# Patient Record
Sex: Female | Born: 1950
Health system: Southern US, Community
[De-identification: ages and names within clinical notes are randomized; demographics above are authoritative.]

## PROBLEM LIST (undated history)

## (undated) DIAGNOSIS — N289 Disorder of kidney and ureter, unspecified: Secondary | ICD-10-CM

## (undated) DIAGNOSIS — E079 Disorder of thyroid, unspecified: Secondary | ICD-10-CM

## (undated) DIAGNOSIS — K8689 Other specified diseases of pancreas: Secondary | ICD-10-CM

## (undated) DIAGNOSIS — C801 Malignant (primary) neoplasm, unspecified: Secondary | ICD-10-CM

## (undated) DIAGNOSIS — K219 Gastro-esophageal reflux disease without esophagitis: Secondary | ICD-10-CM

## (undated) DIAGNOSIS — C679 Malignant neoplasm of bladder, unspecified: Secondary | ICD-10-CM

## (undated) DIAGNOSIS — I10 Essential (primary) hypertension: Secondary | ICD-10-CM

## (undated) HISTORY — PX: NASAL SEPTUM SURGERY: SHX37

## (undated) HISTORY — PX: REPLACEMENT TOTAL KNEE BILATERAL: SUR1225

## (undated) HISTORY — DX: Malignant neoplasm of bladder, unspecified: C67.9

## (undated) HISTORY — PX: ABDOMINAL HYSTERECTOMY: SHX81

## (undated) HISTORY — PX: CYSTOSCOPY: SUR368

## (undated) HISTORY — PX: KIDNEY SURGERY: SHX687

## (undated) HISTORY — PX: BREAST BIOPSY: SHX20

## (undated) HISTORY — PX: CHOLECYSTECTOMY: SHX55

---

## 2017-04-03 ENCOUNTER — Emergency Department (HOSPITAL_BASED_OUTPATIENT_CLINIC_OR_DEPARTMENT_OTHER)
Admission: EM | Admit: 2017-04-03 | Discharge: 2017-04-04 | Disposition: A | Discharge status: Home / Self Care | Payer: Medicare Other | Attending: Emergency Medicine | Admitting: Emergency Medicine

## 2017-04-03 ENCOUNTER — Emergency Department (HOSPITAL_BASED_OUTPATIENT_CLINIC_OR_DEPARTMENT_OTHER): Payer: Medicare Other

## 2017-04-03 ENCOUNTER — Other Ambulatory Visit: Payer: Self-pay

## 2017-04-03 ENCOUNTER — Encounter (HOSPITAL_BASED_OUTPATIENT_CLINIC_OR_DEPARTMENT_OTHER): Payer: Self-pay | Admitting: Emergency Medicine

## 2017-04-03 DIAGNOSIS — Z79899 Other long term (current) drug therapy: Secondary | ICD-10-CM | POA: Insufficient documentation

## 2017-04-03 DIAGNOSIS — Z8551 Personal history of malignant neoplasm of bladder: Secondary | ICD-10-CM | POA: Diagnosis not present

## 2017-04-03 DIAGNOSIS — R42 Dizziness and giddiness: Secondary | ICD-10-CM | POA: Diagnosis not present

## 2017-04-03 HISTORY — DX: Disorder of thyroid, unspecified: E07.9

## 2017-04-03 HISTORY — DX: Malignant (primary) neoplasm, unspecified: C80.1

## 2017-04-03 LAB — COMPREHENSIVE METABOLIC PANEL
ALT: 14 U/L (ref 14–54)
AST: 16 U/L (ref 15–41)
Albumin: 4.1 g/dL (ref 3.5–5.0)
Alkaline Phosphatase: 101 U/L (ref 38–126)
Anion gap: 8 (ref 5–15)
BUN: 26 mg/dL — ABNORMAL HIGH (ref 6–20)
CO2: 26 mmol/L (ref 22–32)
Calcium: 9.5 mg/dL (ref 8.9–10.3)
Chloride: 107 mmol/L (ref 101–111)
Creatinine, Ser: 1.3 mg/dL — ABNORMAL HIGH (ref 0.44–1.00)
GFR calc Af Amer: 49 mL/min — ABNORMAL LOW (ref >60)
GFR calc non Af Amer: 42 mL/min — ABNORMAL LOW (ref >60)
Glucose, Bld: 106 mg/dL — ABNORMAL HIGH (ref 65–99)
Potassium: 4.2 mmol/L (ref 3.5–5.1)
Sodium: 141 mmol/L (ref 135–145)
Total Bilirubin: 0.6 mg/dL (ref 0.3–1.2)
Total Protein: 7.1 g/dL (ref 6.5–8.1)

## 2017-04-03 LAB — CBC WITH DIFFERENTIAL/PLATELET
Basophils Absolute: 0.1 10*3/uL (ref 0.0–0.1)
Basophils Relative: 1 %
Eosinophils Absolute: 0.5 10*3/uL (ref 0.0–0.7)
Eosinophils Relative: 5 %
HCT: 44.5 % (ref 36.0–46.0)
Hemoglobin: 14.6 g/dL (ref 12.0–15.0)
Lymphocytes Relative: 18 %
Lymphs Abs: 1.9 10*3/uL (ref 0.7–4.0)
MCH: 29.1 pg (ref 26.0–34.0)
MCHC: 32.8 g/dL (ref 30.0–36.0)
MCV: 88.8 fL (ref 78.0–100.0)
Monocytes Absolute: 0.7 10*3/uL (ref 0.1–1.0)
Monocytes Relative: 6 %
Neutro Abs: 7.4 10*3/uL (ref 1.7–7.7)
Neutrophils Relative %: 70 %
Platelets: 285 10*3/uL (ref 150–400)
RBC: 5.01 MIL/uL (ref 3.87–5.11)
RDW: 13.5 % (ref 11.5–15.5)
WBC: 10.5 10*3/uL (ref 4.0–10.5)

## 2017-04-03 LAB — URINALYSIS, ROUTINE W REFLEX MICROSCOPIC
Bilirubin Urine: NEGATIVE
Glucose, UA: NEGATIVE mg/dL
Ketones, ur: NEGATIVE mg/dL
Nitrite: NEGATIVE
Protein, ur: NEGATIVE mg/dL
Specific Gravity, Urine: 1.02 (ref 1.005–1.030)
pH: 5.5 (ref 5.0–8.0)

## 2017-04-03 LAB — URINALYSIS, MICROSCOPIC (REFLEX)

## 2017-04-03 LAB — I-STAT CG4 LACTIC ACID, ED: Lactic Acid, Venous: 0.8 mmol/L (ref 0.5–1.9)

## 2017-04-03 MED ORDER — MECLIZINE HCL 25 MG PO TABS
25.0000 mg | ORAL_TABLET | Freq: Once | ORAL | Status: AC
  Administered 2017-04-03: 25 mg via ORAL
  Filled 2017-04-03: qty 1

## 2017-04-03 MED ORDER — SODIUM CHLORIDE 0.9 % IV BOLUS (SEPSIS)
1000.0000 mL | Freq: Once | INTRAVENOUS | Status: AC
  Administered 2017-04-03: 1000 mL via INTRAVENOUS

## 2017-04-03 NOTE — ED Triage Notes (Signed)
Patient called ems because she was lighthead and dizzy x 2 -3 days. The patient is having generalized weakness and N/V

## 2017-04-03 NOTE — ED Notes (Signed)
Lactic results given to EDP.

## 2017-04-03 NOTE — ED Provider Notes (Signed)
[facility] EMERGENCY DEPARTMENT Provider Note  CSN: [id number] Arrival date & time: 04/03/17 1802  Chief Complaint(s) Dizziness  HPI Shelia Cruz is a 67 y.o. female   The history is provided by the patient.  Dizziness  Quality:  Lightheadedness Severity:  Moderate Onset quality:  Gradual Duration:  10 days Timing:  Intermittent Progression:  Waxing and waning Chronicity:  New Context: head movement and standing up   Relieved by:  Being still Worsened by:  Movement, eye movement, turning head and standing up Associated symptoms: diarrhea (NB), nausea and vomiting (NBNB)   Associated symptoms: no blood in stool, no chest pain, no shortness of breath and no syncope   Risk factors comment:  H/o bladder and renal CA currently on Chemo   Past Medical History Past Medical History:  Diagnosis Date  . Cancer [facility])    bladder cancer  . Thyroid disease    There are no active problems to display for this patient.  Home Medication(s) Prior to Admission medications   Medication Sig Start Date End Date Taking? Authorizing Provider  levothyroxine (SYNTHROID, LEVOTHROID) 100 MCG tablet Take 175 mcg by mouth daily before breakfast.    Yes [provider]  meclizine (ANTIVERT) 25 MG tablet Take 1 tablet (25 mg total) by mouth 3 (three) times daily as needed for dizziness. 04/04/17   Fatima Blank, MD  ondansetron (ZOFRAN ODT) 4 MG disintegrating tablet Take 1 tablet (4 mg total) by mouth every 8 (eight) hours as needed for up to 3 days for nausea or vomiting. 04/04/17 04/07/17  Fatima Blank, MD                                                                                                                                    Past Surgical History Past Surgical History:  Procedure Laterality Date  . ABDOMINAL HYSTERECTOMY    . CHOLECYSTECTOMY    . CYSTOSCOPY    . KIDNEY SURGERY    . NASAL SEPTUM SURGERY    . REPLACEMENT TOTAL KNEE BILATERAL      Family History History reviewed. No pertinent family history.  Social History Social History   Tobacco Use  . Smoking status: Never Smoker  . Smokeless tobacco: Never Used  Substance Use Topics  . Alcohol use: No    Frequency: Never  . Drug use: No   Allergies Patient has no known allergies.  Review of Systems Review of Systems  Constitutional: Positive for fatigue. Negative for chills and fever.  HENT: Negative for congestion.   Respiratory: Negative for shortness of breath.   Cardiovascular: Negative for chest pain and syncope.  Gastrointestinal: Positive for diarrhea (NB), nausea and vomiting (NBNB). Negative for blood in stool.  Genitourinary: Positive for dysuria.  Neurological: Positive for dizziness.  All other systems are reviewed and are negative for acute change except as noted in the HPI  Physical Exam Vital Signs  I  have reviewed the triage vital signs BP 135/86 (BP Location: Right Arm)   Temp 99 F (37.2 C) (Oral)   Resp 18   Ht 5' 6.5" (1.689 m)   Wt 97.5 kg (215 lb)   SpO2 100%   BMI 34.18 kg/m   Physical Exam  Constitutional: She is oriented to person, place, and time. She appears well-developed and well-nourished. No distress.  HENT:  Head: Normocephalic and atraumatic.  Nose: Nose normal.  Eyes: Conjunctivae and EOM are normal. Pupils are equal, round, and reactive to light. Right eye exhibits no discharge. Left eye exhibits no discharge. No scleral icterus.  Neck: Normal range of motion. Neck supple.  Cardiovascular: Normal rate and regular rhythm. Exam reveals no gallop and no friction rub.  No murmur heard. Pulmonary/Chest: Effort normal and breath sounds normal. No stridor. No respiratory distress. She has no rales.  Abdominal: Soft. She exhibits no distension. There is no tenderness.  Musculoskeletal: She exhibits no edema or tenderness.  Neurological: She is alert and oriented to person, place, and time.  Mental Status:  Alert and  oriented to person, place, and time.  Attention and concentration normal.  Speech clear.  Recent memory is intact  Cranial Nerves:  II Visual Fields: Intact to confrontation. Visual fields intact. III, IV, VI: Pupils equal and reactive to light and near. Full eye movement without nystagmus  V Facial Sensation: Normal. No weakness of masticatory muscles  VII: No facial weakness or asymmetry  VIII Auditory Acuity: Grossly normal  IX/X: The uvula is midline; the palate elevates symmetrically  XI: Normal sternocleidomastoid and trapezius strength  XII: The tongue is midline. No atrophy or fasciculations.   Motor System: Muscle Strength: 5/5 and symmetric in the upper and lower extremities. No pronation or drift.  Muscle Tone: Tone and muscle bulk are normal in the upper and lower extremities.   Reflexes: No Clonus Coordination: Intact finger-to-nose, heel-to-shin. No tremor.  Sensation: Intact to light touch, and pinprick. .  Gait: wide gait  HINTS: Nystagmus: no Head impulse: abnormal with impulse to the left Skew: normal    Skin: Skin is warm and dry. No rash noted. She is not diaphoretic. No erythema.  Psychiatric: She has a normal mood and affect.  Vitals reviewed.   ED Results and Treatments Labs (all labs ordered are listed, but only abnormal results are displayed) Labs Reviewed  COMPREHENSIVE METABOLIC PANEL - Abnormal; Notable for the following components:      Result Value   Glucose, Bld 106 (*)    BUN 26 (*)    Creatinine, Ser 1.30 (*)    GFR calc non Af Amer 42 (*)    GFR calc Af Amer 49 (*)    All other components within normal limits  URINALYSIS, ROUTINE W REFLEX MICROSCOPIC - Abnormal; Notable for the following components:   Hgb urine dipstick LARGE (*)    Leukocytes, UA TRACE (*)    All other components within normal limits  URINALYSIS, MICROSCOPIC (REFLEX) - Abnormal; Notable for the following components:   Bacteria, UA FEW (*)    Squamous Epithelial /  LPF 0-5 (*)    All other components within normal limits  CBC WITH DIFFERENTIAL/PLATELET  I-STAT CG4 LACTIC ACID, ED  EKG  EKG Interpretation  Date/Time:  Monday April 03 2017 18:36:06 EST Ventricular Rate:  67 PR Interval:  164 QRS Duration: 78 QT Interval:  358 QTC Calculation: 378 R Axis:   69 Text Interpretation:  Normal sinus rhythm Cannot rule out Anterior infarct , age undetermined Abnormal ECG NO STEMI No old tracing to compare Confirmed by Addison Lank [phone]) on 04/03/2017 6:41:19 PM      Radiology Ct Head Wo Contrast  Result Date: 04/03/2017 CLINICAL DATA:  Patient with confusion, dizziness, headache. EXAM: CT HEAD WITHOUT CONTRAST TECHNIQUE: Contiguous axial images were obtained from the base of the skull through the vertex without intravenous contrast. COMPARISON:  None. FINDINGS: Brain: Ventricles and sulci are appropriate for patient's age. Periventricular and subcortical white matter hypodensity compatible with microvascular ischemic changes. No evidence for acute cortically based infarct, intracranial hemorrhage, mass lesion or mass-effect. Vascular: Unremarkable. Skull: Intact. Sinuses/Orbits: Paranasal sinuses are unremarkable. Mastoid air cells unremarkable. Other: None. IMPRESSION: No acute intracranial process. Chronic microvascular ischemic changes. Electronically Signed   By: Lovey Newcomer M.D.   On: 04/03/2017 20:38   Pertinent labs & imaging results that were available during my care of the patient were reviewed by me and considered in my medical decision making (see chart for details).  Medications Ordered in ED Medications  sodium chloride 0.9 % bolus 1,000 mL (0 mLs Intravenous Stopped 04/03/17 2219)  sodium chloride 0.9 % bolus 1,000 mL (1,000 mLs Intravenous New Bag/Given 04/03/17 2219)  meclizine (ANTIVERT) tablet 25 mg (25 mg Oral Given  04/03/17 2217)                                                                                                                                    Procedures Procedures  (including critical care time)  Medical Decision Making / ED Course I have reviewed the nursing notes for this encounter and the patient's prior records (if available in EHR or on provided paperwork).    Positional vertigo. Reassuring HINTS exam, but given pt's h/o cancer, CT head was obtained. CT head negative. Labs with mild AKI likely due lack of PO intake (similar to prior on review of records), otherwise reassuring w/o UTI.   Provided with IVF and meclizine resulting in significant improvement.  Low suspicion for CVA.  She was able to ambulate w/o complication.  The patient appears reasonably screened and/or stabilized for discharge and I doubt any other medical condition or other [facility] requiring further screening, evaluation, or treatment in the ED at this time prior to discharge.  The patient is safe for discharge with strict return precautions.   Final Clinical Impression(s) / ED Diagnoses Final diagnoses:  Vertigo    Disposition: Discharge  Condition: Good  I have discussed the results, Dx and Tx plan with the patient and daughter who expressed understanding and agree(s) with the plan. Discharge instructions discussed at great length. The patient and daughter were given  strict return precautions who verbalized understanding of the instructions. No further questions at time of discharge.    ED Discharge Orders        Ordered    meclizine (ANTIVERT) 25 MG tablet  3 times daily PRN     04/04/17 0002    ondansetron (ZOFRAN ODT) 4 MG disintegrating tablet  Every 8 hours PRN     04/04/17 0002       Follow Up: Primary care provider  Schedule an appointment as soon as possible for a visit  If symptoms do not improve or  worsen     This chart was dictated using voice recognition software.  Despite  best efforts to proofread,  errors can occur which can change the documentation meaning.   Fatima Blank, MD 04/04/17 0003

## 2017-04-04 MED ORDER — ONDANSETRON 4 MG PO TBDP: 4.0000 mg | ORAL_TABLET | Freq: Three times a day (TID) | ORAL | 0 refills | Status: AC | PRN

## 2017-04-04 MED ORDER — MECLIZINE HCL 25 MG PO TABS: 25.0000 mg | ORAL_TABLET | Freq: Three times a day (TID) | ORAL | 0 refills | Status: DC | PRN

## 2017-04-08 ENCOUNTER — Emergency Department (HOSPITAL_COMMUNITY): Payer: Medicare Other

## 2017-04-08 ENCOUNTER — Encounter (HOSPITAL_COMMUNITY): Payer: Self-pay | Admitting: *Deleted

## 2017-04-08 ENCOUNTER — Emergency Department (HOSPITAL_COMMUNITY)
Admission: EM | Admit: 2017-04-08 | Discharge: 2017-04-08 | Disposition: A | Discharge status: Home / Self Care | Payer: Medicare Other | Attending: Emergency Medicine | Admitting: Emergency Medicine

## 2017-04-08 DIAGNOSIS — Z96653 Presence of artificial knee joint, bilateral: Secondary | ICD-10-CM | POA: Insufficient documentation

## 2017-04-08 DIAGNOSIS — R1032 Left lower quadrant pain: Secondary | ICD-10-CM | POA: Diagnosis present

## 2017-04-08 DIAGNOSIS — K5792 Diverticulitis of intestine, part unspecified, without perforation or abscess without bleeding: Secondary | ICD-10-CM | POA: Insufficient documentation

## 2017-04-08 DIAGNOSIS — Z79899 Other long term (current) drug therapy: Secondary | ICD-10-CM | POA: Insufficient documentation

## 2017-04-08 HISTORY — DX: Disorder of kidney and ureter, unspecified: N28.9

## 2017-04-08 LAB — COMPREHENSIVE METABOLIC PANEL
ALT: 17 U/L (ref 14–54)
AST: 20 U/L (ref 15–41)
Albumin: 3.7 g/dL (ref 3.5–5.0)
Alkaline Phosphatase: 94 U/L (ref 38–126)
Anion gap: 11 (ref 5–15)
BUN: 21 mg/dL — ABNORMAL HIGH (ref 6–20)
CO2: 24 mmol/L (ref 22–32)
Calcium: 9.2 mg/dL (ref 8.9–10.3)
Chloride: 104 mmol/L (ref 101–111)
Creatinine, Ser: 1.45 mg/dL — ABNORMAL HIGH (ref 0.44–1.00)
GFR calc Af Amer: 43 mL/min — ABNORMAL LOW (ref >60)
GFR calc non Af Amer: 37 mL/min — ABNORMAL LOW (ref >60)
Glucose, Bld: 144 mg/dL — ABNORMAL HIGH (ref 65–99)
Potassium: 4.3 mmol/L (ref 3.5–5.1)
Sodium: 139 mmol/L (ref 135–145)
Total Bilirubin: 1.4 mg/dL — ABNORMAL HIGH (ref 0.3–1.2)
Total Protein: 6.3 g/dL — ABNORMAL LOW (ref 6.5–8.1)

## 2017-04-08 LAB — CBC
HCT: 42.3 % (ref 36.0–46.0)
Hemoglobin: 13.7 g/dL (ref 12.0–15.0)
MCH: 29.5 pg (ref 26.0–34.0)
MCHC: 32.4 g/dL (ref 30.0–36.0)
MCV: 91.2 fL (ref 78.0–100.0)
Platelets: 251 10*3/uL (ref 150–400)
RBC: 4.64 MIL/uL (ref 3.87–5.11)
RDW: 13.6 % (ref 11.5–15.5)
WBC: 14.1 10*3/uL — ABNORMAL HIGH (ref 4.0–10.5)

## 2017-04-08 LAB — URINALYSIS, ROUTINE W REFLEX MICROSCOPIC
Bacteria, UA: NONE SEEN
Bilirubin Urine: NEGATIVE
Glucose, UA: NEGATIVE mg/dL
Hgb urine dipstick: NEGATIVE
Ketones, ur: NEGATIVE mg/dL
Nitrite: NEGATIVE
Protein, ur: NEGATIVE mg/dL
RBC / HPF: NONE SEEN RBC/hpf (ref 0–5)
Specific Gravity, Urine: 1.013 (ref 1.005–1.030)
Squamous Epithelial / LPF: NONE SEEN
WBC, UA: NONE SEEN WBC/hpf (ref 0–5)
pH: 5 (ref 5.0–8.0)

## 2017-04-08 LAB — LIPASE, BLOOD: Lipase: 23 U/L (ref 11–51)

## 2017-04-08 MED ORDER — ONDANSETRON HCL 4 MG/2ML IJ SOLN
4.0000 mg | Freq: Once | INTRAMUSCULAR | Status: AC
  Administered 2017-04-08: 4 mg via INTRAVENOUS
  Filled 2017-04-08: qty 2

## 2017-04-08 MED ORDER — CIPROFLOXACIN HCL 500 MG PO TABS: 500.0000 mg | ORAL_TABLET | Freq: Two times a day (BID) | ORAL | 0 refills | Status: DC

## 2017-04-08 MED ORDER — IOPAMIDOL (ISOVUE-300) INJECTION 61%
INTRAVENOUS | Status: AC
  Administered 2017-04-08: 100 mL via INTRAVENOUS
  Filled 2017-04-08: qty 100

## 2017-04-08 MED ORDER — SODIUM CHLORIDE 0.9 % IV BOLUS (SEPSIS)
1000.0000 mL | Freq: Once | INTRAVENOUS | Status: AC
  Administered 2017-04-08: 1000 mL via INTRAVENOUS

## 2017-04-08 MED ORDER — CIPROFLOXACIN HCL 500 MG PO TABS
500.0000 mg | ORAL_TABLET | Freq: Once | ORAL | Status: AC
  Administered 2017-04-08: 500 mg via ORAL
  Filled 2017-04-08: qty 1

## 2017-04-08 MED ORDER — METRONIDAZOLE 500 MG PO TABS: 500.0000 mg | ORAL_TABLET | Freq: Two times a day (BID) | ORAL | 0 refills | Status: DC

## 2017-04-08 MED ORDER — TRAMADOL HCL 50 MG PO TABS: 50.0000 mg | ORAL_TABLET | Freq: Four times a day (QID) | ORAL | 0 refills | Status: DC | PRN

## 2017-04-08 MED ORDER — MORPHINE SULFATE (PF) 4 MG/ML IV SOLN
4.0000 mg | Freq: Once | INTRAVENOUS | Status: AC
  Administered 2017-04-08: 4 mg via INTRAVENOUS
  Filled 2017-04-08: qty 1

## 2017-04-08 MED ORDER — ONDANSETRON HCL 4 MG PO TABS: 4.0000 mg | ORAL_TABLET | Freq: Three times a day (TID) | ORAL | 0 refills | Status: DC | PRN

## 2017-04-08 MED ORDER — METRONIDAZOLE 500 MG PO TABS
500.0000 mg | ORAL_TABLET | Freq: Once | ORAL | Status: AC
  Administered 2017-04-08: 500 mg via ORAL
  Filled 2017-04-08: qty 1

## 2017-04-08 NOTE — ED Triage Notes (Signed)
To ED for eval of LLQ pain for past few days. States she is a chemo pain and was unable to get her treatment yesterday due to not feeling well - states she then saw pcp for n/v/d and told to come to ED for treatment of chemo side effects vs possible diverticulitis as hx of.

## 2017-04-08 NOTE — ED Notes (Signed)
Patient transported to CT 

## 2017-04-08 NOTE — ED Provider Notes (Signed)
[place] [facility] EMERGENCY DEPARTMENT Provider Note   CSN: [id number] Arrival date & time: 04/08/17  1129     History   Chief Complaint Chief Complaint  Patient presents with  . Abdominal Pain  . Emesis  . Diarrhea    HPI Shelia Cruz is a 67 y.o. female with a history of bladder cancer with metastasis to her kidneys currently undergoing chemotherapy by Dr. Rosana Hoes who presents the emergency department today for left lower quadrant abdominal pain with associated nausea/vomiting/diarrhea.  Patient states that she began having intermittent left lower quadrant abdominal pain since she was diagnosed with bladder cancer in 2017.  She was told at that time that it was her "bladder collapsing in on itself".  She notes that she started developing intermittent episodes of nausea and nonbilious, nonbloody emesis since November.  She notes that over the last 3 weeks that her pain in the left lower quadrant has become constant, as a dull, achy pain in her left lower quadrant without radiation.  She notes that over the last 3 weeks she has also started having 4 episodes of nonbloody diarrhea per day.  She notes that her stools are normally black in color due to iron supplements.  She was seen in the ED a few days ago for dehydration/vertigo felt better after fluids.  Repeat of the symptoms.  She saw her oncologist yesterday but did not undergo chemotherapy because she was not feeling well.  She feels her symptoms have worsened today in the left lower quadrant abdominal pain has become crampy in nature.  The pain is worse with movement and coughing.  She was seen at walk-in clinic and given a shot of pain medication as well as Zofran that helped with her symptoms.  Patient states that she was told to come here for further evaluation by a CT scan.  Patient notes that she has chronic dysuria since her chemotherapy in 2017 and is incontinent.  Her last catheterization was in December after  undergoing kidney surgery for cancer.  She has had no other change in urinary symptoms.  She denies any fever, chills, chest pain, shortness of breath, flank pain.  Patient states she has had a abdominal hysterectomy with bilateral oophorectomy in the past.  She has also had a cholecystectomy. LBM last night. Patient last colonoscopy 4 years ago which she reports having a few polyps removed and diverticulosis. Scheduled for next colonoscopy next year.   HPI  Past Medical History:  Diagnosis Date  . Cancer [facility])    bladder cancer  . Renal disorder   . Thyroid disease     There are no active problems to display for this patient.   Past Surgical History:  Procedure Laterality Date  . ABDOMINAL HYSTERECTOMY    . CHOLECYSTECTOMY    . CYSTOSCOPY    . KIDNEY SURGERY    . NASAL SEPTUM SURGERY    . REPLACEMENT TOTAL KNEE BILATERAL      OB History    No data available       Home Medications    Prior to Admission medications   Medication Sig Start Date End Date Taking? Authorizing Provider  levothyroxine (SYNTHROID, LEVOTHROID) 100 MCG tablet Take 175 mcg by mouth daily before breakfast.     [provider]  meclizine (ANTIVERT) 25 MG tablet Take 1 tablet (25 mg total) by mouth 3 (three) times daily as needed for dizziness. 04/04/17   Fatima Blank, MD    Family History No  family history on file.  Social History Social History   Tobacco Use  . Smoking status: Never Smoker  . Smokeless tobacco: Never Used  Substance Use Topics  . Alcohol use: No    Frequency: Never  . Drug use: No     Allergies   Patient has no known allergies.   Review of Systems Review of Systems  All other systems reviewed and are negative.    Physical Exam Updated Vital Signs BP 106/61   Pulse 65   Temp 98.7 F (37.1 C) (Oral)   Resp 16   SpO2 94%   Physical Exam  Constitutional: She appears well-developed and well-nourished.  HENT:  Head: Normocephalic and  atraumatic.  Right Ear: External ear normal.  Left Ear: External ear normal.  Nose: Nose normal.  Mouth/Throat: Uvula is midline, oropharynx is clear and moist and mucous membranes are normal. No tonsillar exudate.  Eyes: Pupils are equal, round, and reactive to light. Right eye exhibits no discharge. Left eye exhibits no discharge. No scleral icterus.  Neck: Trachea normal. Neck supple. No spinous process tenderness present. No neck rigidity. Normal range of motion present.  Cardiovascular: Normal rate, regular rhythm and intact distal pulses.  No murmur heard. Pulses:      Radial pulses are 2+ on the right side, and 2+ on the left side.       Dorsalis pedis pulses are 2+ on the right side, and 2+ on the left side.       Posterior tibial pulses are 2+ on the right side, and 2+ on the left side.  No lower extremity swelling or edema. Calves symmetric in size bilaterally.  Pulmonary/Chest: Effort normal and breath sounds normal. She exhibits no tenderness.  Abdominal: Soft. Bowel sounds are normal. She exhibits no distension. There is tenderness in the suprapubic area and left lower quadrant. There is no rigidity, no rebound, no guarding and no CVA tenderness.  Musculoskeletal: She exhibits no edema.  Lymphadenopathy:    She has no cervical adenopathy.  Neurological: She is alert.  Skin: Skin is warm and dry. No rash noted. She is not diaphoretic.  Psychiatric: She has a normal mood and affect.  Nursing note and vitals reviewed.    ED Treatments / Results  Labs (all labs ordered are listed, but only abnormal results are displayed) Labs Reviewed  COMPREHENSIVE METABOLIC PANEL - Abnormal; Notable for the following components:      Result Value   Glucose, Bld 144 (*)    BUN 21 (*)    Creatinine, Ser 1.45 (*)    Total Protein 6.3 (*)    Total Bilirubin 1.4 (*)    GFR calc non Af Amer 37 (*)    GFR calc Af Amer 43 (*)    All other components within normal limits  CBC - Abnormal;  Notable for the following components:   WBC 14.1 (*)    All other components within normal limits  URINALYSIS, ROUTINE W REFLEX MICROSCOPIC - Abnormal; Notable for the following components:   Leukocytes, UA TRACE (*)    All other components within normal limits  LIPASE, BLOOD    EKG  EKG Interpretation None       Radiology Ct Abdomen Pelvis W Contrast  Result Date: 04/08/2017 CLINICAL DATA:  67 year old female with history of left lower quadrant abdominal pain. History of bladder cancer with metastatic disease to the kidneys. Nausea, vomiting and diarrhea over the past several months, worsening in the past couple of days.  EXAM: CT ABDOMEN AND PELVIS WITH CONTRAST TECHNIQUE: Multidetector CT imaging of the abdomen and pelvis was performed using the standard protocol following bolus administration of intravenous contrast. CONTRAST:  175mL ISOVUE-300 IOPAMIDOL (ISOVUE-300) INJECTION 61% COMPARISON:  No priors. FINDINGS: Lower chest: Mild cardiomegaly. Hepatobiliary: No cystic or solid hepatic lesions. No intra or extrahepatic biliary ductal dilatation. Status post cholecystectomy. Pancreas: No pancreatic mass. No pancreatic ductal dilatation. No pancreatic or peripancreatic fluid or inflammatory changes. Spleen: Unremarkable. Adrenals/Urinary Tract: Bilateral kidneys and bilateral adrenal glands are normal in appearance. No hydroureteronephrosis. Urinary bladder is normal in appearance. Stomach/Bowel: The appearance of the stomach is normal. There is no pathologic dilatation of small bowel or colon. Numerous colonic diverticulae are noted, and in the region of the proximal sigmoid colon there are extensive surrounding inflammatory changes indicative of an acute diverticulitis. No discrete diverticular abscess or signs of frank perforation are identified at this time. Normal appendix. Vascular/Lymphatic: Aortic atherosclerosis, without evidence of aneurysm or dissection in the abdominal or pelvic  vasculature. No lymphadenopathy noted in the abdomen or pelvis. Reproductive: Status post hysterectomy. Ovaries are not confidently identified may be surgically absent or atrophic. Other: No significant volume of ascites.  No pneumoperitoneum. Musculoskeletal: There are no aggressive appearing lytic or blastic lesions noted in the visualized portions of the skeleton. IMPRESSION: 1. Acute sigmoid diverticulitis. No diverticular abscess or signs of frank perforation are noted at this time. 2. Aortic atherosclerosis. 3. Mild cardiomegaly. Electronically Signed   By: Vinnie Langton M.D.   On: 04/08/2017 17:18    Procedures Procedures (including critical care time)  Medications Ordered in ED Medications  morphine 4 MG/ML injection 4 mg (not administered)  ondansetron (ZOFRAN) injection 4 mg (4 mg Intravenous Given 04/08/17 1547)  sodium chloride 0.9 % bolus 1,000 mL (1,000 mLs Intravenous New Bag/Given 04/08/17 1548)  morphine 4 MG/ML injection 4 mg (4 mg Intravenous Given 04/08/17 1547)  iopamidol (ISOVUE-300) 61 % injection (100 mLs Intravenous Contrast Given 04/08/17 1623)     Initial Impression / Assessment and Plan / ED Course  I have reviewed the triage vital signs and the nursing notes.  Pertinent labs & imaging results that were available during my care of the patient were reviewed by me and considered in my medical decision making (see chart for details).      67 y.o. female with a history of bladder cancer with metastasis to her kidneys currently undergoing chemotherapy by Dr. Rosana Hoes who presenting with left lower quadrant abdominal pain with associated nausea/vomiting/diarrhea. Over the last 3 weeks patient notes LLQ abdominal pain has become more constant, and she started developing diarrhea 4 times per day. Patient missed chemo yesterday because she was not feeling well. Was told to come her for further evaluation. Patient was seen prior to my evaluation with blood work and Ct scan  ordered. Patient was given IVF, zofran and morphine for her symptoms.  On presentation the patient is without any fever, tachycardia, tachypnea, hypoxia or hypotension.  She appears non-ill and nonseptic appearing.  She does have left lower quadrant and suprapubic tenderness. No peritoneal signs. Patient has prior history of hysterectomy & oophorectomy bilaterally. No vaginal discharge or pain.  Do not feel she needs pelvic to evaluate for pathology.   Patient notes urinary symptoms at baseline. UA without evidence of UTI. Lipase wnl. Normal electrolytes. Creatinine and BUN slightly elevated from baseline and c/w dehydration. IVF being given. Glucose 144 but no anion gap acidosis. CBC with leukocytosis of 14.1 but otherwise  unremarkable. CT with acute sigmoid diverticulitis. No abscess or evidence of perforation.  Patient's pain and nausea control in the department.  She is able to tolerate p.o. fluids.  Patient given first dose of Cipro and Flagyl in the department.  Counseled risk of tendon injury while on ciprofloxacin and advised patient not to drink alcohol while taking Flagyl.  Patient will be discharged home on Cipro and Flagyl, short course of pain medication and Zofran for nausea.  I advised the patient to follow-up with PCP vs GI this week. Specific return precautions discussed. Time was given for all questions to be answered. The patient verbalized understanding and agreement with plan. The patient appears safe for discharge home.  Final Clinical Impressions(s) / ED Diagnoses   Final diagnoses:  Diverticulitis    ED Discharge Orders    None       Lorelle Gibbs 04/08/17 1804    Pattricia Boss, MD 04/09/17 2227

## 2017-04-08 NOTE — Discharge Instructions (Signed)
Your seen here today for left lower quadrant abdominal pain.  Your CT scan shows that you have diverticulitis.  Please see attached handout.  I am prescribing you ciprofloxacin and Flagyl for this. Please take all of your antibiotics until finished!   You may develop abdominal discomfort or diarrhea from the antibiotic.  You may help offset this with probiotics which you can buy or get in yogurt. Do not eat or take the probiotics until 2 hours after your antibiotic. Do not take your medicine if develop an itchy rash, swelling in your mouth or lips, or difficulty breathing. Note that the prophylaxis and can pose you at risk for tendon rupture.  Please do not lift heavy weight while on this medication.  Please do not drink alcohol while on Flagyl as this can make you very ill.  Please follow-up with your PCP versus GI in the following week for reevaluation.  For pain control you may take: 800mg  of ibuprofen (that is usually four 200mg  over the counter pills) up to 3 times a day (please take with food) and acetaminophen 975mg  (this is 3 normal strength, 325mg , over the counter pills) up to four times a day. Please do not take more than this. Do not drink alcohol or combine with other medications that have acetaminophen or Ibuprofen as an ingredient (Read the labels!).    For breakthrough pain you may take Ultram. Do not drink alcohol drive or operate heavy machinery when taking. You are being provided a prescription for opiates (also known as narcotics) for pain control on an ?as needed? basis.  Opiates can be addictive and should only be used when absolutely necessary for pain control when other alternatives do not work.  We recommend you only use them for the recommended amount of time and only as prescribed.  Please do not take with other sedative medications or alcohol.  Please do not drive, operate machinery, or make important decisions while taking opiates.  Please note that these medications can be addictive  and have high abuse potential.  Please keep these medications locked away from children, teenagers or any family members with history of substance abuse. Additionally, these medications may cause constipation - take over the counter stool softeners or add fiber to your diet to treat this (Metamucil, Psyllium Fiber, Colace, Miralax) Further refills will need to be obtained from your primary care doctor and will not be prescribed through the Emergency Department. You will test positive on most drug tests while taking this medication.

## 2017-04-08 NOTE — ED Notes (Signed)
ED Provider at bedside. 

## 2017-04-08 NOTE — ED Provider Notes (Signed)
MSE was initiated and I personally evaluated the patient and placed orders (if any) at  3:03 PM on January 71, 6390.  67 year old with LLQ abdominal pain. PMHx of bladder cancer with mets to kidneys. She reports feeling very poor over the past couple weeks. She has had N/V/D for several months but worse in the past couple days. She was seen in the ED a couple days ago for dehydration/vertigo and felt better after fluids. She saw her oncologist yesterday but was not given chemo because she was feeling so bad. She reports the LLQ pain has been going on for a while but much worse today. It is sharp and stabbing in the LLQ. It is constant and worse with movement and coughing. She was advised to come to the ED for a CT scan.  Vital signs reviewed are normal. On exam, heart is regular rate and rhythm. Lungs are CTA. Abdomen is soft and she is significantly tender in the LLQ. Orders for fluids, zofran, pain medicine, and CT of abdomen and pelvis were ordered.   The patient appears stable so that the remainder of the MSE may be completed by another provider.    Recardo Evangelist, PA-C 04/08/17 1503    Julianne Rice, MD 04/09/17 [phone]

## 2017-06-07 ENCOUNTER — Other Ambulatory Visit: Payer: Self-pay | Admitting: Urology

## 2017-06-07 DIAGNOSIS — R3989 Other symptoms and signs involving the genitourinary system: Secondary | ICD-10-CM

## 2017-06-07 DIAGNOSIS — R109 Unspecified abdominal pain: Secondary | ICD-10-CM

## 2017-06-13 ENCOUNTER — Ambulatory Visit
Admission: RE | Admit: 2017-06-13 | Discharge: 2017-06-13 | Disposition: A | Discharge status: Home / Self Care | Payer: Medicare Other | Source: Ambulatory Visit | Attending: Urology | Admitting: Urology

## 2017-06-13 DIAGNOSIS — R3989 Other symptoms and signs involving the genitourinary system: Secondary | ICD-10-CM

## 2017-06-13 DIAGNOSIS — R109 Unspecified abdominal pain: Secondary | ICD-10-CM

## 2017-06-28 ENCOUNTER — Other Ambulatory Visit: Payer: Self-pay | Admitting: Family Medicine

## 2017-06-28 DIAGNOSIS — E2839 Other primary ovarian failure: Secondary | ICD-10-CM

## 2017-06-28 DIAGNOSIS — Z1231 Encounter for screening mammogram for malignant neoplasm of breast: Secondary | ICD-10-CM

## 2017-08-30 ENCOUNTER — Other Ambulatory Visit: Payer: Self-pay | Admitting: Family Medicine

## 2017-08-30 DIAGNOSIS — N644 Mastodynia: Secondary | ICD-10-CM

## 2017-08-30 DIAGNOSIS — N6320 Unspecified lump in the left breast, unspecified quadrant: Secondary | ICD-10-CM

## 2017-08-31 ENCOUNTER — Other Ambulatory Visit: Payer: Self-pay | Admitting: Family Medicine

## 2017-08-31 DIAGNOSIS — N644 Mastodynia: Secondary | ICD-10-CM

## 2017-10-25 ENCOUNTER — Other Ambulatory Visit: Payer: Medicare Other

## 2018-02-26 ENCOUNTER — Ambulatory Visit
Admission: RE | Admit: 2018-02-26 | Discharge: 2018-02-26 | Disposition: A | Discharge status: Home / Self Care | Payer: Medicare Other | Source: Ambulatory Visit | Attending: Family Medicine | Admitting: Family Medicine

## 2018-02-26 ENCOUNTER — Other Ambulatory Visit: Payer: Medicare Other

## 2018-02-26 DIAGNOSIS — N644 Mastodynia: Secondary | ICD-10-CM

## 2018-02-26 DIAGNOSIS — E2839 Other primary ovarian failure: Secondary | ICD-10-CM

## 2018-06-21 ENCOUNTER — Emergency Department (HOSPITAL_BASED_OUTPATIENT_CLINIC_OR_DEPARTMENT_OTHER): Payer: Medicare Other

## 2018-06-21 ENCOUNTER — Emergency Department (HOSPITAL_BASED_OUTPATIENT_CLINIC_OR_DEPARTMENT_OTHER)
Admission: EM | Admit: 2018-06-21 | Discharge: 2018-06-22 | Disposition: A | Discharge status: Home / Self Care | Payer: Medicare Other | Attending: Emergency Medicine | Admitting: Emergency Medicine

## 2018-06-21 ENCOUNTER — Other Ambulatory Visit: Payer: Self-pay

## 2018-06-21 DIAGNOSIS — Z7982 Long term (current) use of aspirin: Secondary | ICD-10-CM | POA: Insufficient documentation

## 2018-06-21 DIAGNOSIS — Z79899 Other long term (current) drug therapy: Secondary | ICD-10-CM | POA: Insufficient documentation

## 2018-06-21 DIAGNOSIS — Y999 Unspecified external cause status: Secondary | ICD-10-CM | POA: Diagnosis not present

## 2018-06-21 DIAGNOSIS — S92352A Displaced fracture of fifth metatarsal bone, left foot, initial encounter for closed fracture: Secondary | ICD-10-CM | POA: Diagnosis not present

## 2018-06-21 DIAGNOSIS — Y9301 Activity, walking, marching and hiking: Secondary | ICD-10-CM | POA: Diagnosis not present

## 2018-06-21 DIAGNOSIS — S99912A Unspecified injury of left ankle, initial encounter: Secondary | ICD-10-CM | POA: Diagnosis present

## 2018-06-21 DIAGNOSIS — Y92480 Sidewalk as the place of occurrence of the external cause: Secondary | ICD-10-CM | POA: Insufficient documentation

## 2018-06-21 DIAGNOSIS — Z8551 Personal history of malignant neoplasm of bladder: Secondary | ICD-10-CM | POA: Diagnosis not present

## 2018-06-21 DIAGNOSIS — W101XXA Fall (on)(from) sidewalk curb, initial encounter: Secondary | ICD-10-CM | POA: Insufficient documentation

## 2018-06-21 MED ORDER — MORPHINE SULFATE (PF) 4 MG/ML IV SOLN
4.0000 mg | Freq: Once | INTRAVENOUS | Status: AC
  Administered 2018-06-21: 4 mg via INTRAVENOUS
  Filled 2018-06-21: qty 1

## 2018-06-21 MED ORDER — ONDANSETRON HCL 4 MG/2ML IJ SOLN
4.0000 mg | Freq: Once | INTRAMUSCULAR | Status: AC
  Administered 2018-06-21: 4 mg via INTRAVENOUS
  Filled 2018-06-21: qty 2

## 2018-06-21 NOTE — ED Notes (Signed)
Ice applied to left ankle.

## 2018-06-21 NOTE — ED Notes (Signed)
ED Provider at bedside. 

## 2018-06-21 NOTE — ED Triage Notes (Signed)
Pt Stepped off curb injured left ankle.  around 1800 tonight. Pt tried to take tramadol at home, pain increased EMS transport. Swelling. Left ankle. Pedal pulse positive.

## 2018-06-22 MED ORDER — HYDROCODONE-ACETAMINOPHEN 5-325 MG PO TABS
1.0000 | ORAL_TABLET | Freq: Once | ORAL | Status: AC
  Administered 2018-06-22: 02:00:00 1 via ORAL
  Filled 2018-06-22: qty 1

## 2018-06-22 MED ORDER — HYDROCODONE-ACETAMINOPHEN 5-325 MG PO TABS: 1.0000 | ORAL_TABLET | ORAL | 0 refills | Status: DC | PRN

## 2018-06-22 NOTE — ED Provider Notes (Signed)
[place] EMERGENCY DEPARTMENT Provider Note   CSN: [id number] Arrival date & time: 06/21/18  2256    History   Chief Complaint Chief Complaint  Patient presents with  . Ankle Injury    HPI Shelia Cruz is a 68 y.o. female.     Patient presents to the emergency department for evaluation of foot and ankle pain after injury.  She reports that she stepped off of a curb and twisted her ankle.  Her foot and ankle rolled up under her and she fell to the ground.  She was able to catch herself and did not injure anything else, but now is experiencing severe ankle and foot pain since the injury.  She cannot bear any weight.     Past Medical History:  Diagnosis Date  . Cancer [facility])    bladder cancer  . Renal disorder   . Thyroid disease     There are no active problems to display for this patient.   Past Surgical History:  Procedure Laterality Date  . ABDOMINAL HYSTERECTOMY    . BREAST BIOPSY Left    benign  . CHOLECYSTECTOMY    . CYSTOSCOPY    . KIDNEY SURGERY    . NASAL SEPTUM SURGERY    . REPLACEMENT TOTAL KNEE BILATERAL       OB History   No obstetric history on file.      Home Medications    Prior to Admission medications   Medication Sig Start Date End Date Taking? Authorizing Provider  aspirin EC 81 MG tablet Take by mouth.    [provider]  calcitRIOL (ROCALTROL) 0.5 MCG capsule Take by mouth.    [provider]  Cholecalciferol (VITAMIN D3) 50 MCG (2000 UT) capsule Take 1 tablet by mouth daily on Sunday, Tuesday, Thursday and Saturday    [provider]  cycloSPORINE (RESTASIS) 0.05 % ophthalmic emulsion Place 1 drop into both eyes 2 times daily.    [provider]  DULoxetine (CYMBALTA) 30 MG capsule Take by mouth.    [provider]  fluticasone (FLONASE) 50 MCG/ACT nasal spray Place into the nose.    [provider]  levothyroxine (SYNTHROID, LEVOTHROID) 100 MCG tablet Take 175 mcg  by mouth daily before breakfast.     [provider]  losartan (COZAAR) 100 MG tablet Take by mouth.    [provider]  meclizine (ANTIVERT) 25 MG tablet Take 1 tablet (25 mg total) by mouth 3 (three) times daily as needed for dizziness. 04/04/17   Fatima Blank, MD  metoprolol succinate (TOPROL-XL) 50 MG 24 hr tablet Take by mouth.    [provider]  metroNIDAZOLE (FLAGYL) 500 MG tablet Take 1 tablet (500 mg total) by mouth 2 (two) times daily with a meal. DO NOT CONSUME ALCOHOL WHILE TAKING THIS MEDICATION. 04/08/17   Maczis, Barth Kirks, PA-C  ondansetron (ZOFRAN) 4 MG tablet Take 1 tablet (4 mg total) by mouth every 8 (eight) hours as needed for nausea or vomiting. 04/08/17   Maczis, Barth Kirks, PA-C  rOPINIRole (REQUIP) 2 MG tablet Take by mouth.    [provider]  traMADol (ULTRAM) 50 MG tablet Take 1 tablet (50 mg total) by mouth every 6 (six) hours as needed. 04/08/17   Maczis, Barth Kirks, PA-C  traZODone (DESYREL) 150 MG tablet Take by mouth.    [provider]    Family History No family history on file.  Social History Social History   Tobacco Use  .  Smoking status: Never Smoker  . Smokeless tobacco: Never Used  Substance Use Topics  . Alcohol use: No    Frequency: Never  . Drug use: No     Allergies   Iodinated diagnostic agents; Sulfamethoxazole-trimethoprim; and Ciprofloxacin   Review of Systems Review of Systems  Musculoskeletal: Positive for arthralgias.  Neurological: Positive for numbness (toes).     Physical Exam Updated Vital Signs BP 129/72 (BP Location: Left Arm)   Pulse 72   Temp 98.4 F (36.9 C) (Oral)   Resp 18   Ht 5\' 6"  (1.676 m)   Wt 86.6 kg   SpO2 96%   BMI 30.83 kg/m   Physical Exam Constitutional:      Appearance: Normal appearance.  HENT:     Head: Normocephalic and atraumatic.  Neck:     Musculoskeletal: Normal range of motion and neck supple.  Cardiovascular:     Rate and  Rhythm: Normal rate and regular rhythm.     Pulses:          Dorsalis pedis pulses are 1+ on the left side.  Pulmonary:     Effort: Pulmonary effort is normal.     Breath sounds: Normal breath sounds.  Abdominal:     Tenderness: There is no abdominal tenderness.  Musculoskeletal:     Left ankle: She exhibits swelling. She exhibits no ecchymosis, no deformity, no laceration and normal pulse. Tenderness. Achilles tendon normal.     Left foot: Bony tenderness present. No deformity.       Feet:  Skin:    Findings: No abrasion, bruising, ecchymosis, erythema or laceration.  Neurological:     Mental Status: She is alert.     Sensory: Sensation is intact.     Motor: Motor function is intact.      ED Treatments / Results  Labs (all labs ordered are listed, but only abnormal results are displayed) Labs Reviewed - No data to display  EKG None  Radiology Dg Ankle Complete Left  Result Date: 06/22/2018 CLINICAL DATA:  68 year old female with trauma to the left foot and ankle. EXAM: LEFT ANKLE COMPLETE - 3+ VIEW; LEFT FOOT - COMPLETE 3+ VIEW COMPARISON:  None. FINDINGS: There is a minimally displaced oblique fracture of the fifth metatarsal. No other acute fracture identified. There is no dislocation. The bones are osteopenic. The ankle mortise is intact. Evidence of prior surgery of the talus with imbedded hardware. There is mild soft tissue swelling of the lateral ankle. IMPRESSION: Minimally displaced oblique fracture of the fifth metatarsal. Electronically Signed   By: Anner Crete M.D.   On: 06/22/2018 00:04   Dg Foot Complete Left  Result Date: 06/22/2018 CLINICAL DATA:  68 year old female with trauma to the left foot and ankle. EXAM: LEFT ANKLE COMPLETE - 3+ VIEW; LEFT FOOT - COMPLETE 3+ VIEW COMPARISON:  None. FINDINGS: There is a minimally displaced oblique fracture of the fifth metatarsal. No other acute fracture identified. There is no dislocation. The bones are osteopenic. The  ankle mortise is intact. Evidence of prior surgery of the talus with imbedded hardware. There is mild soft tissue swelling of the lateral ankle. IMPRESSION: Minimally displaced oblique fracture of the fifth metatarsal. Electronically Signed   By: Anner Crete M.D.   On: 06/22/2018 00:04    Procedures Procedures (including critical care time)  Medications Ordered in ED Medications  morphine 4 MG/ML injection 4 mg (4 mg Intravenous Given 06/21/18 2342)  ondansetron (ZOFRAN) injection 4 mg (4 mg Intravenous Given  06/21/18 2342)     Initial Impression / Assessment and Plan / ED Course  I have reviewed the triage vital signs and the nursing notes.  Pertinent labs & imaging results that were available during my care of the patient were reviewed by me and considered in my medical decision making (see chart for details).        Patient presents with left foot and ankle injury after a fall.  Ankle x-ray was unremarkable but left foot x-ray does show oblique fracture of the fifth metatarsal.  Patient placed in splint, crutches, analgesia.  She will follow-up with her orthopedic surgeon.  Final Clinical Impressions(s) / ED Diagnoses   Final diagnoses:  Closed displaced fracture of fifth metatarsal bone of left foot, initial encounter    ED Discharge Orders    None       Pollina, Gwenyth Allegra, MD 06/22/18 0007

## 2018-06-22 NOTE — ED Notes (Signed)
[facility] notified - patient ready to be transported

## 2018-08-12 ENCOUNTER — Other Ambulatory Visit: Payer: Self-pay

## 2018-08-12 ENCOUNTER — Emergency Department (HOSPITAL_COMMUNITY): Payer: Medicare Other

## 2018-08-12 ENCOUNTER — Observation Stay (HOSPITAL_COMMUNITY)
Admission: EM | Admit: 2018-08-12 | Discharge: 2018-08-14 | Disposition: A | Discharge status: Home / Self Care | Payer: Medicare Other | Attending: Internal Medicine | Admitting: Internal Medicine

## 2018-08-12 ENCOUNTER — Encounter (HOSPITAL_COMMUNITY): Payer: Self-pay | Admitting: Emergency Medicine

## 2018-08-12 DIAGNOSIS — Z96653 Presence of artificial knee joint, bilateral: Secondary | ICD-10-CM | POA: Diagnosis not present

## 2018-08-12 DIAGNOSIS — Z79890 Hormone replacement therapy (postmenopausal): Secondary | ICD-10-CM | POA: Diagnosis not present

## 2018-08-12 DIAGNOSIS — Z7951 Long term (current) use of inhaled steroids: Secondary | ICD-10-CM | POA: Diagnosis not present

## 2018-08-12 DIAGNOSIS — I700 Atherosclerosis of aorta: Secondary | ICD-10-CM | POA: Diagnosis not present

## 2018-08-12 DIAGNOSIS — R0602 Shortness of breath: Secondary | ICD-10-CM | POA: Diagnosis not present

## 2018-08-12 DIAGNOSIS — Z8551 Personal history of malignant neoplasm of bladder: Secondary | ICD-10-CM | POA: Diagnosis not present

## 2018-08-12 DIAGNOSIS — R0902 Hypoxemia: Secondary | ICD-10-CM | POA: Diagnosis not present

## 2018-08-12 DIAGNOSIS — R0600 Dyspnea, unspecified: Secondary | ICD-10-CM

## 2018-08-12 DIAGNOSIS — C801 Malignant (primary) neoplasm, unspecified: Secondary | ICD-10-CM | POA: Diagnosis present

## 2018-08-12 DIAGNOSIS — N183 Chronic kidney disease, stage 3 (moderate): Secondary | ICD-10-CM | POA: Insufficient documentation

## 2018-08-12 DIAGNOSIS — I129 Hypertensive chronic kidney disease with stage 1 through stage 4 chronic kidney disease, or unspecified chronic kidney disease: Secondary | ICD-10-CM | POA: Diagnosis not present

## 2018-08-12 DIAGNOSIS — Z85528 Personal history of other malignant neoplasm of kidney: Secondary | ICD-10-CM | POA: Diagnosis not present

## 2018-08-12 DIAGNOSIS — Z7982 Long term (current) use of aspirin: Secondary | ICD-10-CM | POA: Diagnosis not present

## 2018-08-12 DIAGNOSIS — Z79899 Other long term (current) drug therapy: Secondary | ICD-10-CM | POA: Diagnosis not present

## 2018-08-12 DIAGNOSIS — E039 Hypothyroidism, unspecified: Secondary | ICD-10-CM | POA: Insufficient documentation

## 2018-08-12 DIAGNOSIS — Z905 Acquired absence of kidney: Secondary | ICD-10-CM | POA: Insufficient documentation

## 2018-08-12 DIAGNOSIS — K219 Gastro-esophageal reflux disease without esophagitis: Secondary | ICD-10-CM | POA: Diagnosis present

## 2018-08-12 DIAGNOSIS — Z9049 Acquired absence of other specified parts of digestive tract: Secondary | ICD-10-CM | POA: Diagnosis not present

## 2018-08-12 DIAGNOSIS — R0789 Other chest pain: Secondary | ICD-10-CM | POA: Diagnosis not present

## 2018-08-12 DIAGNOSIS — Z1159 Encounter for screening for other viral diseases: Secondary | ICD-10-CM | POA: Insufficient documentation

## 2018-08-12 DIAGNOSIS — I10 Essential (primary) hypertension: Secondary | ICD-10-CM | POA: Diagnosis present

## 2018-08-12 DIAGNOSIS — R0609 Other forms of dyspnea: Secondary | ICD-10-CM

## 2018-08-12 HISTORY — DX: Gastro-esophageal reflux disease without esophagitis: K21.9

## 2018-08-12 HISTORY — DX: Essential (primary) hypertension: I10

## 2018-08-12 HISTORY — DX: Other specified diseases of pancreas: K86.89

## 2018-08-12 LAB — BASIC METABOLIC PANEL
Anion gap: 12 (ref 5–15)
BUN: 37 mg/dL — ABNORMAL HIGH (ref 8–23)
CO2: 22 mmol/L (ref 22–32)
Calcium: 9.7 mg/dL (ref 8.9–10.3)
Chloride: 103 mmol/L (ref 98–111)
Creatinine, Ser: 2.22 mg/dL — ABNORMAL HIGH (ref 0.44–1.00)
GFR calc Af Amer: 26 mL/min — ABNORMAL LOW (ref >60)
GFR calc non Af Amer: 22 mL/min — ABNORMAL LOW (ref >60)
Glucose, Bld: 87 mg/dL (ref 70–99)
Potassium: 4.4 mmol/L (ref 3.5–5.1)
Sodium: 137 mmol/L (ref 135–145)

## 2018-08-12 LAB — TROPONIN I: Troponin I: <0.03 ng/mL (ref <0.03)

## 2018-08-12 LAB — CBC
HCT: 44 % (ref 36.0–46.0)
Hemoglobin: 14.1 g/dL (ref 12.0–15.0)
MCH: 29.2 pg (ref 26.0–34.0)
MCHC: 32 g/dL (ref 30.0–36.0)
MCV: 91.1 fL (ref 80.0–100.0)
Platelets: 342 10*3/uL (ref 150–400)
RBC: 4.83 MIL/uL (ref 3.87–5.11)
RDW: 12.1 % (ref 11.5–15.5)
WBC: 9.4 10*3/uL (ref 4.0–10.5)
nRBC: 0 % (ref 0.0–0.2)

## 2018-08-12 LAB — D-DIMER, QUANTITATIVE: D-Dimer, Quant: 0.3 ug/mL-FEU (ref 0.00–0.50)

## 2018-08-12 MED ORDER — SODIUM CHLORIDE 0.9% FLUSH
3.0000 mL | Freq: Once | INTRAVENOUS | Status: AC
  Administered 2018-08-13: 3 mL via INTRAVENOUS

## 2018-08-12 MED ORDER — LACTATED RINGERS IV BOLUS
1000.0000 mL | Freq: Once | INTRAVENOUS | Status: AC
  Administered 2018-08-13: 1000 mL via INTRAVENOUS

## 2018-08-12 NOTE — ED Triage Notes (Signed)
Pt in POV, reports cough/sore throat, back pain with radiation into chest X 3 weeks. Pt has hx of Bladder Cancer. Sent here by PCP for further eval. VSS.

## 2018-08-12 NOTE — ED Provider Notes (Signed)
[place] EMERGENCY DEPARTMENT Provider Note   CSN: [id number] Arrival date & time: 08/12/18  1934    History   Chief Complaint Chief Complaint  Patient presents with   Shortness of Breath    HPI Channon Brougher is a 68 y.o. female.  HPI 68 year old female with a history of bladder cancer with metastasis to the kidneys presents with shortness of breath.  Patient had a left nephroureterectomy on 05/16/18.  She states that since that time she has had progressive shortness of breath is worse with exertion.  She has associated chest tightness, cough, sore throat, and chills. Denies fever.  Chest pain typically occurs with coughing.  No hemoptysis.  No nausea, vomiting, abdominal pain, or urinary symptoms.  Denies lower extremity swelling or erythema.  She states that symptoms are now limiting her ADLs.  Past Medical History:  Diagnosis Date   Cancer [facility]    bladder cancer   Renal disorder    Thyroid disease     There are no active problems to display for this patient.   Past Surgical History:  Procedure Laterality Date   ABDOMINAL HYSTERECTOMY     BREAST BIOPSY Left    benign   CHOLECYSTECTOMY     CYSTOSCOPY     KIDNEY SURGERY     NASAL SEPTUM SURGERY     REPLACEMENT TOTAL KNEE BILATERAL       OB History   No obstetric history on file.      Home Medications    Prior to Admission medications   Medication Sig Start Date End Date Taking? Authorizing Provider  aspirin EC 81 MG tablet Take by mouth.    [provider]  calcitRIOL (ROCALTROL) 0.5 MCG capsule Take by mouth.    [provider]  Cholecalciferol (VITAMIN D3) 50 MCG (2000 UT) capsule Take 1 tablet by mouth daily on Sunday, Tuesday, Thursday and Saturday    [provider]  cycloSPORINE (RESTASIS) 0.05 % ophthalmic emulsion Place 1 drop into both eyes 2 times daily.    [provider]  DULoxetine (CYMBALTA) 30 MG capsule Take by mouth.     [provider]  fluticasone (FLONASE) 50 MCG/ACT nasal spray Place into the nose.    [provider]  HYDROcodone-acetaminophen (NORCO/VICODIN) 5-325 MG tablet Take 1-2 tablets by mouth every 4 (four) hours as needed for moderate pain. 06/22/18   Orpah Greek, MD  levothyroxine (SYNTHROID, LEVOTHROID) 100 MCG tablet Take 175 mcg by mouth daily before breakfast.     [provider]  losartan (COZAAR) 100 MG tablet Take by mouth.    [provider]  meclizine (ANTIVERT) 25 MG tablet Take 1 tablet (25 mg total) by mouth 3 (three) times daily as needed for dizziness. 04/04/17   Fatima Blank, MD  metoprolol succinate (TOPROL-XL) 50 MG 24 hr tablet Take by mouth.    [provider]  metroNIDAZOLE (FLAGYL) 500 MG tablet Take 1 tablet (500 mg total) by mouth 2 (two) times daily with a meal. DO NOT CONSUME ALCOHOL WHILE TAKING THIS MEDICATION. 04/08/17   Maczis, Barth Kirks, PA-C  ondansetron (ZOFRAN) 4 MG tablet Take 1 tablet (4 mg total) by mouth every 8 (eight) hours as needed for nausea or vomiting. 04/08/17   Maczis, Barth Kirks, PA-C  rOPINIRole (REQUIP) 2 MG tablet Take by mouth.    [provider]  traMADol (ULTRAM) 50 MG tablet Take 1 tablet (50 mg total) by mouth every 6 (six) hours as  needed. 04/08/17   Maczis, Barth Kirks, PA-C  traZODone (DESYREL) 150 MG tablet Take by mouth.    [provider]    Family History No family history on file.  Social History Social History   Tobacco Use   Smoking status: Never Smoker   Smokeless tobacco: Never Used  Substance Use Topics   Alcohol use: No    Frequency: Never   Drug use: No     Allergies   Iodinated diagnostic agents; Sulfamethoxazole-trimethoprim; and Ciprofloxacin   Review of Systems Review of Systems  Constitutional: Positive for chills. Negative for fever.  HENT: Positive for sore throat. Negative for ear pain.   Eyes: Negative for pain and visual  disturbance.  Respiratory: Positive for cough and shortness of breath.   Cardiovascular: Positive for chest pain. Negative for palpitations.  Gastrointestinal: Negative for abdominal pain and vomiting.  Genitourinary: Negative for dysuria and hematuria.  Musculoskeletal: Negative for arthralgias and back pain.  Skin: Negative for color change and rash.  Neurological: Negative for seizures and syncope.  All other systems reviewed and are negative.    Physical Exam Updated Vital Signs BP 119/75 (BP Location: Right Arm)    Pulse 63    Temp 98.5 F (36.9 C) (Oral)    Resp 16    Ht 5\' 6"  (1.676 m)    Wt 86.2 kg    SpO2 95%    BMI 30.67 kg/m   Physical Exam Vitals signs and nursing note reviewed.  Constitutional:      General: She is not in acute distress.    Appearance: She is well-developed.  HENT:     Head: Normocephalic and atraumatic.  Eyes:     Conjunctiva/sclera: Conjunctivae normal.  Neck:     Musculoskeletal: Neck supple.  Cardiovascular:     Rate and Rhythm: Normal rate and regular rhythm.     Heart sounds: No murmur.  Pulmonary:     Effort: Pulmonary effort is normal. No respiratory distress.     Breath sounds: Normal breath sounds.  Abdominal:     Palpations: Abdomen is soft.     Tenderness: There is no abdominal tenderness.  Musculoskeletal: Normal range of motion.        General: No swelling or tenderness.     Comments: Walking boot on the left lower extremity  Skin:    General: Skin is warm and dry.  Neurological:     General: No focal deficit present.     Mental Status: She is alert and oriented to person, place, and time.      ED Treatments / Results  Labs (all labs ordered are listed, but only abnormal results are displayed) Labs Reviewed  BASIC METABOLIC PANEL - Abnormal; Notable for the following components:      Result Value   BUN 37 (*)    Creatinine, Ser 2.22 (*)    GFR calc non Af Amer 22 (*)    GFR calc Af Amer 26 (*)    All other  components within normal limits  CBC  TROPONIN I  TROPONIN I  BRAIN NATRIURETIC PEPTIDE  D-DIMER, QUANTITATIVE (NOT AT [facility])  URINALYSIS, ROUTINE W REFLEX MICROSCOPIC    EKG EKG Interpretation  Date/Time:  Sunday Aug 12 2018 19:43:25 EDT Ventricular Rate:  70 PR Interval:  176 QRS Duration: 72 QT Interval:  356 QTC Calculation: 384 R Axis:   46 Text Interpretation:  Normal sinus rhythm Cannot rule out Anteroseptal infarct , age undetermined Abnormal ECG No significant  change since last tracing Confirmed by Addison Lank [phone]) on 08/12/2018 11:21:25 PM   Radiology Dg Chest 2 View  Result Date: 08/12/2018 CLINICAL DATA:  History of renal carcinoma with shortness of breath EXAM: CHEST - 2 VIEW COMPARISON:  None. FINDINGS: Cardiac shadow is within normal limits. The lungs are clear bilaterally. Degenerative changes of the thoracic spine are noted. IMPRESSION: No active cardiopulmonary disease. Electronically Signed   By: Inez Catalina M.D.   On: 08/12/2018 20:34   Ct Chest Wo Contrast  Result Date: 08/12/2018 CLINICAL DATA:  Chronic cough and sore throat EXAM: CT CHEST WITHOUT CONTRAST TECHNIQUE: Multidetector CT imaging of the chest was performed following the standard protocol without IV contrast. COMPARISON:  Film from earlier in the same day. FINDINGS: Cardiovascular: Thoracic aorta demonstrates mild atherosclerotic change without aneurysmal dilatation. No cardiac enlargement is seen. Mediastinum/Nodes: Thoracic inlet is within normal limits. No hilar or mediastinal adenopathy is seen. The esophagus is within normal limits. Lungs/Pleura: Lungs are well aerated bilaterally. No focal infiltrate or sizable effusion is seen. No parenchymal nodules are noted. Upper Abdomen: Changes consistent with prior cholecystectomy are noted. The remainder of the upper abdomen is within normal limits. Musculoskeletal: Degenerative changes of the thoracic spine are noted. IMPRESSION: No acute abnormality  noted. Aortic Atherosclerosis (ICD10-I70.0). Electronically Signed   By: Inez Catalina M.D.   On: 08/12/2018 23:04    Procedures Procedures (including critical care time)  Medications Ordered in ED Medications  sodium chloride flush (NS) 0.9 % injection 3 mL (has no administration in time range)  lactated ringers bolus 1,000 mL (has no administration in time range)     Initial Impression / Assessment and Plan / ED Course  I have reviewed the triage vital signs and the nursing notes.  Pertinent labs & imaging results that were available during my care of the patient were reviewed by me and considered in my medical decision making (see chart for details).  68 year old female with a history of bladder cancer with metastasis to the kidneys presents with shortness of breath that is now limiting her ADLs.  Differential includes PE, pneumonia, metastasis, symptomatic pleural effusions.  Consider ACS less likely as chest pain is atypical.  EKG normal sinus rhythm.  T wave inversions in V1 and V2. T wave inversion more inverted in V2 from EKG on 04/03/2017 but otherwise similar.  Creatinine 2.22 with GFR of 22.  Unable to perform CT PE study safely especially in the setting of her having a single kidney.  CBC unremarkable.  Troponin undetectable.  Patient's most recent lab work was performed on 05/24/2018 at [facility]  Creatinine at that time was 2.29 up from 1.4 on 05/17/2018.  Chest x-ray shows no active cardiopulmonary disease.  Will obtain d-dimer, second troponin.  CT of the chest shows no acute abnormalities.  Patient signed out at 11:20PM.   Final Clinical Impressions(s) / ED Diagnoses   Final diagnoses:  None    ED Discharge Orders    None       Trinidad Curet, MD 08/12/18 3559    Sherwood Gambler, MD 08/15/18 1252

## 2018-08-13 ENCOUNTER — Observation Stay (HOSPITAL_BASED_OUTPATIENT_CLINIC_OR_DEPARTMENT_OTHER): Payer: Medicare Other

## 2018-08-13 ENCOUNTER — Encounter (HOSPITAL_COMMUNITY): Payer: Self-pay | Admitting: Internal Medicine

## 2018-08-13 DIAGNOSIS — C801 Malignant (primary) neoplasm, unspecified: Secondary | ICD-10-CM | POA: Diagnosis present

## 2018-08-13 DIAGNOSIS — R0789 Other chest pain: Secondary | ICD-10-CM | POA: Diagnosis not present

## 2018-08-13 DIAGNOSIS — N183 Chronic kidney disease, stage 3 (moderate): Secondary | ICD-10-CM

## 2018-08-13 DIAGNOSIS — I10 Essential (primary) hypertension: Secondary | ICD-10-CM | POA: Diagnosis present

## 2018-08-13 DIAGNOSIS — K219 Gastro-esophageal reflux disease without esophagitis: Secondary | ICD-10-CM | POA: Diagnosis present

## 2018-08-13 DIAGNOSIS — R0902 Hypoxemia: Secondary | ICD-10-CM | POA: Diagnosis present

## 2018-08-13 DIAGNOSIS — R079 Chest pain, unspecified: Secondary | ICD-10-CM

## 2018-08-13 LAB — URINALYSIS, ROUTINE W REFLEX MICROSCOPIC
Bilirubin Urine: NEGATIVE
Glucose, UA: NEGATIVE mg/dL
Ketones, ur: NEGATIVE mg/dL
Nitrite: NEGATIVE
Protein, ur: NEGATIVE mg/dL
Specific Gravity, Urine: 1.012 (ref 1.005–1.030)
pH: 5 (ref 5.0–8.0)

## 2018-08-13 LAB — BRAIN NATRIURETIC PEPTIDE: B Natriuretic Peptide: 71.1 pg/mL (ref 0.0–100.0)

## 2018-08-13 LAB — BASIC METABOLIC PANEL
Anion gap: 6 (ref 5–15)
BUN: 33 mg/dL — ABNORMAL HIGH (ref 8–23)
CO2: 30 mmol/L (ref 22–32)
Calcium: 9.3 mg/dL (ref 8.9–10.3)
Chloride: 104 mmol/L (ref 98–111)
Creatinine, Ser: 2.09 mg/dL — ABNORMAL HIGH (ref 0.44–1.00)
GFR calc Af Amer: 28 mL/min — ABNORMAL LOW (ref >60)
GFR calc non Af Amer: 24 mL/min — ABNORMAL LOW (ref >60)
Glucose, Bld: 105 mg/dL — ABNORMAL HIGH (ref 70–99)
Potassium: 4.6 mmol/L (ref 3.5–5.1)
Sodium: 140 mmol/L (ref 135–145)

## 2018-08-13 LAB — POCT I-STAT 7, (LYTES, BLD GAS, ICA,H+H)
Acid-Base Excess: 2 mmol/L (ref 0.0–2.0)
Bicarbonate: 27.8 mmol/L (ref 20.0–28.0)
Calcium, Ion: 1.33 mmol/L (ref 1.15–1.40)
HCT: 34 % — ABNORMAL LOW (ref 36.0–46.0)
Hemoglobin: 11.6 g/dL — ABNORMAL LOW (ref 12.0–15.0)
O2 Saturation: 95 %
Patient temperature: 97.8
Potassium: 4.3 mmol/L (ref 3.5–5.1)
Sodium: 139 mmol/L (ref 135–145)
TCO2: 29 mmol/L (ref 22–32)
pCO2 arterial: 44.7 mmHg (ref 32.0–48.0)
pH, Arterial: 7.399 (ref 7.350–7.450)
pO2, Arterial: 73 mmHg — ABNORMAL LOW (ref 83.0–108.0)

## 2018-08-13 LAB — CBC
HCT: 37.7 % (ref 36.0–46.0)
Hemoglobin: 12.3 g/dL (ref 12.0–15.0)
MCH: 29.6 pg (ref 26.0–34.0)
MCHC: 32.6 g/dL (ref 30.0–36.0)
MCV: 90.8 fL (ref 80.0–100.0)
Platelets: 266 10*3/uL (ref 150–400)
RBC: 4.15 MIL/uL (ref 3.87–5.11)
RDW: 12.2 % (ref 11.5–15.5)
WBC: 9.8 10*3/uL (ref 4.0–10.5)
nRBC: 0 % (ref 0.0–0.2)

## 2018-08-13 LAB — LIPID PANEL
Cholesterol: 142 mg/dL (ref 0–200)
HDL: 53 mg/dL (ref >40)
LDL Cholesterol: 79 mg/dL (ref 0–99)
Total CHOL/HDL Ratio: 2.7 RATIO
Triglycerides: 51 mg/dL (ref <150)
VLDL: 10 mg/dL (ref 0–40)

## 2018-08-13 LAB — ECHOCARDIOGRAM COMPLETE
Height: 66 in
Weight: 3129.6 oz

## 2018-08-13 LAB — MAGNESIUM: Magnesium: 1.8 mg/dL (ref 1.7–2.4)

## 2018-08-13 LAB — PHOSPHORUS: Phosphorus: 3.4 mg/dL (ref 2.5–4.6)

## 2018-08-13 LAB — TROPONIN I: Troponin I: <0.03 ng/mL (ref <0.03)

## 2018-08-13 LAB — SARS CORONAVIRUS 2 BY RT PCR (HOSPITAL ORDER, PERFORMED IN ~~LOC~~ HOSPITAL LAB): SARS Coronavirus 2: NEGATIVE

## 2018-08-13 LAB — TSH: TSH: 0.717 u[IU]/mL (ref 0.350–4.500)

## 2018-08-13 MED ORDER — ASPIRIN 325 MG PO TABS
325.0000 mg | ORAL_TABLET | Freq: Every day | ORAL | Status: DC
  Administered 2018-08-14: 325 mg via ORAL
  Filled 2018-08-13: qty 1

## 2018-08-13 MED ORDER — MAGNESIUM SULFATE 2 GM/50ML IV SOLN
2.0000 g | Freq: Once | INTRAVENOUS | Status: AC
  Administered 2018-08-13: 2 g via INTRAVENOUS
  Filled 2018-08-13: qty 50

## 2018-08-13 MED ORDER — ACETAMINOPHEN 325 MG PO TABS
650.0000 mg | ORAL_TABLET | Freq: Four times a day (QID) | ORAL | Status: DC | PRN
  Administered 2018-08-13 (×2): 650 mg via ORAL
  Filled 2018-08-13 (×2): qty 2

## 2018-08-13 MED ORDER — FLUTICASONE PROPIONATE 50 MCG/ACT NA SUSP
1.0000 | Freq: Every day | NASAL | Status: DC
  Administered 2018-08-13 – 2018-08-14 (×2): 1 via NASAL
  Filled 2018-08-13: qty 16

## 2018-08-13 MED ORDER — TRAZODONE HCL 150 MG PO TABS
150.0000 mg | ORAL_TABLET | Freq: Every day | ORAL | Status: DC
  Administered 2018-08-13: 21:00:00 150 mg via ORAL
  Filled 2018-08-13: qty 1

## 2018-08-13 MED ORDER — CALCITRIOL 0.5 MCG PO CAPS
0.5000 ug | ORAL_CAPSULE | ORAL | Status: DC
  Administered 2018-08-13: 0.5 ug via ORAL
  Filled 2018-08-13: qty 1

## 2018-08-13 MED ORDER — ROPINIROLE HCL 1 MG PO TABS
4.0000 mg | ORAL_TABLET | Freq: Every day | ORAL | Status: DC
  Administered 2018-08-13: 4 mg via ORAL
  Filled 2018-08-13 (×2): qty 4

## 2018-08-13 MED ORDER — LEVOTHYROXINE SODIUM 75 MCG PO TABS
175.0000 ug | ORAL_TABLET | Freq: Every day | ORAL | Status: DC
  Administered 2018-08-13 – 2018-08-14 (×2): 175 ug via ORAL
  Filled 2018-08-13 (×2): qty 1

## 2018-08-13 MED ORDER — CYCLOSPORINE 0.05 % OP EMUL
1.0000 [drp] | Freq: Two times a day (BID) | OPHTHALMIC | Status: DC
  Administered 2018-08-13 – 2018-08-14 (×3): 1 [drp] via OPHTHALMIC
  Filled 2018-08-13 (×4): qty 30

## 2018-08-13 MED ORDER — HEPARIN SODIUM (PORCINE) 5000 UNIT/ML IJ SOLN
5000.0000 [IU] | Freq: Three times a day (TID) | INTRAMUSCULAR | Status: DC
  Administered 2018-08-13 – 2018-08-14 (×3): 5000 [IU] via SUBCUTANEOUS
  Filled 2018-08-13 (×3): qty 1

## 2018-08-13 MED ORDER — VITAMIN D 25 MCG (1000 UNIT) PO TABS
2000.0000 [IU] | ORAL_TABLET | ORAL | Status: DC
  Administered 2018-08-14: 2000 [IU] via ORAL
  Filled 2018-08-13 (×2): qty 2

## 2018-08-13 MED ORDER — DULOXETINE HCL 60 MG PO CPEP
90.0000 mg | ORAL_CAPSULE | Freq: Every day | ORAL | Status: DC
  Administered 2018-08-13 – 2018-08-14 (×2): 90 mg via ORAL
  Filled 2018-08-13 (×2): qty 1

## 2018-08-13 NOTE — ED Notes (Signed)
Pt ambulated into hallway with her cane. She reports feeling more SOB while ambulating. Her initial saturation was 96%, dropped to 89% while ambulated, returned to 94% after ambulation

## 2018-08-13 NOTE — ED Notes (Signed)
ED TO INPATIENT HANDOFF REPORT  ED Nurse Name and Phone #: Rueben Kassim [phone]  S Name/Age/Gender Shelia Cruz 68 y.o. female Room/Bed: 020C/020C  Code Status   Code Status: Not on file  Home/SNF/Other Home Patient oriented to: self, place, time and situation Is this baseline? Yes   Triage Complete: Triage complete  Chief Complaint  chills, runny nose, sore throat, headache (cancer)  Triage Note Pt in POV, reports cough/sore throat, back pain with radiation into chest X 3 weeks. Pt has hx of Bladder Cancer. Sent here by PCP for further eval. VSS.    Allergies Allergies  Allergen Reactions  . Iodinated Diagnostic Agents Other (See Comments)    Was told to avoid after being diagnosed with stage 3 kidney disease Was told to avoid after being diagnosed with stage 3 kidney disease   . Sulfamethoxazole-Trimethoprim Swelling    Facial swelling and rash  . Ciprofloxacin Nausea And Vomiting    Level of Care/Admitting Diagnosis ED Disposition    ED Disposition Condition [facility] Area: [place] [[id number]]  Level of Care: Telemetry Medical [104]  I expect the patient will be discharged within 24 hours: No (not a candidate for 5C-Observation unit)  Covid Evaluation: Screening Protocol (No Symptoms)  Diagnosis: Chest tightness [384665]  Admitting Physician: Bonnell Public [[id number]]  Attending Physician: Dana Allan I [[id number]]  PT Class (Do Not Modify): Observation [104]  PT Acc Code (Do Not Modify): Observation [10022]       B Medical/Surgery History Past Medical History:  Diagnosis Date  . Cancer [facility])    bladder cancer  . Renal disorder   . Thyroid disease    Past Surgical History:  Procedure Laterality Date  . ABDOMINAL HYSTERECTOMY    . BREAST BIOPSY Left    benign  . CHOLECYSTECTOMY    . CYSTOSCOPY    . KIDNEY SURGERY    . NASAL SEPTUM SURGERY    . REPLACEMENT TOTAL KNEE BILATERAL       A IV  Location/Drains/Wounds Patient Lines/Drains/Airways Status   Active Line/Drains/Airways    Name:   Placement date:   Placement time:   Site:   Days:   Peripheral IV 08/12/18 Right Wrist   08/12/18    2353    Wrist   1          Intake/Output Last 24 hours No intake or output data in the 24 hours ending 08/13/18 0125  Labs/Imaging Results for orders placed or performed during the hospital encounter of 08/12/18 (from the past 48 hour(s))  Basic metabolic panel     Status: Abnormal   Collection Time: 08/12/18  7:54 PM  Result Value Ref Range   Sodium 137 135 - 145 mmol/L   Potassium 4.4 3.5 - 5.1 mmol/L   Chloride 103 98 - 111 mmol/L   CO2 22 22 - 32 mmol/L   Glucose, Bld 87 70 - 99 mg/dL   BUN 37 (H) 8 - 23 mg/dL   Creatinine, Ser 2.22 (H) 0.44 - 1.00 mg/dL   Calcium 9.7 8.9 - 10.3 mg/dL   GFR calc non Af Amer 22 (L) >60 mL/min   GFR calc Af Amer 26 (L) >60 mL/min   Anion gap 12 5 - 15    Comment: Performed at [facility], [address] [place], [place] [address]  CBC     Status: None   Collection Time: 08/12/18  7:54 PM  Result Value Ref Range  WBC 9.4 4.0 - 10.5 K/uL   RBC 4.83 3.87 - 5.11 MIL/uL   Hemoglobin 14.1 12.0 - 15.0 g/dL   HCT 44.0 36.0 - 46.0 %   MCV 91.1 80.0 - 100.0 fL   MCH 29.2 26.0 - 34.0 pg   MCHC 32.0 30.0 - 36.0 g/dL   RDW 12.1 11.5 - 15.5 %   Platelets 342 150 - 400 K/uL   nRBC 0.0 0.0 - 0.2 %    Comment: Performed at [facility], [place] [address] [place], [place] [address]  Troponin I - ONCE - STAT     Status: None   Collection Time: 08/12/18  7:54 PM  Result Value Ref Range   Troponin I <0.03 <0.03 ng/mL    Comment: Performed at [facility], [place] [address], [place], [place] [address]  Troponin I - ONCE - STAT     Status: None   Collection Time: 08/12/18 11:06 PM  Result Value Ref Range   Troponin I <0.03 <0.03 ng/mL    Comment: Performed at [facility], [place] [address] [place], [place] [address]  Brain  natriuretic peptide     Status: None   Collection Time: 08/12/18 11:06 PM  Result Value Ref Range   B Natriuretic Peptide 71.1 0.0 - 100.0 pg/mL    Comment: Performed at [place] [address], [place], [place] [address]  D-dimer, quantitative (not at [facility])     Status: None   Collection Time: 08/12/18 11:06 PM  Result Value Ref Range   D-Dimer, Quant 0.30 0.00 - 0.50 ug/mL-FEU    Comment: (NOTE) At the manufacturer cut-off of 0.50 ug/mL FEU, this assay has been documented to exclude PE with a sensitivity and negative predictive value of 97 to 99%.  At this time, this assay has not been approved by the FDA to exclude DVT/VTE. Results should be correlated with clinical presentation. Performed at [facility], [place] [address], [place],  [address]    Dg Chest 2 View  Result Date: 08/12/2018 CLINICAL DATA:  History of renal carcinoma with shortness of breath EXAM: CHEST - 2 VIEW COMPARISON:  None. FINDINGS: Cardiac shadow is within normal limits. The lungs are clear bilaterally. Degenerative changes of the thoracic spine are noted. IMPRESSION: No active cardiopulmonary disease. Electronically Signed   By: Inez Catalina M.D.   On: 08/12/2018 20:34   Ct Chest Wo Contrast  Result Date: 08/12/2018 CLINICAL DATA:  Chronic cough and sore throat EXAM: CT CHEST WITHOUT CONTRAST TECHNIQUE: Multidetector CT imaging of the chest was performed following the standard protocol without IV contrast. COMPARISON:  Film from earlier in the same day. FINDINGS: Cardiovascular: Thoracic aorta demonstrates mild atherosclerotic change without aneurysmal dilatation. No cardiac enlargement is seen. Mediastinum/Nodes: Thoracic inlet is within normal limits. No hilar or mediastinal adenopathy is seen. The esophagus is within normal limits. Lungs/Pleura: Lungs are well aerated bilaterally. No focal infiltrate or sizable effusion is seen. No parenchymal nodules are noted. Upper Abdomen: Changes  consistent with prior cholecystectomy are noted. The remainder of the upper abdomen is within normal limits. Musculoskeletal: Degenerative changes of the thoracic spine are noted. IMPRESSION: No acute abnormality noted. Aortic Atherosclerosis (ICD10-I70.0). Electronically Signed   By: Inez Catalina M.D.   On: 08/12/2018 23:04    Pending Labs Unresulted Labs (From admission, onward)    Start     Ordered   08/13/18 0031  SARS Coronavirus 2 (CEPHEID - Performed in [facility] hospital lab), Hosp Order  (Asymptomatic Patients Labs)  Once,   R    Question:  Rule Out  Answer:  Yes   08/13/18 0031   08/12/18 2233  Urinalysis, Routine w reflex microscopic  Once,   R     08/12/18 2232   Signed and Held  HIV antibody (Routine Testing)  Once,   R     Signed and Held   Signed and Held  CBC  (heparin)  Once,   R    Comments:  Baseline for heparin therapy IF NOT ALREADY DRAWN.  Notify MD if PLT < 100 K.    Signed and Held   Signed and Held  Creatinine, serum  (heparin)  Once,   R    Comments:  Baseline for heparin therapy IF NOT ALREADY DRAWN.    Signed and Held   Signed and Held  Magnesium  Once,   R     Signed and Held   Signed and Held  Phosphorus  Once,   R     Signed and Held   Signed and Held  TSH  Once,   R     Signed and Held   Signed and Occupational hygienist morning,   R     Signed and Held   Visual merchandiser and Held  CBC  Tomorrow morning,   R     Signed and Held   Signed and Held  Lipid panel  Once,   R     Signed and Held   Signed and Held  SARS Coronavirus 2 (CEPHEID - Performed in [facility] hospital lab), [employer]  (Asymptomatic Patients Labs)  Once,   R    Question:  Rule Out  Answer:  Yes   Signed and Held          Vitals/Pain Today's Vitals   08/12/18 2353 08/12/18 2354 08/13/18 0015 08/13/18 0045  BP: 110/60  102/64 (!) 119/59  Pulse: 62  (!) 58 (!) 55  Resp: (!) 21  11 (!) 9  Temp:      TempSrc:      SpO2: 96%  96% 96%  Weight:      Height:       PainSc:  8       Isolation Precautions No active isolations  Medications Medications  sodium chloride flush (NS) 0.9 % injection 3 mL (has no administration in time range)  lactated ringers bolus 1,000 mL (1,000 mLs Intravenous New Bag/Given 08/13/18 0003)    Mobility walks with device Low fall risk   Focused Assessments Pulmonary Assessment Handoff:  Lung sounds:   O2 Device: Room Air        R Recommendations: See Admitting Provider Note  Report given to:   Additional Notes:  Pt ambulated in hallway with increased sob, saturations dropped to 89% on room air, will be observed.

## 2018-08-13 NOTE — Progress Notes (Signed)
  Echocardiogram 2D Echocardiogram has been performed.  Shelia Cruz 08/13/2018, 9:20 AM

## 2018-08-13 NOTE — Progress Notes (Signed)
Patient admitted after midnight. See H&P. In brief admitted with dyspnea, hypoxia and chest tightness. Hx bladder cancer htn gerd. Oxygen saturation level dropped with ambulation in ED. ddimer negative. abg with o2 73, ph 7.3 co2 44.  Chest xray no active pulmonary disease. trops flat. ekg without acute changes. Echo results pending. Of note had stress test 04/2017 that was low risk.   Chest tightness/dyspnea/hypoxia - continue Aspirin - ambulated in hall today and sats stayed above 90%. -follow echo result -monitor on tele  Chronic kidney disease stage III: creatinine 2.09 this am. Trending down.  Continue to monitor   Hypertension: fair control. Home meds include no anti-hypertensive meds.  Continue to monitor.    History of bladder and renal cancer: Status post left nephrectomy in February of this year Follow-up with oncology. Of note has PET scan next week   Santiago Glad m. Black, NP

## 2018-08-13 NOTE — ED Notes (Signed)
ED Provider at bedside. 

## 2018-08-13 NOTE — Progress Notes (Signed)
Ambulate about 29ft. Patient felt dizzy. O2sats 94% RA.

## 2018-08-13 NOTE — ED Notes (Signed)
Patient aware that we need urine sample for testing, unable at this time. Pt given instruction on providing urine sample when able to do so.   

## 2018-08-13 NOTE — H&P (Signed)
History and Physical  Shelia Cruz IDP:[id number] DOB: 1950/07/14 DOA: 08/12/2018  Referring physician: ER provider PCP: Kathyrn Lass, MD  Outpatient Specialists: Oncology, urology and nephrology team Patient coming from: Home  Chief Complaint: Chest tightness worse with activity  HPI: Patient is a 68 year old Caucasian female with past medical history significant for bladder cancer, kidney cancer, chronic kidney disease stage III, hypertension and hypothyroidism for which patient takes replacement therapy.  Patient has also undergone repeated procedures for the bladder cancer, and underwent left nephrectomy in February of this year.  Patient has fracture of the left foot/ankle that occurred around early April of this year.  Patient is currently on a cast.  Patient presents with chest tightness that is worse with activity or talking.  Patient denied any definitive chest pain, nausea vomiting, diaphoresis and is very difficult to ascertain if there is associated shortness of breath.  O2 sat at rest is 99%, but drops to upper 80s with activity.  Patient has never smoked cigarettes, but informed me that her father smoked significantly.  Patient is divorced, and ex-husband did not smoke cigarettes.  Patient did not have any occupational exposures to lung irritants.  On presentation to the hospital, the d-dimer was within normal range, CT scan of the chest did not reveal any acute abnormality.  EKG revealed poor progression of the R wave from V1 to V3, but no old or prior EKG to compare with.  Troponins have come back negative on 2 occasions.  Cardiac BNP is within normal range.  ER provider symptoms significantly concerned the patient O2 sat dropped to 89% with ambulation, based on above, will admit patient for further assessment and management.  ED Course: Patient has been worked up extensively as documented above with no significant findings.  O2 sat is said to drop to 89% with ambulation.  Worsening  chest tightness is reported by the patient with activity.  Patient will be admitted overnight for possible further cardiac work-up.  Pertinent labs: See above.  EKG: Independently reviewed.   Imaging: independently reviewed.   Review of Systems:  Negative for fever, visual changes, sore throat, rash, new muscle aches, chest pain, SOB, dysuria, bleeding, n/v/abdominal pain.  Past Medical History:  Diagnosis Date  . Cancer [facility])    bladder cancer  . Renal disorder   . Thyroid disease     Past Surgical History:  Procedure Laterality Date  . ABDOMINAL HYSTERECTOMY    . BREAST BIOPSY Left    benign  . CHOLECYSTECTOMY    . CYSTOSCOPY    . KIDNEY SURGERY    . NASAL SEPTUM SURGERY    . REPLACEMENT TOTAL KNEE BILATERAL       reports that she has never smoked. She has never used smokeless tobacco. She reports that she does not drink alcohol or use drugs.  Allergies  Allergen Reactions  . Iodinated Diagnostic Agents Other (See Comments)    Was told to avoid after being diagnosed with stage 3 kidney disease Was told to avoid after being diagnosed with stage 3 kidney disease   . Sulfamethoxazole-Trimethoprim Swelling    Facial swelling and rash  . Ciprofloxacin Nausea And Vomiting    No family history on file.   Prior to Admission medications   Medication Sig Start Date End Date Taking? Authorizing Provider  aspirin EC 81 MG tablet Take by mouth.    [provider]  calcitRIOL (ROCALTROL) 0.5 MCG capsule Take by mouth.    [provider]  Cholecalciferol (VITAMIN  D3) 50 MCG (2000 UT) capsule Take 1 tablet by mouth daily on Sunday, Tuesday, Thursday and Saturday    [provider]  cycloSPORINE (RESTASIS) 0.05 % ophthalmic emulsion Place 1 drop into both eyes 2 times daily.    [provider]  DULoxetine (CYMBALTA) 30 MG capsule Take by mouth.    [provider]  fluticasone (FLONASE) 50 MCG/ACT nasal spray Place into the nose.     [provider]  HYDROcodone-acetaminophen (NORCO/VICODIN) 5-325 MG tablet Take 1-2 tablets by mouth every 4 (four) hours as needed for moderate pain. 06/22/18   Orpah Greek, MD  levothyroxine (SYNTHROID, LEVOTHROID) 100 MCG tablet Take 175 mcg by mouth daily before breakfast.     [provider]  losartan (COZAAR) 100 MG tablet Take by mouth.    [provider]  meclizine (ANTIVERT) 25 MG tablet Take 1 tablet (25 mg total) by mouth 3 (three) times daily as needed for dizziness. 04/04/17   Fatima Blank, MD  metoprolol succinate (TOPROL-XL) 50 MG 24 hr tablet Take by mouth.    [provider]  metroNIDAZOLE (FLAGYL) 500 MG tablet Take 1 tablet (500 mg total) by mouth 2 (two) times daily with a meal. DO NOT CONSUME ALCOHOL WHILE TAKING THIS MEDICATION. 04/08/17   Maczis, Barth Kirks, PA-C  ondansetron (ZOFRAN) 4 MG tablet Take 1 tablet (4 mg total) by mouth every 8 (eight) hours as needed for nausea or vomiting. 04/08/17   Maczis, Barth Kirks, PA-C  rOPINIRole (REQUIP) 2 MG tablet Take by mouth.    [provider]  traMADol (ULTRAM) 50 MG tablet Take 1 tablet (50 mg total) by mouth every 6 (six) hours as needed. 04/08/17   Maczis, Barth Kirks, PA-C  traZODone (DESYREL) 150 MG tablet Take by mouth.    [provider]    Physical Exam: Vitals:   08/12/18 2126 08/12/18 2353 08/13/18 0015 08/13/18 0045  BP: 119/75 110/60 102/64 (!) 119/59  Pulse: 63 62 (!) 58 (!) 55  Resp: 16 (!) 21 11 (!) 9  Temp:      TempSrc:      SpO2: 95% 96% 96% 96%  Weight:      Height:         Constitutional:  . Appears calm and comfortable. Eyes:  . No pallor. No jaundice.  ENMT:  . external ears, nose appear normal Neck:  . Neck is supple. No JVD Respiratory:  . CTA bilaterally, no w/r/r.  . Respiratory effort normal. No retractions or accessory muscle use Cardiovascular:  . S1S2 . No LE extremity edema   Abdomen:  . Abdomen is soft and non  tender. Organs are difficult to assess. Neurologic:  . Awake and alert. . Moves all limbs.  Wt Readings from Last 3 Encounters:  08/12/18 86.2 kg  06/21/18 86.6 kg  04/03/17 97.5 kg    I have personally reviewed following labs and imaging studies  Labs on Admission:  CBC: Recent Labs  Lab 08/12/18 1954  WBC 9.4  HGB 14.1  HCT 44.0  MCV 91.1  PLT 676   Basic Metabolic Panel: Recent Labs  Lab 08/12/18 1954  NA 137  K 4.4  CL 103  CO2 22  GLUCOSE 87  BUN 37*  CREATININE 2.22*  CALCIUM 9.7   Liver Function Tests: No results for input(s): AST, ALT, ALKPHOS, BILITOT, PROT, ALBUMIN in the last 168 hours. No results for input(s): LIPASE, AMYLASE in the last 168 hours. No results for input(s):  AMMONIA in the last 168 hours. Coagulation Profile: No results for input(s): INR, PROTIME in the last 168 hours. Cardiac Enzymes: Recent Labs  Lab 08/12/18 1954 08/12/18 2306  TROPONINI <0.03 <0.03   BNP (last 3 results) No results for input(s): PROBNP in the last 8760 hours. HbA1C: No results for input(s): HGBA1C in the last 72 hours. CBG: No results for input(s): GLUCAP in the last 168 hours. Lipid Profile: No results for input(s): CHOL, HDL, LDLCALC, TRIG, CHOLHDL, LDLDIRECT in the last 72 hours. Thyroid Function Tests: No results for input(s): TSH, T4TOTAL, FREET4, T3FREE, THYROIDAB in the last 72 hours. Anemia Panel: No results for input(s): VITAMINB12, FOLATE, FERRITIN, TIBC, IRON, RETICCTPCT in the last 72 hours. Urine analysis:    Component Value Date/Time   COLORURINE YELLOW 04/08/2017 [place] 04/08/2017 1205   LABSPEC 1.013 04/08/2017 1205   PHURINE 5.0 04/08/2017 1205   GLUCOSEU NEGATIVE 04/08/2017 1205   HGBUR NEGATIVE 04/08/2017 1205   [place] 04/08/2017 1205   [place] 04/08/2017 1205   PROTEINUR NEGATIVE 04/08/2017 1205   NITRITE NEGATIVE 04/08/2017 1205   LEUKOCYTESUR TRACE (A) 04/08/2017 1205   Sepsis  Labs: @LABRCNTIP (procalcitonin:4,lacticidven:4) )No results found for this or any previous visit (from the past 240 hour(s)).    Radiological Exams on Admission: Dg Chest 2 View  Result Date: 08/12/2018 CLINICAL DATA:  History of renal carcinoma with shortness of breath EXAM: CHEST - 2 VIEW COMPARISON:  None. FINDINGS: Cardiac shadow is within normal limits. The lungs are clear bilaterally. Degenerative changes of the thoracic spine are noted. IMPRESSION: No active cardiopulmonary disease. Electronically Signed   By: Inez Catalina M.D.   On: 08/12/2018 20:34   Ct Chest Wo Contrast  Result Date: 08/12/2018 CLINICAL DATA:  Chronic cough and sore throat EXAM: CT CHEST WITHOUT CONTRAST TECHNIQUE: Multidetector CT imaging of the chest was performed following the standard protocol without IV contrast. COMPARISON:  Film from earlier in the same day. FINDINGS: Cardiovascular: Thoracic aorta demonstrates mild atherosclerotic change without aneurysmal dilatation. No cardiac enlargement is seen. Mediastinum/Nodes: Thoracic inlet is within normal limits. No hilar or mediastinal adenopathy is seen. The esophagus is within normal limits. Lungs/Pleura: Lungs are well aerated bilaterally. No focal infiltrate or sizable effusion is seen. No parenchymal nodules are noted. Upper Abdomen: Changes consistent with prior cholecystectomy are noted. The remainder of the upper abdomen is within normal limits. Musculoskeletal: Degenerative changes of the thoracic spine are noted. IMPRESSION: No acute abnormality noted. Aortic Atherosclerosis (ICD10-I70.0). Electronically Signed   By: Inez Catalina M.D.   On: 08/12/2018 23:04    EKG: Independently reviewed.   Active Problems:   Chest tightness   Assessment/Plan Chest tightness, worse with activity: Observe patient Troponin has been negative on 2 occasions Hopefully, this is not angina equivalent. Echocardiogram Currently consult cardiology team in the morning Aspirin  Lipid profile Supplemental oxygen Other possibilities include malignancy related dyspnea -Though d-dimer is currently reported as negative, have a low threshold to repeat d-dimer I will consider VQ scan if ABG is suggestive of possible thromboembolic disease considering left ankle/foot injury.  Chronic kidney disease stage III: Cannot rule out possible mild acute kidney injury.   However, patient may be at baseline Continue to monitor Further management will depend on hospital course.  Hypertension:  This is currently controlled.   Continue to monitor.    History of bladder and renal cancer: Status post left nephrectomy in February of this year Follow-up with oncology on discharge if cardiac  work-up is nonrevealing   DVT prophylaxis: Subacute heparin Code Status: Full code Family Communication:  Disposition Plan: Likely discharge home Consults called: Consult cardiology in the morning Admission status: Observation  Time spent: 65 minutes  Dana Allan, MD  [facility] Pager #: [phone] 7PM-7AM contact night coverage as above  08/13/2018, 1:12 AM

## 2018-08-13 NOTE — ED Provider Notes (Addendum)
11:15 PM Handoff from Dr. Vincenza Hews at shift change.  Seen previously by Dr. Regenia Skeeter as well.  Patient currently awaiting d-dimer, troponin, BNP.  Patient with previous nephrectomy, chronic kidney disease but similar creatinine to recent value (2.29 on 3/5, but baseline of 1.2-1.3 prior to that, ? New baseline 2/2 nephrectomy).  Patient discussed with hospitalist by Dr. Vincenza Hews prior to handoff.  If lab work is reassuring, patient can likely go home with close PCP follow-up.  If d-dimer is elevated, patient will need to be admitted for VQ scan.  12:12 AM d-dimer, troponin, BNP looks good however patient was ambulated and her oxygen saturation dropped to 89% and was dyspneic.  12:31 AM reviewed lab work-up the patient to this point.  She endorses worsening shortness of breath with exertion and a tightness in her chest over the past several weeks.  This does not ever go completely away but is worse with exertion.  She reports becoming very short of breath even walking a short distance in the hallway.  Her current symptoms have made it hard for her to do anything at home.  Negative dobutamine stress ECHO 05/14/18.  BP 110/60 (BP Location: Right Arm)   Pulse 62   Temp 98.5 F (36.9 C) (Oral)   Resp (!) 21   Ht 5\' 6"  (1.676 m)   Wt 86.2 kg   SpO2 96%   BMI 30.67 kg/m    12:49 AM Spoke with Dr. Marthenia Rolling again regarding patient's symptoms. Appreciate input. Will check COVID and ABG.   1:29 AM Plan for obs admit. Pt updated.   BP (!) 122/58   Pulse 68   Temp 98.5 F (36.9 C) (Oral)   Resp 20   Ht 5\' 6"  (1.676 m)   Wt 86.2 kg   SpO2 95%   BMI 30.67 kg/m      Carlisle Cater, PA-C 08/13/18 0129    Sherwood Gambler, MD 08/15/18 1310

## 2018-08-14 ENCOUNTER — Observation Stay (HOSPITAL_BASED_OUTPATIENT_CLINIC_OR_DEPARTMENT_OTHER): Payer: Medicare Other

## 2018-08-14 DIAGNOSIS — I249 Acute ischemic heart disease, unspecified: Secondary | ICD-10-CM

## 2018-08-14 DIAGNOSIS — R0789 Other chest pain: Secondary | ICD-10-CM | POA: Diagnosis not present

## 2018-08-14 LAB — BASIC METABOLIC PANEL
Anion gap: 7 (ref 5–15)
BUN: 31 mg/dL — ABNORMAL HIGH (ref 8–23)
CO2: 29 mmol/L (ref 22–32)
Calcium: 9.2 mg/dL (ref 8.9–10.3)
Chloride: 105 mmol/L (ref 98–111)
Creatinine, Ser: 2.06 mg/dL — ABNORMAL HIGH (ref 0.44–1.00)
GFR calc Af Amer: 28 mL/min — ABNORMAL LOW (ref >60)
GFR calc non Af Amer: 24 mL/min — ABNORMAL LOW (ref >60)
Glucose, Bld: 100 mg/dL — ABNORMAL HIGH (ref 70–99)
Potassium: 4.1 mmol/L (ref 3.5–5.1)
Sodium: 141 mmol/L (ref 135–145)

## 2018-08-14 LAB — NM MYOCAR MULTI W/SPECT W/WALL MOTION / EF
Estimated workload: 1 METS
Exercise duration (min): 5 min
Exercise duration (sec): 51 s
MPHR: 153 {beats}/min
Peak HR: 95 {beats}/min
Percent HR: 62 %
Rest HR: 53 {beats}/min

## 2018-08-14 MED ORDER — TECHNETIUM TC 99M TETROFOSMIN IV KIT
10.0000 | PACK | Freq: Once | INTRAVENOUS | Status: AC | PRN
  Administered 2018-08-14: 10 via INTRAVENOUS

## 2018-08-14 MED ORDER — NITROGLYCERIN 0.4 MG SL SUBL
SUBLINGUAL_TABLET | SUBLINGUAL | Status: AC
  Filled 2018-08-14: qty 1

## 2018-08-14 MED ORDER — REGADENOSON 0.4 MG/5ML IV SOLN
0.4000 mg | Freq: Once | INTRAVENOUS | Status: AC
  Administered 2018-08-14: 0.4 mg via INTRAVENOUS
  Filled 2018-08-14: qty 5

## 2018-08-14 MED ORDER — REGADENOSON 0.4 MG/5ML IV SOLN
INTRAVENOUS | Status: AC
  Filled 2018-08-14: qty 5

## 2018-08-14 MED ORDER — NITROGLYCERIN 0.4 MG SL SUBL
0.4000 mg | SUBLINGUAL_TABLET | Freq: Once | SUBLINGUAL | Status: AC
  Administered 2018-08-14: 0.4 mg via SUBLINGUAL

## 2018-08-14 MED ORDER — TECHNETIUM TC 99M TETROFOSMIN IV KIT
30.0000 | PACK | Freq: Once | INTRAVENOUS | Status: AC | PRN
  Administered 2018-08-14: 12:00:00 30 via INTRAVENOUS

## 2018-08-14 NOTE — Progress Notes (Signed)
   Solon Palm presented for a nuclear stress test today.  No immediate complications.  Stress imaging is pending at this time.  Preliminary EKG findings may be listed in the chart, but the stress test result will not be finalized until perfusion imaging is complete.   Kathyrn Drown, NP-C 08/14/2018, 12:06 PM

## 2018-08-14 NOTE — Care Management Obs Status (Signed)
[facility] NOTIFICATION   Patient Details  Name: Shelia Cruz MRN: [id number] Date of Birth: October 24, 1950   Medicare Observation Status Notification Given:  Yes    Candie Chroman, LCSW 08/14/2018, 9:05 AM

## 2018-08-14 NOTE — Discharge Summary (Signed)
Physician Discharge Summary  Shelia Cruz JKK:[id number] DOB: Nov 13, 1950 DOA: 08/12/2018  PCP: Shelia Lass, MD  Admit date: 08/12/2018 Discharge date: 08/14/2018  Time spent: 45 minutes  Recommendations for Outpatient Follow-up:  1. Follow up with PCP in 1-2 weeks for evaluation of BP control as meds have changed.  2. Follow up with oncology as scheduled.    Discharge Diagnoses:  Principal Problem:   Chest tightness Active Problems:   Hypoxia   Cancer (HCC)   Hypomagnesemia   Hypertension   GERD (gastroesophageal reflux disease)   Discharge Condition: stable  Diet recommendation: heart healthy  Filed Weights   08/12/18 1945 08/13/18 0219 08/14/18 0426  Weight: 86.2 kg 88.7 kg 86.3 kg    History of present illness:  Patient admitted 5/25 with dyspnea, hypoxia and chest tightness. Hx bladder cancer htn gerd. Oxygen saturation level dropped with ambulation in ED. ddimer negative. abg with o2 73, ph 7.3 co2 44.  Chest xray no active pulmonary disease. trops flat. ekg without acute changes. Echo with moderate hypokinesis of the left ventricular basal-mid inferoseptal wall and inferior wall consistent with ischemia in distribution of right coronary. Cardiology recommended stress test. Of note had stress test 04/2017 that was low risk.  Hospital Course:  Chest tightness/dyspnea/hypoxia. No further discomfort. Troponin neg x2. ekg with NSR. Echo as noted above. Provided with aspirin. No events on tele. Discussed echo with cards who recommended stress test which revealed Normal pharmacologic nuclear stress test with no evidence for prior infarct or ischemia.   Chronic kidney disease stage III: creatinine 2.06 o day fo discharge. This is down from 2.22 on admission.   Hypertension: BP low end of normal. Home meds include cozaar and toprol-XL. These were held during hospitalization. Recommend OP follow up with PCP for evaluation of BP control and need for resumption of meds    History of bladder and renal cancer: Status post left nephrectomy in February of this year Follow-up with oncology. Of note has PET scan next week   Procedures:  NM stress test 5/26  Consultations:  cardiology  Discharge Exam: Vitals:   08/14/18 1159 08/14/18 1303  BP: 124/74 125/70  Pulse:  84  Resp:  18  Temp:  98.3 F (36.8 C)  SpO2:  100%    General: awake alert in no acute distress Cardiovascular: rrr no mgr no LE edema Respiratory: normal effort BS clear bilaterally no wheeze  Discharge Instructions   Discharge Instructions    Diet - low sodium heart healthy   Complete by:  As directed    Discharge instructions   Complete by:  As directed    Your stress test was normal We are holding your BP medications as your BP is lower end of normal-- monitor at home so medications can be resumed as needed   Increase activity slowly   Complete by:  As directed      Allergies as of 08/14/2018      Reactions   Iodinated Diagnostic Agents Other (See Comments)   Was told to avoid after being diagnosed with stage 3 kidney disease Was told to avoid after being diagnosed with stage 3 kidney disease   Sulfamethoxazole-trimethoprim Swelling   Facial swelling and rash   Ciprofloxacin Nausea And Vomiting      Medication List    STOP taking these medications   HYDROcodone-acetaminophen 5-325 MG tablet Commonly known as:  NORCO/VICODIN   losartan 100 MG tablet Commonly known as:  COZAAR   meclizine 25 MG tablet Commonly  known as:  ANTIVERT   metoprolol succinate 50 MG 24 hr tablet Commonly known as:  TOPROL-XL   metroNIDAZOLE 500 MG tablet Commonly known as:  Flagyl   ondansetron 4 MG tablet Commonly known as:  ZOFRAN   traMADol 50 MG tablet Commonly known as:  ULTRAM     TAKE these medications   aspirin EC 81 MG tablet Take 81 mg by mouth daily.   calcitRIOL 0.5 MCG capsule Commonly known as:  ROCALTROL Take 0.5 mcg by mouth 3 (three) times a week.  Monday, Wednesday and Friday   cycloSPORINE 0.05 % ophthalmic emulsion Commonly known as:  RESTASIS Place 1 drop into both eyes 2 times daily.   DULoxetine 30 MG capsule Commonly known as:  CYMBALTA Take 90 mg by mouth every morning.   fluticasone 50 MCG/ACT nasal spray Commonly known as:  FLONASE Place 1 spray into the nose daily.   levothyroxine 100 MCG tablet Commonly known as:  SYNTHROID Take 125 mcg by mouth daily before breakfast.   rOPINIRole 2 MG tablet Commonly known as:  REQUIP Take 4 mg by mouth at bedtime.   traZODone 150 MG tablet Commonly known as:  DESYREL Take 150 mg by mouth at bedtime.   Vitamin D3 50 MCG (2000 UT) capsule Take 2,000 Units by mouth 4 (four) times a week. Tuesday, Thursday, Saturday and Sunday      Allergies  Allergen Reactions  . Iodinated Diagnostic Agents Other (See Comments)    Was told to avoid after being diagnosed with stage 3 kidney disease Was told to avoid after being diagnosed with stage 3 kidney disease   . Sulfamethoxazole-Trimethoprim Swelling    Facial swelling and rash  . Ciprofloxacin Nausea And Vomiting   Follow-up Information    Shelia Lass, MD Follow up in 1 week(s).   Specialty:  Family Medicine Contact information: [address] [place] [address] [phone]            The results of significant diagnostics from this hospitalization (including imaging, microbiology, ancillary and laboratory) are listed below for reference.    Significant Diagnostic Studies: Dg Chest 2 View  Result Date: 08/12/2018 CLINICAL DATA:  History of renal carcinoma with shortness of breath EXAM: CHEST - 2 VIEW COMPARISON:  None. FINDINGS: Cardiac shadow is within normal limits. The lungs are clear bilaterally. Degenerative changes of the thoracic spine are noted. IMPRESSION: No active cardiopulmonary disease. Electronically Signed   By: Inez Catalina M.D.   On: 08/12/2018 20:34   Ct Chest Wo Contrast  Result  Date: 08/12/2018 CLINICAL DATA:  Chronic cough and sore throat EXAM: CT CHEST WITHOUT CONTRAST TECHNIQUE: Multidetector CT imaging of the chest was performed following the standard protocol without IV contrast. COMPARISON:  Film from earlier in the same day. FINDINGS: Cardiovascular: Thoracic aorta demonstrates mild atherosclerotic change without aneurysmal dilatation. No cardiac enlargement is seen. Mediastinum/Nodes: Thoracic inlet is within normal limits. No hilar or mediastinal adenopathy is seen. The esophagus is within normal limits. Lungs/Pleura: Lungs are well aerated bilaterally. No focal infiltrate or sizable effusion is seen. No parenchymal nodules are noted. Upper Abdomen: Changes consistent with prior cholecystectomy are noted. The remainder of the upper abdomen is within normal limits. Musculoskeletal: Degenerative changes of the thoracic spine are noted. IMPRESSION: No acute abnormality noted. Aortic Atherosclerosis (ICD10-I70.0). Electronically Signed   By: Inez Catalina M.D.   On: 08/12/2018 23:04   Nm Myocar Multi W/spect W/wall Motion / Ef  Result Date: 08/14/2018  There was  no ST segment deviation noted during stress.  T wave inversion was noted during stress in the V1 and V2 leads. Same as prior to stress  Blood pressure demonstrated a normal response to exercise.  The study is normal.  This is a low risk study.  The left ventricular ejection fraction is normal (55-65%).  Normal pharmacologic nuclear stress test with no evidence for prior infarct or ischemia.    Microbiology: Recent Results (from the past 240 hour(s))  SARS Coronavirus 2 (CEPHEID - Performed in [place] hospital lab), Hosp Order     Status: None   Collection Time: 08/13/18 12:36 AM  Result Value Ref Range Status   SARS Coronavirus 2 NEGATIVE NEGATIVE Final    Comment: (NOTE) If result is NEGATIVE SARS-CoV-2 target nucleic acids are NOT DETECTED. The SARS-CoV-2 RNA is generally detectable in upper and  lower  respiratory specimens during the acute phase of infection. The lowest  concentration of SARS-CoV-2 viral copies this assay can detect is 250  copies / mL. A negative result does not preclude SARS-CoV-2 infection  and should not be used as the sole basis for treatment or other  patient management decisions.  A negative result may occur with  improper specimen collection / handling, submission of specimen other  than nasopharyngeal swab, presence of viral mutation(s) within the  areas targeted by this assay, and inadequate number of viral copies  (<250 copies / mL). A negative result must be combined with clinical  observations, patient history, and epidemiological information. If result is POSITIVE SARS-CoV-2 target nucleic acids are DETECTED. The SARS-CoV-2 RNA is generally detectable in upper and lower  respiratory specimens dur ing the acute phase of infection.  Positive  results are indicative of active infection with SARS-CoV-2.  Clinical  correlation with patient history and other diagnostic information is  necessary to determine patient infection status.  Positive results do  not rule out bacterial infection or co-infection with other viruses. If result is PRESUMPTIVE POSTIVE SARS-CoV-2 nucleic acids MAY BE PRESENT.   A presumptive positive result was obtained on the submitted specimen  and confirmed on repeat testing.  While 2019 novel coronavirus  (SARS-CoV-2) nucleic acids may be present in the submitted sample  additional confirmatory testing may be necessary for epidemiological  and / or clinical management purposes  to differentiate between  SARS-CoV-2 and other Sarbecovirus currently known to infect humans.  If clinically indicated additional testing with an alternate test  methodology [phone]) is advised. The SARS-CoV-2 RNA is generally  detectable in upper and lower respiratory sp ecimens during the acute  phase of infection. The expected result is  Negative. Fact Sheet for Patients:  StrictlyIdeas.no Fact Sheet for Healthcare Providers: BankingDealers.co.za This test is not yet approved or cleared by the [place] FDA and has been authorized for detection and/or diagnosis of SARS-CoV-2 by FDA under an Emergency Use Authorization (EUA).  This EUA will remain in effect (meaning this test can be used) for the duration of the COVID-19 declaration under Section 564(b)(1) of the Act, 21 U.S.C. section 360bbb-3(b)(1), unless the authorization is terminated or revoked sooner. Performed at [facility], [place] [address], [place], [place] [address]      Labs: Basic Metabolic Panel: Recent Labs  Lab 08/12/18 1954 08/13/18 0131 08/13/18 0316 08/14/18 0538  NA 137 139 140 141  K 4.4 4.3 4.6 4.1  CL 103  --  104 105  CO2 22  --  30 29  GLUCOSE 87  --  105* 100*  BUN 37*  --  33* 31*  CREATININE 2.22*  --  2.09* 2.06*  CALCIUM 9.7  --  9.3 9.2  MG  --   --  1.8  --   PHOS  --   --  3.4  --    Liver Function Tests: No results for input(s): AST, ALT, ALKPHOS, BILITOT, PROT, ALBUMIN in the last 168 hours. No results for input(s): LIPASE, AMYLASE in the last 168 hours. No results for input(s): AMMONIA in the last 168 hours. CBC: Recent Labs  Lab 08/12/18 1954 08/13/18 0131 08/13/18 0316  WBC 9.4  --  9.8  HGB 14.1 11.6* 12.3  HCT 44.0 34.0* 37.7  MCV 91.1  --  90.8  PLT 342  --  266   Cardiac Enzymes: Recent Labs  Lab 08/12/18 1954 08/12/18 2306  TROPONINI <0.03 <0.03   BNP: BNP (last 3 results) Recent Labs    08/12/18 2306  BNP 71.1    ProBNP (last 3 results) No results for input(s): PROBNP in the last 8760 hours.  CBG: No results for input(s): GLUCAP in the last 168 hours.     SignedRadene Gunning NP  [facility] 08/14/2018, 1:38 PM

## 2018-08-14 NOTE — TOC Initial Note (Signed)
Transition of Care [facility]) - Initial/Assessment Note    Patient Details  Name: Shelia Cruz MRN: [id number] Date of Birth: [birth year]  [facility] CM/SW Contact:    Candie Chroman, LCSW Phone Number: 08/14/2018, 9:24 AM  Clinical Narrative: CSW met with patient. She lives home with her cat. She mentioned a daughter that is involved. She uses a cane at home. No home health services in place. Patient still drives. Her PCP is Kathyrn Lass at [facility] and her pharmacy is [facility] [address]. Patient stated she has a stress test at 10:30 today and hopes to be able to go home afterwards. No further concerns. CSW encouraged patient to contact CSW as needed. CSW will continue to follow patient for support and needs once discharged.            Expected Discharge Plan: Home/Self Care Barriers to Discharge: Continued Medical Work up   Patient Goals and CMS Choice Patient states their goals for this hospitalization and ongoing recovery are:: "I hope I get to go home today."      Expected Discharge Plan and Services Expected Discharge Plan: Home/Self Care   Discharge Planning Services: NA Post Acute Care Choice: NA Living arrangements for the past 2 months: Single Family Home Expected Discharge Date: 08/14/18               DME Arranged: N/A                    Prior Living Arrangements/Services Living arrangements for the past 2 months: Single Family Home Lives with:: Pets Patient language and need for interpreter reviewed:: No Do you feel safe going back to the place where you live?: Yes      Need for Family Participation in Patient Care: No (Comment) Care giver support system in place?: No (comment)(Home with cat. Daughter involved.) Current home services: DME Criminal Activity/Legal Involvement Pertinent to Current Situation/Hospitalization: No - Comment as needed  Activities of Daily Living Home Assistive Devices/Equipment: Cane (specify quad or  straight) ADL Screening (condition at time of admission) Patient's cognitive ability adequate to safely complete daily activities?: Yes Is the patient deaf or have difficulty hearing?: Yes Does the patient have difficulty seeing, even when wearing glasses/contacts?: No Does the patient have difficulty concentrating, remembering, or making decisions?: No Patient able to express need for assistance with ADLs?: Yes Does the patient have difficulty dressing or bathing?: No Independently performs ADLs?: Yes (appropriate for developmental age) Does the patient have difficulty walking or climbing stairs?: No Weakness of Legs: None Weakness of Arms/Hands: None  Permission Sought/Granted                  Emotional Assessment Appearance:: Appears stated age Attitude/Demeanor/Rapport: Engaged, Gracious Affect (typically observed): Accepting, Appropriate, Calm, Pleasant Orientation: : Oriented to Self, Oriented to Place, Oriented to  Time, Oriented to Situation Alcohol / Substance Use: Never Used Psych Involvement: No (comment)  Admission diagnosis:   chills, runny nose, sore throat, headache   cancer Patient Active Problem List   Diagnosis Date Noted  . Chest tightness 08/13/2018  . Hypoxia 08/13/2018  . Hypomagnesemia 08/13/2018  . Cancer ([place])   . Hypertension   . GERD (gastroesophageal reflux disease)    PCP:  Kathyrn Lass, MD Pharmacy:   [facility] [phone] - [place], [place] - [address] AT [place] [address] [place] [address] Phone: [phone] Fax: [phone]  [place], [place] [address] Phone: [phone] Fax: [phone]     Social Determinants of Health (SDOH) Interventions    Readmission Risk Interventions No flowsheet data found.

## 2018-08-14 NOTE — Progress Notes (Signed)
Pt has PCP, no new medications and hospital f/u. No needs per CM.

## 2018-08-14 NOTE — Progress Notes (Signed)
Patients chest pain has resolved. Patient denies any complaints at this time. Patient given snack and beverage. Patient taken back to [facility] to complete testing.

## 2018-08-14 NOTE — Progress Notes (Signed)
Patient complains of chest pain post lexiscan. Verbal orders obtained from Kathyrn Drown, NP.

## 2018-08-21 LAB — HIV ANTIBODY (ROUTINE TESTING W REFLEX): HIV Screen 4th Generation wRfx: NONREACTIVE

## 2019-09-30 ENCOUNTER — Inpatient Hospital Stay (HOSPITAL_COMMUNITY)
Admission: EM | Admit: 2019-09-30 | Discharge: 2019-10-04 | DRG: 683 | Disposition: A | Discharge status: Home Health | Payer: Medicare Other | Attending: Internal Medicine | Admitting: Internal Medicine

## 2019-09-30 ENCOUNTER — Other Ambulatory Visit: Payer: Self-pay

## 2019-09-30 ENCOUNTER — Inpatient Hospital Stay (HOSPITAL_COMMUNITY): Payer: Medicare Other

## 2019-09-30 ENCOUNTER — Emergency Department (HOSPITAL_COMMUNITY): Payer: Medicare Other

## 2019-09-30 ENCOUNTER — Encounter (HOSPITAL_COMMUNITY): Payer: Self-pay

## 2019-09-30 DIAGNOSIS — Z881 Allergy status to other antibiotic agents status: Secondary | ICD-10-CM

## 2019-09-30 DIAGNOSIS — N131 Hydronephrosis with ureteral stricture, not elsewhere classified: Secondary | ICD-10-CM | POA: Diagnosis present

## 2019-09-30 DIAGNOSIS — Z8551 Personal history of malignant neoplasm of bladder: Secondary | ICD-10-CM | POA: Diagnosis not present

## 2019-09-30 DIAGNOSIS — N2889 Other specified disorders of kidney and ureter: Secondary | ICD-10-CM

## 2019-09-30 DIAGNOSIS — I129 Hypertensive chronic kidney disease with stage 1 through stage 4 chronic kidney disease, or unspecified chronic kidney disease: Secondary | ICD-10-CM | POA: Diagnosis present

## 2019-09-30 DIAGNOSIS — Z91041 Radiographic dye allergy status: Secondary | ICD-10-CM

## 2019-09-30 DIAGNOSIS — N179 Acute kidney failure, unspecified: Principal | ICD-10-CM | POA: Diagnosis present

## 2019-09-30 DIAGNOSIS — Z882 Allergy status to sulfonamides status: Secondary | ICD-10-CM

## 2019-09-30 DIAGNOSIS — Z20822 Contact with and (suspected) exposure to covid-19: Secondary | ICD-10-CM | POA: Diagnosis present

## 2019-09-30 DIAGNOSIS — Z90710 Acquired absence of both cervix and uterus: Secondary | ICD-10-CM | POA: Diagnosis not present

## 2019-09-30 DIAGNOSIS — F329 Major depressive disorder, single episode, unspecified: Secondary | ICD-10-CM | POA: Diagnosis present

## 2019-09-30 DIAGNOSIS — Z905 Acquired absence of kidney: Secondary | ICD-10-CM

## 2019-09-30 DIAGNOSIS — N184 Chronic kidney disease, stage 4 (severe): Secondary | ICD-10-CM | POA: Diagnosis present

## 2019-09-30 DIAGNOSIS — Z7982 Long term (current) use of aspirin: Secondary | ICD-10-CM

## 2019-09-30 DIAGNOSIS — Z8249 Family history of ischemic heart disease and other diseases of the circulatory system: Secondary | ICD-10-CM | POA: Diagnosis not present

## 2019-09-30 DIAGNOSIS — G8929 Other chronic pain: Secondary | ICD-10-CM | POA: Diagnosis present

## 2019-09-30 DIAGNOSIS — Z9049 Acquired absence of other specified parts of digestive tract: Secondary | ICD-10-CM

## 2019-09-30 DIAGNOSIS — E875 Hyperkalemia: Secondary | ICD-10-CM | POA: Diagnosis present

## 2019-09-30 DIAGNOSIS — Z96653 Presence of artificial knee joint, bilateral: Secondary | ICD-10-CM | POA: Diagnosis present

## 2019-09-30 DIAGNOSIS — G4700 Insomnia, unspecified: Secondary | ICD-10-CM | POA: Diagnosis present

## 2019-09-30 DIAGNOSIS — I10 Essential (primary) hypertension: Secondary | ICD-10-CM | POA: Diagnosis not present

## 2019-09-30 DIAGNOSIS — G2581 Restless legs syndrome: Secondary | ICD-10-CM | POA: Diagnosis present

## 2019-09-30 DIAGNOSIS — Y846 Urinary catheterization as the cause of abnormal reaction of the patient, or of later complication, without mention of misadventure at the time of the procedure: Secondary | ICD-10-CM | POA: Diagnosis present

## 2019-09-30 DIAGNOSIS — T83022A Displacement of nephrostomy catheter, initial encounter: Secondary | ICD-10-CM | POA: Diagnosis present

## 2019-09-30 DIAGNOSIS — N135 Crossing vessel and stricture of ureter without hydronephrosis: Secondary | ICD-10-CM

## 2019-09-30 DIAGNOSIS — Z79890 Hormone replacement therapy (postmenopausal): Secondary | ICD-10-CM

## 2019-09-30 DIAGNOSIS — R042 Hemoptysis: Secondary | ICD-10-CM | POA: Diagnosis present

## 2019-09-30 DIAGNOSIS — Z79899 Other long term (current) drug therapy: Secondary | ICD-10-CM

## 2019-09-30 DIAGNOSIS — F419 Anxiety disorder, unspecified: Secondary | ICD-10-CM | POA: Diagnosis present

## 2019-09-30 DIAGNOSIS — E872 Acidosis, unspecified: Secondary | ICD-10-CM

## 2019-09-30 DIAGNOSIS — E039 Hypothyroidism, unspecified: Secondary | ICD-10-CM | POA: Diagnosis present

## 2019-09-30 DIAGNOSIS — R609 Edema, unspecified: Secondary | ICD-10-CM | POA: Diagnosis not present

## 2019-09-30 DIAGNOSIS — K921 Melena: Secondary | ICD-10-CM | POA: Diagnosis present

## 2019-09-30 DIAGNOSIS — D62 Acute posthemorrhagic anemia: Secondary | ICD-10-CM

## 2019-09-30 DIAGNOSIS — Z66 Do not resuscitate: Secondary | ICD-10-CM | POA: Diagnosis present

## 2019-09-30 DIAGNOSIS — K219 Gastro-esophageal reflux disease without esophagitis: Secondary | ICD-10-CM | POA: Diagnosis present

## 2019-09-30 DIAGNOSIS — Z906 Acquired absence of other parts of urinary tract: Secondary | ICD-10-CM

## 2019-09-30 HISTORY — DX: Crossing vessel and stricture of ureter without hydronephrosis: N13.5

## 2019-09-30 HISTORY — PX: IR EXT NEPHROURETERAL CATH EXCHANGE: IMG5418

## 2019-09-30 HISTORY — DX: Acute kidney failure, unspecified: N17.9

## 2019-09-30 LAB — URINALYSIS, ROUTINE W REFLEX MICROSCOPIC
Bilirubin Urine: NEGATIVE
Glucose, UA: NEGATIVE mg/dL
Ketones, ur: NEGATIVE mg/dL
Nitrite: NEGATIVE
Protein, ur: 100 mg/dL — AB
Specific Gravity, Urine: 1.006 (ref 1.005–1.030)
WBC, UA: >50 WBC/hpf — ABNORMAL HIGH (ref 0–5)
pH: 5 (ref 5.0–8.0)

## 2019-09-30 LAB — CBC
HCT: 27.7 % — ABNORMAL LOW (ref 36.0–46.0)
Hemoglobin: 8.4 g/dL — ABNORMAL LOW (ref 12.0–15.0)
MCH: 25.9 pg — ABNORMAL LOW (ref 26.0–34.0)
MCHC: 30.3 g/dL (ref 30.0–36.0)
MCV: 85.5 fL (ref 80.0–100.0)
Platelets: 580 10*3/uL — ABNORMAL HIGH (ref 150–400)
RBC: 3.24 MIL/uL — ABNORMAL LOW (ref 3.87–5.11)
RDW: 15.7 % — ABNORMAL HIGH (ref 11.5–15.5)
WBC: 11 10*3/uL — ABNORMAL HIGH (ref 4.0–10.5)
nRBC: 0 % (ref 0.0–0.2)

## 2019-09-30 LAB — I-STAT CHEM 8, ED
BUN: 122 mg/dL — ABNORMAL HIGH (ref 8–23)
Calcium, Ion: 1.27 mmol/L (ref 1.15–1.40)
Chloride: 104 mmol/L (ref 98–111)
Creatinine, Ser: 17.2 mg/dL — ABNORMAL HIGH (ref 0.44–1.00)
Glucose, Bld: 93 mg/dL (ref 70–99)
HCT: 26 % — ABNORMAL LOW (ref 36.0–46.0)
Hemoglobin: 8.8 g/dL — ABNORMAL LOW (ref 12.0–15.0)
Potassium: 7 mmol/L (ref 3.5–5.1)
Sodium: 134 mmol/L — ABNORMAL LOW (ref 135–145)
TCO2: 19 mmol/L — ABNORMAL LOW (ref 22–32)

## 2019-09-30 LAB — BASIC METABOLIC PANEL
Anion gap: 15 (ref 5–15)
BUN: 115 mg/dL — ABNORMAL HIGH (ref 8–23)
CO2: 16 mmol/L — ABNORMAL LOW (ref 22–32)
Calcium: 9.3 mg/dL (ref 8.9–10.3)
Chloride: 103 mmol/L (ref 98–111)
Creatinine, Ser: 15.02 mg/dL — ABNORMAL HIGH (ref 0.44–1.00)
GFR calc Af Amer: 3 mL/min — ABNORMAL LOW (ref >60)
GFR calc non Af Amer: 2 mL/min — ABNORMAL LOW (ref >60)
Glucose, Bld: 142 mg/dL — ABNORMAL HIGH (ref 70–99)
Potassium: 7.3 mmol/L (ref 3.5–5.1)
Sodium: 134 mmol/L — ABNORMAL LOW (ref 135–145)

## 2019-09-30 LAB — SARS CORONAVIRUS 2 BY RT PCR (HOSPITAL ORDER, PERFORMED IN ~~LOC~~ HOSPITAL LAB): SARS Coronavirus 2: NEGATIVE

## 2019-09-30 LAB — ABO/RH: ABO/RH(D): A NEG

## 2019-09-30 LAB — PROTIME-INR
INR: 1.2 (ref 0.8–1.2)
Prothrombin Time: 14.4 seconds (ref 11.4–15.2)

## 2019-09-30 LAB — TYPE AND SCREEN
ABO/RH(D): A NEG
Antibody Screen: NEGATIVE

## 2019-09-30 LAB — CBG MONITORING, ED: Glucose-Capillary: 82 mg/dL (ref 70–99)

## 2019-09-30 MED ORDER — SODIUM CHLORIDE 0.9 % IV BOLUS
1000.0000 mL | Freq: Once | INTRAVENOUS | Status: AC
  Administered 2019-09-30: 1000 mL via INTRAVENOUS

## 2019-09-30 MED ORDER — IOHEXOL 300 MG/ML  SOLN
50.0000 mL | Freq: Once | INTRAMUSCULAR | Status: AC | PRN
  Administered 2019-09-30: 15 mL

## 2019-09-30 MED ORDER — ONDANSETRON HCL 4 MG/2ML IJ SOLN
4.0000 mg | Freq: Four times a day (QID) | INTRAMUSCULAR | Status: DC | PRN
  Administered 2019-10-03 – 2019-10-04 (×2): 4 mg via INTRAVENOUS
  Filled 2019-09-30 (×2): qty 2

## 2019-09-30 MED ORDER — SODIUM ZIRCONIUM CYCLOSILICATE 5 G PO PACK
5.0000 g | PACK | Freq: Once | ORAL | Status: AC
  Administered 2019-09-30: 5 g via ORAL
  Filled 2019-09-30: qty 1

## 2019-09-30 MED ORDER — MIDAZOLAM HCL 2 MG/2ML IJ SOLN
INTRAMUSCULAR | Status: AC | PRN
  Administered 2019-09-30 (×2): 0.5 mg via INTRAVENOUS

## 2019-09-30 MED ORDER — CYCLOSPORINE 0.05 % OP EMUL
1.0000 [drp] | Freq: Two times a day (BID) | OPHTHALMIC | Status: DC
  Administered 2019-10-01 – 2019-10-04 (×8): 1 [drp] via OPHTHALMIC
  Filled 2019-09-30 (×10): qty 1

## 2019-09-30 MED ORDER — MIDAZOLAM HCL 2 MG/2ML IJ SOLN
INTRAMUSCULAR | Status: AC
  Filled 2019-09-30: qty 2

## 2019-09-30 MED ORDER — ALBUTEROL SULFATE (2.5 MG/3ML) 0.083% IN NEBU
5.0000 mg | INHALATION_SOLUTION | Freq: Once | RESPIRATORY_TRACT | Status: AC
  Administered 2019-09-30: 5 mg via RESPIRATORY_TRACT
  Filled 2019-09-30: qty 6

## 2019-09-30 MED ORDER — DEXTROSE 50 % IV SOLN
1.0000 | Freq: Once | INTRAVENOUS | Status: AC
  Administered 2019-09-30: 50 mL via INTRAVENOUS
  Filled 2019-09-30: qty 50

## 2019-09-30 MED ORDER — ROPINIROLE HCL 1 MG PO TABS
2.0000 mg | ORAL_TABLET | Freq: Every evening | ORAL | Status: DC | PRN
  Administered 2019-10-01 – 2019-10-03 (×3): 2 mg via ORAL
  Filled 2019-09-30 (×3): qty 2

## 2019-09-30 MED ORDER — LEVOTHYROXINE SODIUM 100 MCG PO TABS
100.0000 ug | ORAL_TABLET | Freq: Every day | ORAL | Status: DC
  Administered 2019-10-01 – 2019-10-04 (×4): 100 ug via ORAL
  Filled 2019-09-30 (×4): qty 1

## 2019-09-30 MED ORDER — CALCIUM GLUCONATE-NACL 2-0.675 GM/100ML-% IV SOLN
2.0000 g | Freq: Once | INTRAVENOUS | Status: AC
  Administered 2019-09-30: 2000 mg via INTRAVENOUS
  Filled 2019-09-30: qty 100

## 2019-09-30 MED ORDER — FENTANYL CITRATE (PF) 100 MCG/2ML IJ SOLN: 50.0000 ug | INTRAMUSCULAR | Status: DC | PRN

## 2019-09-30 MED ORDER — FENTANYL CITRATE (PF) 100 MCG/2ML IJ SOLN
INTRAMUSCULAR | Status: AC
  Filled 2019-09-30: qty 2

## 2019-09-30 MED ORDER — PANTOPRAZOLE SODIUM 40 MG IV SOLR
40.0000 mg | Freq: Two times a day (BID) | INTRAVENOUS | Status: DC
  Administered 2019-09-30 – 2019-10-04 (×8): 40 mg via INTRAVENOUS
  Filled 2019-09-30 (×8): qty 40

## 2019-09-30 MED ORDER — INSULIN ASPART 100 UNIT/ML IV SOLN
5.0000 [IU] | Freq: Once | INTRAVENOUS | Status: AC
  Administered 2019-09-30: 5 [IU] via INTRAVENOUS

## 2019-09-30 MED ORDER — FENTANYL CITRATE (PF) 100 MCG/2ML IJ SOLN
50.0000 ug | INTRAMUSCULAR | Status: DC | PRN
  Administered 2019-09-30 – 2019-10-03 (×11): 50 ug via INTRAVENOUS
  Filled 2019-09-30 (×11): qty 2

## 2019-09-30 MED ORDER — FENTANYL CITRATE (PF) 100 MCG/2ML IJ SOLN
INTRAMUSCULAR | Status: AC | PRN
  Administered 2019-09-30 (×2): 25 ug via INTRAVENOUS

## 2019-09-30 MED ORDER — SODIUM BICARBONATE 8.4 % IV SOLN
50.0000 meq | Freq: Once | INTRAVENOUS | Status: AC
  Administered 2019-09-30: 50 meq via INTRAVENOUS
  Filled 2019-09-30: qty 50

## 2019-09-30 MED ORDER — LIDOCAINE HCL 1 % IJ SOLN
INTRAMUSCULAR | Status: AC
  Filled 2019-09-30: qty 20

## 2019-09-30 MED ORDER — TRAZODONE HCL 50 MG PO TABS
150.0000 mg | ORAL_TABLET | Freq: Every evening | ORAL | Status: DC | PRN
  Administered 2019-10-01 – 2019-10-03 (×2): 150 mg via ORAL
  Filled 2019-09-30 (×3): qty 1

## 2019-09-30 NOTE — ED Provider Notes (Signed)
[place] EMERGENCY DEPARTMENT Provider Note   CSN: [id number] Arrival date & time: 09/30/19  1542     History Chief Complaint  Patient presents with  . Weakness  . Hemoptysis    Shelia Cruz is a 69 y.o. female.  HPI    Patient with history of metastatic bladder cancer now presents with weakness, hemoptysis, abdominal pain. Pain is largely right-sided, seemingly in the back, though diffuse. Pain is diffuse, sore, moderate. Illness began about 1 month ago, and symptoms have been progressive including abdominal pain, weakness, and new hemoptysis. There is no hematemesis. Patient's history of malignancy is notable for prior cystectomy, nephrectomy.  Her remaining kidney is on the right side. She receives her oncologic care at [facility]. 2 days ago she saw her primary care clinic, had blood work performed, and was referred for additional studies.  Past Medical History:  Diagnosis Date  . Cancer [facility])    bladder cancer  . GERD (gastroesophageal reflux disease)   . Hypertension   . Pancreatic mass   . Renal disorder   . Thyroid disease     Patient Active Problem List   Diagnosis Date Noted  . Acute renal failure (ARF) ([place]) 09/30/2019  . Chest tightness 08/13/2018  . Hypoxia 08/13/2018  . Hypomagnesemia 08/13/2018  . Cancer ()   . Hypertension   . GERD (gastroesophageal reflux disease)     Past Surgical History:  Procedure Laterality Date  . ABDOMINAL HYSTERECTOMY    . BREAST BIOPSY Left    benign  . CHOLECYSTECTOMY    . CYSTOSCOPY    . KIDNEY SURGERY    . NASAL SEPTUM SURGERY    . REPLACEMENT TOTAL KNEE BILATERAL       OB History   No obstetric history on file.     Family History  Problem Relation Age of Onset  . Hypertension Mother   . Diabetes Other     Social History   Tobacco Use  . Smoking status: Never Smoker  . Smokeless tobacco: Never Used  Substance Use Topics  . Alcohol use: No  . Drug  use: No    Home Medications Prior to Admission medications   Medication Sig Start Date End Date Taking? Authorizing Provider  aspirin EC 81 MG tablet Take 81 mg by mouth daily.    Yes [provider]  bimatoprost (LUMIGAN) 0.01 % SOLN Place 1 drop into both eyes at bedtime. 04/15/19  Yes [provider]  calcitRIOL (ROCALTROL) 0.5 MCG capsule Take 0.5 mcg by mouth 3 (three) times a week. Monday, Wednesday and Friday   Yes [provider]  Cholecalciferol (VITAMIN D3) 50 MCG (2000 UT) capsule Take 2,000 Units by mouth 4 (four) times a week. Tuesday, Thursday, Saturday and Sunday   Yes [provider]  cycloSPORINE (RESTASIS) 0.05 % ophthalmic emulsion Place 1 drop into both eyes 2 (two) times daily.    Yes [provider]  DULoxetine (CYMBALTA) 30 MG capsule Take 90 mg by mouth every morning.    Yes [provider]  fluticasone (FLONASE) 50 MCG/ACT nasal spray Place 1 spray into the nose daily.    Yes [provider]  levothyroxine (SYNTHROID, LEVOTHROID) 100 MCG tablet Take 100 mcg by mouth daily before breakfast.    Yes [provider]  losartan (COZAAR) 50 MG tablet Take 50 mg by mouth daily. 09/12/19  Yes [provider]  Multiple Vitamin (MULTIVITAMIN) capsule Take 1 capsule by mouth daily.  Yes [provider]  ondansetron (ZOFRAN) 4 MG tablet Take 4 mg by mouth every 4 (four) hours as needed. 04/16/19  Yes [provider]  Probiotic Product (ALIGN) 4 MG CAPS Take 1 capsule by mouth daily.   Yes [provider]  rOPINIRole (REQUIP) 2 MG tablet Take 4 mg by mouth at bedtime.    Yes [provider]  traZODone (DESYREL) 150 MG tablet Take 150 mg by mouth at bedtime.    Yes [provider]    Allergies    Iodinated diagnostic agents, Sulfamethoxazole-trimethoprim, and Ciprofloxacin  Review of Systems   Review of Systems  Constitutional:       Per HPI, otherwise  negative  HENT:       Per HPI, otherwise negative  Respiratory:       Per HPI, otherwise negative  Cardiovascular:       Per HPI, otherwise negative  Gastrointestinal: Positive for abdominal pain. Negative for vomiting.  Endocrine:       Negative aside from HPI  Genitourinary:       Neg aside from HPI   Musculoskeletal:       Per HPI, otherwise negative  Skin: Negative.   Allergic/Immunologic: Positive for immunocompromised state.  Neurological: Negative for syncope.    Physical Exam Updated Vital Signs BP 136/84   Pulse 63   Temp 98.2 F (36.8 C) (Oral)   Resp 20   Ht 5\' 6"  (1.676 m)   Wt 88 kg   SpO2 100%   BMI 31.31 kg/m   Physical Exam Vitals and nursing note reviewed.  Constitutional:      General: She is not in acute distress.    Appearance: She is well-developed.  HENT:     Head: Normocephalic and atraumatic.  Eyes:     Conjunctiva/sclera: Conjunctivae normal.  Cardiovascular:     Rate and Rhythm: Normal rate and regular rhythm.  Pulmonary:     Effort: Pulmonary effort is normal. No respiratory distress.     Breath sounds: Normal breath sounds. No stridor.  Abdominal:     General: There is no distension.     Tenderness: There is abdominal tenderness. There is guarding.  Skin:    General: Skin is warm and dry.     Coloration: Skin is pale.  Neurological:     Mental Status: She is alert and oriented to person, place, and time.     Cranial Nerves: No cranial nerve deficit.      ED Results / Procedures / Treatments   Labs (all labs ordered are listed, but only abnormal results are displayed) Labs Reviewed  BASIC METABOLIC PANEL - Abnormal; Notable for the following components:      Result Value   Sodium 134 (*)    Potassium 7.3 (*)    CO2 16 (*)    Glucose, Bld 142 (*)    BUN 115 (*)    Creatinine, Ser 15.02 (*)    GFR calc non Af Amer 2 (*)    GFR calc Af Amer 3 (*)    All other components within normal limits  CBC - Abnormal; Notable for  the following components:   WBC 11.0 (*)    RBC 3.24 (*)    Hemoglobin 8.4 (*)    HCT 27.7 (*)    MCH 25.9 (*)    RDW 15.7 (*)    Platelets 580 (*)    All other components within normal limits  URINALYSIS, ROUTINE W REFLEX MICROSCOPIC -  Abnormal; Notable for the following components:   APPearance TURBID (*)    Hgb urine dipstick LARGE (*)    Protein, ur 100 (*)    Leukocytes,Ua LARGE (*)    WBC, UA >50 (*)    Bacteria, UA MANY (*)    All other components within normal limits  I-STAT CHEM 8, ED - Abnormal; Notable for the following components:   Sodium 134 (*)    Potassium 7.0 (*)    BUN 122 (*)    Creatinine, Ser 17.20 (*)    TCO2 19 (*)    Hemoglobin 8.8 (*)    HCT 26.0 (*)    All other components within normal limits  SARS CORONAVIRUS 2 BY RT PCR (HOSPITAL ORDER, [place] [facility])  URINE CULTURE  PROTIME-INR  CBG MONITORING, ED    EKG EKG Interpretation  Date/Time:  Monday September 30 2019 16:20:54 EDT Ventricular Rate:  70 PR Interval:  178 QRS Duration: 76 QT Interval:  342 QTC Calculation: 369 R Axis:   67 Text Interpretation: Normal sinus rhythm Possible Anterior infarct , age undetermined Abnormal ECG Confirmed by Carmin Muskrat [phone]) on 09/30/2019 5:34:23 PM   Radiology CT Abdomen Pelvis Wo Contrast  Result Date: 09/30/2019 CLINICAL DATA:  Generalized weakness, abdominal pain, hemoptysis, history of bladder cancer EXAM: CT ABDOMEN AND PELVIS WITHOUT CONTRAST TECHNIQUE: Multidetector CT imaging of the abdomen and pelvis was performed following the standard protocol without IV contrast. COMPARISON:  04/08/2017 FINDINGS: Lower chest: No acute pleural or parenchymal lung disease. Hepatobiliary: No focal liver abnormality is seen. Status post cholecystectomy. No biliary dilatation. Pancreas: Unremarkable. No pancreatic ductal dilatation or surrounding inflammatory changes. Spleen: Normal in size without focal abnormality. Adrenals/Urinary Tract:  Postsurgical changes are seen from left nephro ureterectomy and cystectomy. There is significant distension of the right renal collecting system and right ureter, which may be related to diverting urostomy. There is a drainage catheter coiled within the urinary diversion right lower quadrant. Stomach/Bowel: No bowel obstruction or ileus. Diverticulosis of the descending and sigmoid colon without diverticulitis. No bowel wall thickening or inflammatory change. Vascular/Lymphatic: Aortic atherosclerosis. No enlarged abdominal or pelvic lymph nodes. Reproductive: Status post hysterectomy. No adnexal masses. Other: No free fluid or free gas. Small fat containing left inguinal hernia unchanged. Musculoskeletal: No acute or destructive bony lesions. Reconstructed images demonstrate no additional findings. IMPRESSION: 1. Obstructive uropathy within the right kidney and right ureter, of uncertain etiology. This could be due, in part, to the urinary diversion in the right lower quadrant. 2. Postsurgical changes from a left nephro ureterectomy and cystectomy. 3. Diverticulosis without diverticulitis. Electronically Signed   By: Randa Ngo M.D.   On: 09/30/2019 18:50   DG Chest Port 1 View  Result Date: 09/30/2019 CLINICAL DATA:  Hemoptysis EXAM: PORTABLE CHEST 1 VIEW COMPARISON:  None. FINDINGS: There is mild cardiomegaly. Both lungs are clear. No large airspace consolidation or pleural effusion. The visualized skeletal structures are unremarkable. IMPRESSION: No acute cardiopulmonary process Electronically Signed   By: Prudencio Pair M.D.   On: 09/30/2019 18:20    Procedures Procedures (including critical care time)  Medications Ordered in ED Medications  calcium gluconate 2 g/ 100 mL sodium chloride IVPB (has no administration in time range)  fentaNYL (SUBLIMAZE) injection 50 mcg (has no administration in time range)  sodium chloride 0.9 % bolus 1,000 mL (1,000 mLs Intravenous New Bag/Given 09/30/19 1759)    albuterol (PROVENTIL) (2.5 MG/3ML) 0.083% nebulizer solution 5 mg (5 mg Nebulization  Given 09/30/19 1932)  insulin aspart (novoLOG) injection 5 Units (5 Units Intravenous Given 09/30/19 1927)    And  dextrose 50 % solution 50 mL (50 mLs Intravenous Given 09/30/19 1927)  sodium bicarbonate injection 50 mEq (50 mEq Intravenous Given 09/30/19 1928)  sodium zirconium cyclosilicate (LOKELMA) packet 5 g (5 g Oral Given 09/30/19 1925)    ED Course  I have reviewed the triage vital signs and the nursing notes.  Pertinent labs & imaging results that were available during my care of the patient were reviewed by me and considered in my medical decision making (see chart for details).   Patient with broad differential including return of malignancy, infection, perforation, electrolyte abnormalities considered.  X-ray, CT, labs ordered after initial evaluation.   Update:, Initial labs notable for substantial renal dysfunction.  Repeat panel sent.  Update:, Repeat panel similar, with acute kidney injury, and on obtaining the patient's outside hospital records, is clear that her creatinine in the last month was less than 4, is currently greater than 15.  In addition, patient did have hyperkalemia greater than 7. EKG with slightly peaked T waves. However, on repeat exam the patient is awake and alert. With concern for hyper kalemia, the patient received appropriate meds, fluids calcium albuterol insulin dextrose bicarbonate. As the patient is a cancer patient at another facility spoke with practitioners there, urology, Dr. Vivianne Spence.  [facility] has no available capacity.  As such, I discussed her case with our nephrology, urology and interventional radiology colleagues here. Patient will be admitted to the internal medicine/hospitalist team, nephrology, urology consulted, and the patient will have interventional radiology percutaneous nephrostomy drainage performed.   Final Clinical  Impression(s) / ED Diagnoses Final diagnoses:  Obstruction of kidney  AKI (acute kidney injury) ([place])  Hyperkalemia   MDM Number of Diagnoses or Management Options AKI (acute kidney injury) ([place]): new, needed workup Hyperkalemia: new, needed workup Obstruction of kidney: new, needed workup   Amount and/or Complexity of Data Reviewed Clinical lab tests: reviewed Tests in the radiology section of CPT: reviewed Tests in the medicine section of CPT: reviewed Discussion of test results with the performing providers: yes Decide to obtain previous medical records or to obtain history from someone other than the patient: yes Obtain history from someone other than the patient: yes Review and summarize past medical records: yes Discuss the patient with other providers: yes Independent visualization of images, tracings, or specimens: yes  Risk of Complications, Morbidity, and/or Mortality Presenting problems: high Diagnostic procedures: high Management options: high  Critical Care Total time providing critical care: 30-74 minutes  Patient Progress Patient progress: stable    Carmin Muskrat, MD 09/30/19 2123

## 2019-09-30 NOTE — ED Triage Notes (Signed)
Pt reports generalized weakness for the past month and she has been coughing up blood a night for the past 3 weeks. Denies any blood in her stool. Pt a.o

## 2019-09-30 NOTE — Consult Note (Signed)
Urology Consult   Physician requesting consult: Carmin Muskrat, MD  Reason for consult: Hydronephrosis, renal failure  History of Present Illness: Shelia Cruz is a 69 y.o. female with history of non-muscle invasive left ureteral and bladder cancer s/p Left nephroureterectomy 05/16/18 and radical cystectomy with ileal conduit 05/15/19, most recently s/p right ureteral bander stent placement on 08/15/19 for ureteral obstruction, who presents with 3 weeks of right flank pain.  Patient reports severe intermittent right flank and upper abdominal pain, rating at 10/10, for the past 3 weeks. She also endorses recent hemoptysis and melena. She endorses a history of constipation. She denies recent fever at home. Patient reports that she had a stent placed in May, after which she was started on a 10 day course of antibiotic for UTI. She was initially doing well after this procedure, but then started having increasing flank pain. Was scheduled to see nephrology on Wednesday, but presented to the ED today due to severe pain.   Primary urologist is Dr Warrick Parisian at [facility].  On arrival to the ED today, labs are notable for creatinine of 15, potassium 7. CT shows malpositioned stent in the ileal conduit, with severe right hydronephrosis. Hb 8.8. Patient is afebrile and hemodynamically stable.     Past Medical History:  Diagnosis Date  . Cancer [facility])    bladder cancer  . GERD (gastroesophageal reflux disease)   . Hypertension   . Pancreatic mass   . Renal disorder   . Thyroid disease     Past Surgical History:  Procedure Laterality Date  . ABDOMINAL HYSTERECTOMY    . BREAST BIOPSY Left    benign  . CHOLECYSTECTOMY    . CYSTOSCOPY    . KIDNEY SURGERY    . NASAL SEPTUM SURGERY    . REPLACEMENT TOTAL KNEE BILATERAL       Current Hospital Medications:  Home meds:  No current facility-administered medications on file prior to encounter.   Current Outpatient Medications on File  Prior to Encounter  Medication Sig Dispense Refill  . aspirin EC 81 MG tablet Take 81 mg by mouth daily.     . bimatoprost (LUMIGAN) 0.01 % SOLN Place 1 drop into both eyes at bedtime.    . calcitRIOL (ROCALTROL) 0.5 MCG capsule Take 0.5 mcg by mouth 3 (three) times a week. Monday, Wednesday and Friday    . Cholecalciferol (VITAMIN D3) 50 MCG (2000 UT) capsule Take 2,000 Units by mouth 4 (four) times a week. Tuesday, Thursday, Saturday and Sunday    . cycloSPORINE (RESTASIS) 0.05 % ophthalmic emulsion Place 1 drop into both eyes 2 (two) times daily.     . DULoxetine (CYMBALTA) 30 MG capsule Take 90 mg by mouth every morning.     . fluticasone (FLONASE) 50 MCG/ACT nasal spray Place 1 spray into the nose daily.     Marland Kitchen levothyroxine (SYNTHROID, LEVOTHROID) 100 MCG tablet Take 100 mcg by mouth daily before breakfast.     . losartan (COZAAR) 50 MG tablet Take 50 mg by mouth daily.    . Multiple Vitamin (MULTIVITAMIN) capsule Take 1 capsule by mouth daily.    . ondansetron (ZOFRAN) 4 MG tablet Take 4 mg by mouth every 4 (four) hours as needed.    . Probiotic Product (ALIGN) 4 MG CAPS Take 1 capsule by mouth daily.    Marland Kitchen rOPINIRole (REQUIP) 2 MG tablet Take 4 mg by mouth at bedtime.     . traZODone (DESYREL) 150 MG tablet Take 150 mg  by mouth at bedtime.        Scheduled Meds: Continuous Infusions: . calcium gluconate     PRN Meds:.  Allergies:  Allergies  Allergen Reactions  . Iodinated Diagnostic Agents Other (See Comments)    Was told to avoid after being diagnosed with stage 3 kidney disease Was told to avoid after being diagnosed with stage 3 kidney disease   . Sulfamethoxazole-Trimethoprim Swelling    Facial swelling and rash  . Ciprofloxacin Nausea And Vomiting    Family History  Problem Relation Age of Onset  . Hypertension Mother   . Diabetes Other     Social History:  reports that she has never smoked. She has never used smokeless tobacco. She reports that she does not  drink alcohol and does not use drugs.  ROS: A complete review of systems was performed.  All systems are negative except for pertinent findings as noted.  Physical Exam:  Vital signs in last 24 hours: Temp:  [98.2 F (36.8 C)] 98.2 F (36.8 C) (07/12 1620) Pulse Rate:  [63-67] 63 (07/12 1932) Resp:  [18-20] 20 (07/12 1932) BP: (136)/(84) 136/84 (07/12 1620) SpO2:  [98 %-100 %] 100 % (07/12 1932) Weight:  [88 kg] 88 kg (07/12 1621) Constitutional:  Alert and oriented, No acute distress Cardiovascular: Regular rate and rhythm, No JVD Respiratory: Normal respiratory effort Abdomen: Tender to palpation in RUQ and flank. Urostomy pink and healthy in RLQ, clear urine in bag. A large portion of stent visible in the bag. GU: Right CVA tenderness Lymphatic: No lymphadenopathy Neurologic: Grossly intact, no focal deficits Psychiatric: Normal mood and affect  Laboratory Data:  Recent Labs    09/30/19 1629 09/30/19 1831  WBC 11.0*  --   HGB 8.4* 8.8*  HCT 27.7* 26.0*  PLT 580*  --     Recent Labs    09/30/19 1629 09/30/19 1831  NA 134* 134*  K 7.3* 7.0*  CL 103 104  GLUCOSE 142* 93  BUN 115* 122*  CALCIUM 9.3  --   CREATININE 15.02* 17.20*     Results for orders placed or performed during the hospital encounter of 09/30/19 (from the past 24 hour(s))  Basic metabolic panel     Status: Abnormal   Collection Time: 09/30/19  4:29 PM  Result Value Ref Range   Sodium 134 (L) 135 - 145 mmol/L   Potassium 7.3 (HH) 3.5 - 5.1 mmol/L   Chloride 103 98 - 111 mmol/L   CO2 16 (L) 22 - 32 mmol/L   Glucose, Bld 142 (H) 70 - 99 mg/dL   BUN 115 (H) 8 - 23 mg/dL   Creatinine, Ser 15.02 (H) 0.44 - 1.00 mg/dL   Calcium 9.3 8.9 - 10.3 mg/dL   GFR calc non Af Amer 2 (L) >60 mL/min   GFR calc Af Amer 3 (L) >60 mL/min   Anion gap 15 5 - 15  CBC     Status: Abnormal   Collection Time: 09/30/19  4:29 PM  Result Value Ref Range   WBC 11.0 (H) 4.0 - 10.5 K/uL   RBC 3.24 (L) 3.87 - 5.11  MIL/uL   Hemoglobin 8.4 (L) 12.0 - 15.0 g/dL   HCT 27.7 (L) 36 - 46 %   MCV 85.5 80.0 - 100.0 fL   MCH 25.9 (L) 26.0 - 34.0 pg   MCHC 30.3 30.0 - 36.0 g/dL   RDW 15.7 (H) 11.5 - 15.5 %   Platelets 580 (H) 150 - 400 K/uL  nRBC 0.0 0.0 - 0.2 %  Urinalysis, Routine w reflex microscopic     Status: Abnormal   Collection Time: 09/30/19  6:00 PM  Result Value Ref Range   Color, Urine YELLOW YELLOW   APPearance TURBID (A) CLEAR   Specific Gravity, Urine 1.006 1.005 - 1.030   pH 5.0 5.0 - 8.0   Glucose, UA NEGATIVE NEGATIVE mg/dL   Hgb urine dipstick LARGE (A) NEGATIVE   Bilirubin Urine NEGATIVE NEGATIVE   Ketones, ur NEGATIVE NEGATIVE mg/dL   Protein, ur 100 (A) NEGATIVE mg/dL   Nitrite NEGATIVE NEGATIVE   Leukocytes,Ua LARGE (A) NEGATIVE   RBC / HPF 21-50 0 - 5 RBC/hpf   WBC, UA >50 (H) 0 - 5 WBC/hpf   Bacteria, UA MANY (A) NONE SEEN   WBC Clumps PRESENT   I-stat chem 8, ED (not at [facility] or [facility])     Status: Abnormal   Collection Time: 09/30/19  6:31 PM  Result Value Ref Range   Sodium 134 (L) 135 - 145 mmol/L   Potassium 7.0 (HH) 3.5 - 5.1 mmol/L   Chloride 104 98 - 111 mmol/L   BUN 122 (H) 8 - 23 mg/dL   Creatinine, Ser 17.20 (H) 0.44 - 1.00 mg/dL   Glucose, Bld 93 70 - 99 mg/dL   Calcium, Ion 1.27 1.15 - 1.40 mmol/L   TCO2 19 (L) 22 - 32 mmol/L   Hemoglobin 8.8 (L) 12.0 - 15.0 g/dL   HCT 26.0 (L) 36 - 46 %   No results found for this or any previous visit (from the past 240 hour(s)).  Renal Function: Recent Labs    09/30/19 1629 09/30/19 1831  CREATININE 15.02* 17.20*   Estimated Creatinine Clearance: 3.5 mL/min (A) (by C-G formula based on SCr of 17.2 mg/dL (H)).  Radiologic Imaging: CT Abdomen Pelvis Wo Contrast  Result Date: 09/30/2019 CLINICAL DATA:  Generalized weakness, abdominal pain, hemoptysis, history of bladder cancer EXAM: CT ABDOMEN AND PELVIS WITHOUT CONTRAST TECHNIQUE: Multidetector CT imaging of the abdomen and pelvis was performed following the  standard protocol without IV contrast. COMPARISON:  04/08/2017 FINDINGS: Lower chest: No acute pleural or parenchymal lung disease. Hepatobiliary: No focal liver abnormality is seen. Status post cholecystectomy. No biliary dilatation. Pancreas: Unremarkable. No pancreatic ductal dilatation or surrounding inflammatory changes. Spleen: Normal in size without focal abnormality. Adrenals/Urinary Tract: Postsurgical changes are seen from left nephro ureterectomy and cystectomy. There is significant distension of the right renal collecting system and right ureter, which may be related to diverting urostomy. There is a drainage catheter coiled within the urinary diversion right lower quadrant. Stomach/Bowel: No bowel obstruction or ileus. Diverticulosis of the descending and sigmoid colon without diverticulitis. No bowel wall thickening or inflammatory change. Vascular/Lymphatic: Aortic atherosclerosis. No enlarged abdominal or pelvic lymph nodes. Reproductive: Status post hysterectomy. No adnexal masses. Other: No free fluid or free gas. Small fat containing left inguinal hernia unchanged. Musculoskeletal: No acute or destructive bony lesions. Reconstructed images demonstrate no additional findings. IMPRESSION: 1. Obstructive uropathy within the right kidney and right ureter, of uncertain etiology. This could be due, in part, to the urinary diversion in the right lower quadrant. 2. Postsurgical changes from a left nephro ureterectomy and cystectomy. 3. Diverticulosis without diverticulitis. Electronically Signed   By: Randa Ngo M.D.   On: 09/30/2019 18:50   DG Chest Port 1 View  Result Date: 09/30/2019 CLINICAL DATA:  Hemoptysis EXAM: PORTABLE CHEST 1 VIEW COMPARISON:  None. FINDINGS: There is mild cardiomegaly. Both lungs  are clear. No large airspace consolidation or pleural effusion. The visualized skeletal structures are unremarkable. IMPRESSION: No acute cardiopulmonary process Electronically Signed   By:  Prudencio Pair M.D.   On: 09/30/2019 18:20    I independently reviewed the above imaging studies.  Impression/Recommendation: 69 y.o. female with history of left nephroureterectomy and radical cystectomy with ileal conduit, with recent stent placement for ureteral obstruction, now with acute renal failure and CT showing hydroureteronephrosis of her solitary kidney with malpositioned right stent in the ileal conduit.  - Given severe renal failure with creatinine 15, potassium 7, and metabolic acidosis, would recommend urgent right nephrostomy tube placement with VIR. Discussed personally with VIR. If possible, would recommend antegrade nephrostogram if able at the time of nephrostomy tube placement. - Recommend sending urine culture from nephrostomy tube once placed. - Agree with medical admission in the setting of ARF and continued treatment of hyperkalemia   Carmie Kanner 09/30/2019, 8:57 PM

## 2019-09-30 NOTE — Consult Note (Signed)
Shelia Cruz Admit Date: 09/30/2019 09/30/2019 Shelia Cruz Requesting Physician:  Vanita Panda MD  Reason for Consult:  AKI, Hyperkalemia HPI:  89F PMH of metastatic bladder ca s/p L nephroureterectomy and radical cystectomy with ileal conduit and recent stent placement for R ureteral obstruction, HTN on ARB presented today with weakness, fatigue, and c/o hemoptysis.  Eval showed SCr 17, BUN 122, K 7.0.  CT A/P with progressive obstruction of R collecting system.    In ED: Albuterol, insulin/dextrose, NaHCO3, 1L NS, Lokelma. ED EKG w/o peaked Ts or widening in PR or QRS interval.   Urology consulted, recommended PCN, IR to see and likely place tonight.    Has had poor PO of late. Also with dec UOP.    Creatinine, Ser (mg/dL)  Date Value  09/30/2019 17.20 (H)  09/30/2019 15.02 (H)  08/14/2018 2.06 (H)  08/13/2018 2.09 (H)  08/12/2018 2.22 (H)  04/08/2017 1.45 (H)  04/03/2017 1.30 (H)  ]  ROS NSAIDS: denies use IV Contrast no exposure TMP/SMX no exposure Hypotension not present Balance of 12 systems is negative w/ exceptions as above  PMH  Past Medical History:  Diagnosis Date  . Cancer [facility])    bladder cancer  . GERD (gastroesophageal reflux disease)   . Hypertension   . Pancreatic mass   . Renal disorder   . Thyroid disease    PSH  Past Surgical History:  Procedure Laterality Date  . ABDOMINAL HYSTERECTOMY    . BREAST BIOPSY Left    benign  . CHOLECYSTECTOMY    . CYSTOSCOPY    . KIDNEY SURGERY    . NASAL SEPTUM SURGERY    . REPLACEMENT TOTAL KNEE BILATERAL     FH  Family History  Problem Relation Age of Onset  . Hypertension Mother   . Diabetes Other    SH  reports that she has never smoked. She has never used smokeless tobacco. She reports that she does not drink alcohol and does not use drugs. Allergies  Allergies  Allergen Reactions  . Iodinated Diagnostic Agents Other (See Comments)    Was told to avoid after being diagnosed with stage 3  kidney disease Was told to avoid after being diagnosed with stage 3 kidney disease   . Sulfamethoxazole-Trimethoprim Swelling    Facial swelling and rash  . Ciprofloxacin Nausea And Vomiting   Home medications Prior to Admission medications   Medication Sig Start Date End Date Taking? Authorizing Provider  aspirin EC 81 MG tablet Take 81 mg by mouth daily.    Yes [provider]  bimatoprost (LUMIGAN) 0.01 % SOLN Place 1 drop into both eyes at bedtime. 04/15/19  Yes [provider]  calcitRIOL (ROCALTROL) 0.5 MCG capsule Take 0.5 mcg by mouth 3 (three) times a week. Monday, Wednesday and Friday   Yes [provider]  Cholecalciferol (VITAMIN D3) 50 MCG (2000 UT) capsule Take 2,000 Units by mouth 4 (four) times a week. Tuesday, Thursday, Saturday and Sunday   Yes [provider]  cycloSPORINE (RESTASIS) 0.05 % ophthalmic emulsion Place 1 drop into both eyes 2 (two) times daily.    Yes [provider]  DULoxetine (CYMBALTA) 30 MG capsule Take 90 mg by mouth every morning.    Yes [provider]  fluticasone (FLONASE) 50 MCG/ACT nasal spray Place 1 spray into the nose daily.    Yes [provider]  levothyroxine (SYNTHROID, LEVOTHROID) 100 MCG tablet Take 100 mcg by mouth daily before breakfast.    Yes [provider]  losartan (COZAAR) 50 MG tablet Take 50 mg by mouth daily. 09/12/19  Yes [provider]  Multiple Vitamin (MULTIVITAMIN) capsule Take 1 capsule by mouth daily.   Yes [provider]  ondansetron (ZOFRAN) 4 MG tablet Take 4 mg by mouth every 4 (four) hours as needed. 04/16/19  Yes [provider]  Probiotic Product (ALIGN) 4 MG CAPS Take 1 capsule by mouth daily.   Yes [provider]  rOPINIRole (REQUIP) 2 MG tablet Take 4 mg by mouth at bedtime.    Yes [provider]  traZODone (DESYREL) 150 MG tablet Take 150 mg by mouth at bedtime.    Yes [provider]     Current Medications Scheduled Meds: . fentaNYL      . midazolam      . lidocaine       Continuous Infusions: . calcium gluconate     PRN Meds:.fentaNYL (SUBLIMAZE) injection  CBC Recent Labs  Lab 09/30/19 1629 09/30/19 1831  WBC 11.0*  --   HGB 8.4* 8.8*  HCT 27.7* 26.0*  MCV 85.5  --   PLT 580*  --    Basic Metabolic Panel Recent Labs  Lab 09/30/19 1629 09/30/19 1831  NA 134* 134*  K 7.3* 7.0*  CL 103 104  CO2 16*  --   GLUCOSE 142* 93  BUN 115* 122*  CREATININE 15.02* 17.20*  CALCIUM 9.3  --     Physical Exam  Blood pressure 136/84, pulse 63, temperature 98.2 F (36.8 C), temperature source Oral, resp. rate 20, height 5\' 6"  (1.676 m), weight 88 kg, SpO2 100 %. GEN: NAD ENT: NCAT EYES: EOMI CV: RRR nl s1s2 PULM: CTAB ABD: Urostomy with clear yellow urine SKIN: s/nt/nd EXT:no LEE   Assessment 41F Severe AKI and Hyperkalemia with R sided urostomy, likely worsened obstruction.  1. AKI, likely obstructive, for IR placement of PCN tonight 2. Severe hyperkalemia, s/p med mgmt in ED, EKG reassuring, hopefully will improved after PCN 3. Metastatic bladder cancer s/p L nephroureterectomy and radical cystectomy with R sided ileal conduit 4. Metabolic Acidosis related to #1 5. Anemia, Hb 8s 6. HTN on ARB  Plan 1. PCN tonight 2. Trend BMP q4h moving forward 3. Hold ARB 4. If fails to improve K and BUN/SCr could consider short term HD support   Shelia Cruz  09/30/2019, 9:34 PM

## 2019-09-30 NOTE — Procedures (Signed)
Pre Procedure Dx: Malpositioned and functioning right sided nephroureteral catheter Post Procedure Dx: Same  Successful fluoroscopic guided exchange, repositioning and up-sizing of now 12 Fr right sided nephroureteral catheter via ostomy with end coiled in the renal pelvis.   EBL: None  No immediate complications.   Ronny Bacon, MD Pager #: [phone]

## 2019-09-30 NOTE — H&P (Addendum)
[facility] History and Physical  Shelia Cruz ZJQ:[id number] DOB: 05-02-1950 DOA: 09/30/2019  Referring EDP: Vanita Panda PCP: Kathyrn Lass, MD   Chief Complaint: Weakness, hemoptysis  HPI: Shelia Cruz is a 69 y.o. female with PMH of metastatic bladder cancer s/p left nephroureterectomy and radical cystectomy with ileal conduit and recent stent placement for ureteral obstruction as well as HTN and hypothyroidism presented to ED with weakness and hemoptysis and admitted for acute renal failure, hyperkalemia and hydroureteronephrosis.  Patient reports three weeks of right-sided flank pain and abdominal pain that has been worse than her baseline abdominal pain. Pain is intermittent and sharp. Reports that after a stent placement in May with course of antibiotics, she has never felt back to where she was prior to that. Along with abdominal pain, reports melena and hemoptysis/occasional coughing. PO intake has been less in last several weeks. Stools are dark and "coffee-ground" in appearance. She has attributed her SOB to anxiety. She follows with [facility] for oncology and was planning to see Neprhology there this Wednesday. Denies headache, dizziness, fever, chills, chest pain, nausea, vomiting, dysuria, hematuria, hematochezia, difficulty moving arms/legs, speech difficulty, confusion or any other complaints.  In the ED: Vitals stable on room air. Labs remarkable for Na 134, K 7.3, CO2 16, Cr 15.02, GFR 2, BUN 115, WBC 11.0, Cr 8.4, UA with large leuks and many bacteria.  CXR: non-acute CT Abd/Pevl:  1. Obstructive uropathy within the right kidney and right ureter, of uncertain etiology. This could be due, in part, to the urinary diversion in the right lower quadrant. 2. Postsurgical changes from a left nephro ureterectomy and cystectomy. 3. Diverticulosis without diverticulitis.  Patient was given Albuterol, insulin/D50, Sodium Bicarb, Lokelma, calcium gluconate (ordered)  and IVF. Urology, Renal and IR consulted. Urology recommended urgent right nephrostomy tube placement with IR.  Admission to medicine for ARF and hyperK.   Review of Systems:  All other systems negative unless noted above in HPI.   Past Medical History:  Diagnosis Date  . Cancer [facility])    bladder cancer  . GERD (gastroesophageal reflux disease)   . Hypertension   . Pancreatic mass   . Renal disorder   . Thyroid disease    Past Surgical History:  Procedure Laterality Date  . ABDOMINAL HYSTERECTOMY    . BREAST BIOPSY Left    benign  . CHOLECYSTECTOMY    . CYSTOSCOPY    . KIDNEY SURGERY    . NASAL SEPTUM SURGERY    . REPLACEMENT TOTAL KNEE BILATERAL     Social History:  reports that she has never smoked. She has never used smokeless tobacco. She reports that she does not drink alcohol and does not use drugs.  Allergies  Allergen Reactions  . Iodinated Diagnostic Agents Other (See Comments)    Was told to avoid after being diagnosed with stage 3 kidney disease Was told to avoid after being diagnosed with stage 3 kidney disease   . Sulfamethoxazole-Trimethoprim Swelling    Facial swelling and rash  . Ciprofloxacin Nausea And Vomiting    Family History  Problem Relation Age of Onset  . Hypertension Mother   . Diabetes Other     Prior to Admission medications   Medication Sig Start Date End Date Taking? Authorizing Provider  aspirin EC 81 MG tablet Take 81 mg by mouth daily.    Yes [provider]  bimatoprost (LUMIGAN) 0.01 % SOLN Place 1 drop into both eyes at bedtime. 04/15/19  Yes [provider]  calcitRIOL (ROCALTROL) 0.5 MCG capsule Take 0.5 mcg by mouth 3 (three) times a week. Monday, Wednesday and Friday   Yes [provider]  Cholecalciferol (VITAMIN D3) 50 MCG (2000 UT) capsule Take 2,000 Units by mouth 4 (four) times a week. Tuesday, Thursday, Saturday and Sunday   Yes [provider]  cycloSPORINE (RESTASIS) 0.05 % ophthalmic  emulsion Place 1 drop into both eyes 2 (two) times daily.    Yes [provider]  DULoxetine (CYMBALTA) 30 MG capsule Take 90 mg by mouth every morning.    Yes [provider]  fluticasone (FLONASE) 50 MCG/ACT nasal spray Place 1 spray into the nose daily.    Yes [provider]  levothyroxine (SYNTHROID, LEVOTHROID) 100 MCG tablet Take 100 mcg by mouth daily before breakfast.    Yes [provider]  losartan (COZAAR) 50 MG tablet Take 50 mg by mouth daily. 09/12/19  Yes [provider]  Multiple Vitamin (MULTIVITAMIN) capsule Take 1 capsule by mouth daily.   Yes [provider]  ondansetron (ZOFRAN) 4 MG tablet Take 4 mg by mouth every 4 (four) hours as needed. 04/16/19  Yes [provider]  Probiotic Product (ALIGN) 4 MG CAPS Take 1 capsule by mouth daily.   Yes [provider]  rOPINIRole (REQUIP) 2 MG tablet Take 4 mg by mouth at bedtime.    Yes [provider]  traZODone (DESYREL) 150 MG tablet Take 150 mg by mouth at bedtime.    Yes [provider]   Physical Exam: Vitals:   09/30/19 1620 09/30/19 1621 09/30/19 1932  BP: 136/84    Pulse: 67  63  Resp: 18  20  Temp: 98.2 F (36.8 C)    TempSrc: Oral    SpO2: 98%  100%  Weight:  88 kg   Height:  5\' 6"  (1.676 m)     Wt Readings from Last 3 Encounters:  09/30/19 88 kg  08/14/18 86.3 kg  06/21/18 86.6 kg    . General:  Appears calm and comfortable. AAOx4. Appears pale.  . Eyes: EOMI, normal lids, irises & conjunctiva . ENT: grossly normal hearing, lips & tongue . Neck: normal ROM . Cardiovascular: RRR, no m/r/g. No LE edema. Marland Kitchen Respiratory: CTA bilaterally, no w/r/r. Normal respiratory effort. . Abdomen: Soft without distention but significant tenderness diffusely present with guarding. TTP most noted in RUQ and right CVA. Normoactive bowel sounds. Ostomy with clear urine in place and area surrounding without significant erythema, drainage,  inflammation, warmth.  . Skin: no rash or induration seen on limited exam . Musculoskeletal: grossly normal tone BUE/BLE . Psychiatric: grossly normal mood and affect, speech fluent and appropriate . Neurologic: grossly non-focal.          Labs on Admission:  Basic Metabolic Panel: Recent Labs  Lab 09/30/19 1629 09/30/19 1831  NA 134* 134*  K 7.3* 7.0*  CL 103 104  CO2 16*  --   GLUCOSE 142* 93  BUN 115* 122*  CREATININE 15.02* 17.20*  CALCIUM 9.3  --    Liver Function Tests: No results for input(s): AST, ALT, ALKPHOS, BILITOT, PROT, ALBUMIN in the last 168 hours. No results for input(s): LIPASE, AMYLASE in the last 168 hours. No results for input(s): AMMONIA in the last 168 hours. CBC: Recent Labs  Lab 09/30/19 1629 09/30/19 1831  WBC 11.0*  --   HGB 8.4* 8.8*  HCT 27.7* 26.0*  MCV 85.5  --   PLT 580*  --  Cardiac Enzymes: No results for input(s): CKTOTAL, CKMB, CKMBINDEX, TROPONINI in the last 168 hours.  BNP (last 3 results) No results for input(s): BNP in the last 8760 hours.  ProBNP (last 3 results) No results for input(s): PROBNP in the last 8760 hours.  CBG: Recent Labs  Lab 09/30/19 2109  GLUCAP 68    Radiological Exams on Admission: CT Abdomen Pelvis Wo Contrast  Result Date: 09/30/2019 CLINICAL DATA:  Generalized weakness, abdominal pain, hemoptysis, history of bladder cancer EXAM: CT ABDOMEN AND PELVIS WITHOUT CONTRAST TECHNIQUE: Multidetector CT imaging of the abdomen and pelvis was performed following the standard protocol without IV contrast. COMPARISON:  04/08/2017 FINDINGS: Lower chest: No acute pleural or parenchymal lung disease. Hepatobiliary: No focal liver abnormality is seen. Status post cholecystectomy. No biliary dilatation. Pancreas: Unremarkable. No pancreatic ductal dilatation or surrounding inflammatory changes. Spleen: Normal in size without focal abnormality. Adrenals/Urinary Tract: Postsurgical changes are seen from left nephro  ureterectomy and cystectomy. There is significant distension of the right renal collecting system and right ureter, which may be related to diverting urostomy. There is a drainage catheter coiled within the urinary diversion right lower quadrant. Stomach/Bowel: No bowel obstruction or ileus. Diverticulosis of the descending and sigmoid colon without diverticulitis. No bowel wall thickening or inflammatory change. Vascular/Lymphatic: Aortic atherosclerosis. No enlarged abdominal or pelvic lymph nodes. Reproductive: Status post hysterectomy. No adnexal masses. Other: No free fluid or free gas. Small fat containing left inguinal hernia unchanged. Musculoskeletal: No acute or destructive bony lesions. Reconstructed images demonstrate no additional findings. IMPRESSION: 1. Obstructive uropathy within the right kidney and right ureter, of uncertain etiology. This could be due, in part, to the urinary diversion in the right lower quadrant. 2. Postsurgical changes from a left nephro ureterectomy and cystectomy. 3. Diverticulosis without diverticulitis. Electronically Signed   By: Randa Ngo M.D.   On: 09/30/2019 18:50   DG Chest Port 1 View  Result Date: 09/30/2019 CLINICAL DATA:  Hemoptysis EXAM: PORTABLE CHEST 1 VIEW COMPARISON:  None. FINDINGS: There is mild cardiomegaly. Both lungs are clear. No large airspace consolidation or pleural effusion. The visualized skeletal structures are unremarkable. IMPRESSION: No acute cardiopulmonary process Electronically Signed   By: Prudencio Pair M.D.   On: 09/30/2019 18:20    EKG: Independently reviewed. HR 70. Sinus rhythm. QTc 369. No STEMI. No peaked T waves. No significant change when compared to May 2020.  Assessment/Plan Principal Problem:   Acute renal failure (ARF) (HCC) Active Problems:   Essential hypertension   Hyperkalemia   History of bladder cancer   History of left nephrectomy   History of total cystectomy   Metabolic acidosis   Hemoptysis    Hypothyroidism   Restless legs syndrome   Ureteral obstruction   Acute blood loss anemia  69 y.o. female with PMH of metastatic bladder cancer s/p left nephroureterectomy and radical cystectomy with ileal conduit and recent stent placement for ureteral obstruction as well as HTN and hypothyroidism presented to ED with weakness and hemoptysis and admitted for acute renal failure, hyperkalemia and hydroureteronephrosis.  Acute renal Failure on CKD Stage 3 Hydroureteronephrosis Hyperkalemia Metabolic Acidosis - hx 3 weeks of intermittent, sharp RUQ/right-sided abdominal pain in setting of hx of metastatic bladder cancer s/p left nephroureterectomy and radical cystectomy with ileal conduit and recent stent placement for ureteral obstruction - presents with Cr 17.20, baseline ~ 2.0 - K 7.3 - Urology, Nephrology and IR consulted - Urology recommends urgent right nephrostomy tube placement with IR - Nephrology planning for  PCN tonight  - BMP q4h - Tele - s/p Albuterol, insulin/D50, Sodium Bicarb, Lokelma, calcium gluconate - Hold nephrotoxic agents - NPO - Fentanyl for pain control - Zofran PRN  Hemoptysis with subsequent melena Hx Metastatic Bladder Cancer - 3 week history of hemoptysis and melena - Hgb 8.4 on admission; previously 12.3 in May 2020 - Type and Screen ordered - PPI BID - D-dimer likely not useful in setting of ARF - Renal function will not tolerate CTA Chest; could consider VQ scan; vitals stable on room air without tachypnea, tachycardia, fever or hypoxia - Could consider GI consult for EGD  HTN - hold home Losartan for renal failure  Hypothyroidism - cont home Synthroid  Restless Legs, MDD, Chronic Pain, Insomnia - hold home Cymbalta due to kidney function - Trazodone PRN qhs - Reduce Requip to 2 mg qhs PRN due to kidney function  Code Status: DNR/DNI. Declines chest compressions and intubation. Okay with NIV, pressors, ICU-level care. Reports she has a living  will in place. DVT Prophylaxis: None; pre-procedural Family Communication: Best friend at bedside Disposition Plan: Admit to inpatient. Requiring specialty consultation, procedure and monitoring of acute lab abnormalities. Patient is at high risk for further decompensation due to age and co-morbidities.    Time spent: 70 minutes  Chauncey Mann, MD [facility] Pager [phone]

## 2019-10-01 ENCOUNTER — Inpatient Hospital Stay (HOSPITAL_COMMUNITY): Payer: Medicare Other

## 2019-10-01 DIAGNOSIS — N179 Acute kidney failure, unspecified: Principal | ICD-10-CM

## 2019-10-01 DIAGNOSIS — D62 Acute posthemorrhagic anemia: Secondary | ICD-10-CM

## 2019-10-01 DIAGNOSIS — I10 Essential (primary) hypertension: Secondary | ICD-10-CM

## 2019-10-01 LAB — RENAL FUNCTION PANEL
Albumin: 3 g/dL — ABNORMAL LOW (ref 3.5–5.0)
Albumin: 2.8 g/dL — ABNORMAL LOW (ref 3.5–5.0)
Anion gap: 17 — ABNORMAL HIGH (ref 5–15)
Anion gap: 16 — ABNORMAL HIGH (ref 5–15)
BUN: 109 mg/dL — ABNORMAL HIGH (ref 8–23)
BUN: 109 mg/dL — ABNORMAL HIGH (ref 8–23)
CO2: 18 mmol/L — ABNORMAL LOW (ref 22–32)
CO2: 20 mmol/L — ABNORMAL LOW (ref 22–32)
Calcium: 9.7 mg/dL (ref 8.9–10.3)
Calcium: 8.9 mg/dL (ref 8.9–10.3)
Chloride: 104 mmol/L (ref 98–111)
Chloride: 101 mmol/L (ref 98–111)
Creatinine, Ser: 14.18 mg/dL — ABNORMAL HIGH (ref 0.44–1.00)
Creatinine, Ser: 13.47 mg/dL — ABNORMAL HIGH (ref 0.44–1.00)
GFR calc Af Amer: 3 mL/min — ABNORMAL LOW (ref >60)
GFR calc Af Amer: 3 mL/min — ABNORMAL LOW (ref >60)
GFR calc non Af Amer: 2 mL/min — ABNORMAL LOW (ref >60)
GFR calc non Af Amer: 2 mL/min — ABNORMAL LOW (ref >60)
Glucose, Bld: 140 mg/dL — ABNORMAL HIGH (ref 70–99)
Glucose, Bld: 116 mg/dL — ABNORMAL HIGH (ref 70–99)
Phosphorus: 9.6 mg/dL — ABNORMAL HIGH (ref 2.5–4.6)
Phosphorus: 8.4 mg/dL — ABNORMAL HIGH (ref 2.5–4.6)
Potassium: 6 mmol/L — ABNORMAL HIGH (ref 3.5–5.1)
Potassium: 5.9 mmol/L — ABNORMAL HIGH (ref 3.5–5.1)
Sodium: 139 mmol/L (ref 135–145)
Sodium: 137 mmol/L (ref 135–145)

## 2019-10-01 LAB — IRON AND TIBC
Iron: 18 ug/dL — ABNORMAL LOW (ref 28–170)
Saturation Ratios: 7 % — ABNORMAL LOW (ref 10.4–31.8)
TIBC: 246 ug/dL — ABNORMAL LOW (ref 250–450)
UIBC: 228 ug/dL

## 2019-10-01 LAB — BASIC METABOLIC PANEL
Anion gap: 15 (ref 5–15)
Anion gap: 14 (ref 5–15)
Anion gap: 15 (ref 5–15)
BUN: 113 mg/dL — ABNORMAL HIGH (ref 8–23)
BUN: 112 mg/dL — ABNORMAL HIGH (ref 8–23)
BUN: 111 mg/dL — ABNORMAL HIGH (ref 8–23)
CO2: 18 mmol/L — ABNORMAL LOW (ref 22–32)
CO2: 19 mmol/L — ABNORMAL LOW (ref 22–32)
CO2: 19 mmol/L — ABNORMAL LOW (ref 22–32)
Calcium: 9 mg/dL (ref 8.9–10.3)
Calcium: 9.5 mg/dL (ref 8.9–10.3)
Calcium: 9.6 mg/dL (ref 8.9–10.3)
Chloride: 105 mmol/L (ref 98–111)
Chloride: 105 mmol/L (ref 98–111)
Chloride: 104 mmol/L (ref 98–111)
Creatinine, Ser: 15.13 mg/dL — ABNORMAL HIGH (ref 0.44–1.00)
Creatinine, Ser: 14.56 mg/dL — ABNORMAL HIGH (ref 0.44–1.00)
Creatinine, Ser: 14.66 mg/dL — ABNORMAL HIGH (ref 0.44–1.00)
GFR calc Af Amer: 3 mL/min — ABNORMAL LOW (ref >60)
GFR calc Af Amer: 3 mL/min — ABNORMAL LOW (ref >60)
GFR calc Af Amer: 3 mL/min — ABNORMAL LOW (ref >60)
GFR calc non Af Amer: 2 mL/min — ABNORMAL LOW (ref >60)
GFR calc non Af Amer: 2 mL/min — ABNORMAL LOW (ref >60)
GFR calc non Af Amer: 2 mL/min — ABNORMAL LOW (ref >60)
Glucose, Bld: 103 mg/dL — ABNORMAL HIGH (ref 70–99)
Glucose, Bld: 160 mg/dL — ABNORMAL HIGH (ref 70–99)
Glucose, Bld: 145 mg/dL — ABNORMAL HIGH (ref 70–99)
Potassium: 6.4 mmol/L (ref 3.5–5.1)
Potassium: 5.8 mmol/L — ABNORMAL HIGH (ref 3.5–5.1)
Potassium: 5.8 mmol/L — ABNORMAL HIGH (ref 3.5–5.1)
Sodium: 138 mmol/L (ref 135–145)
Sodium: 138 mmol/L (ref 135–145)
Sodium: 138 mmol/L (ref 135–145)

## 2019-10-01 LAB — FOLATE: Folate: 35.8 ng/mL (ref >5.9)

## 2019-10-01 LAB — RETICULOCYTES
Immature Retic Fract: 8.7 % (ref 2.3–15.9)
RBC.: 2.98 MIL/uL — ABNORMAL LOW (ref 3.87–5.11)
Retic Count, Absolute: 37.8 10*3/uL (ref 19.0–186.0)
Retic Ct Pct: 1.3 % (ref 0.4–3.1)

## 2019-10-01 LAB — HEPATIC FUNCTION PANEL
ALT: 10 U/L (ref 0–44)
AST: 6 U/L — ABNORMAL LOW (ref 15–41)
Albumin: 2.8 g/dL — ABNORMAL LOW (ref 3.5–5.0)
Alkaline Phosphatase: 73 U/L (ref 38–126)
Bilirubin, Direct: <0.1 mg/dL (ref 0.0–0.2)
Total Bilirubin: 0.8 mg/dL (ref 0.3–1.2)
Total Protein: 5.9 g/dL — ABNORMAL LOW (ref 6.5–8.1)

## 2019-10-01 LAB — CBC
HCT: 25.6 % — ABNORMAL LOW (ref 36.0–46.0)
Hemoglobin: 7.8 g/dL — ABNORMAL LOW (ref 12.0–15.0)
MCH: 26.2 pg (ref 26.0–34.0)
MCHC: 30.5 g/dL (ref 30.0–36.0)
MCV: 85.9 fL (ref 80.0–100.0)
Platelets: 500 10*3/uL — ABNORMAL HIGH (ref 150–400)
RBC: 2.98 MIL/uL — ABNORMAL LOW (ref 3.87–5.11)
RDW: 15.8 % — ABNORMAL HIGH (ref 11.5–15.5)
WBC: 10.1 10*3/uL (ref 4.0–10.5)
nRBC: 0 % (ref 0.0–0.2)

## 2019-10-01 LAB — MAGNESIUM: Magnesium: 2.2 mg/dL (ref 1.7–2.4)

## 2019-10-01 LAB — VITAMIN B12: Vitamin B-12: 852 pg/mL (ref 180–914)

## 2019-10-01 LAB — FERRITIN: Ferritin: 146 ng/mL (ref 11–307)

## 2019-10-01 LAB — HIV ANTIBODY (ROUTINE TESTING W REFLEX): HIV Screen 4th Generation wRfx: NONREACTIVE

## 2019-10-01 LAB — GLUCOSE, CAPILLARY: Glucose-Capillary: 84 mg/dL (ref 70–99)

## 2019-10-01 MED ORDER — SODIUM ZIRCONIUM CYCLOSILICATE 10 G PO PACK
10.0000 g | PACK | Freq: Three times a day (TID) | ORAL | Status: AC
  Administered 2019-10-01 – 2019-10-02 (×3): 10 g via ORAL
  Filled 2019-10-01 (×3): qty 1

## 2019-10-01 MED ORDER — SODIUM CHLORIDE 0.9% FLUSH
10.0000 mL | Freq: Two times a day (BID) | INTRAVENOUS | Status: DC
  Administered 2019-10-04: 10 mL

## 2019-10-01 MED ORDER — TECHNETIUM TO 99M ALBUMIN AGGREGATED
4.2000 | Freq: Once | INTRAVENOUS | Status: AC | PRN
  Administered 2019-10-01: 4.2 via INTRAVENOUS

## 2019-10-01 MED ORDER — NEPRO/CARBSTEADY PO LIQD
237.0000 mL | Freq: Two times a day (BID) | ORAL | Status: DC
  Administered 2019-10-01 – 2019-10-04 (×7): 237 mL via ORAL

## 2019-10-01 MED ORDER — SODIUM ZIRCONIUM CYCLOSILICATE 10 G PO PACK: 10.0000 g | PACK | Freq: Three times a day (TID) | ORAL | Status: DC

## 2019-10-01 MED ORDER — ADULT MULTIVITAMIN W/MINERALS CH
1.0000 | ORAL_TABLET | Freq: Every day | ORAL | Status: DC
  Administered 2019-10-01 – 2019-10-04 (×4): 1 via ORAL
  Filled 2019-10-01 (×3): qty 1

## 2019-10-01 MED ORDER — SODIUM CHLORIDE 0.9 % IV BOLUS
500.0000 mL | Freq: Once | INTRAVENOUS | Status: AC
  Administered 2019-10-01: 500 mL via INTRAVENOUS

## 2019-10-01 MED ORDER — ALBUTEROL SULFATE (2.5 MG/3ML) 0.083% IN NEBU
10.0000 mg | INHALATION_SOLUTION | Freq: Once | RESPIRATORY_TRACT | Status: AC
  Administered 2019-10-01: 10 mg via RESPIRATORY_TRACT
  Filled 2019-10-01: qty 12

## 2019-10-01 MED ORDER — SODIUM BICARBONATE 8.4 % IV SOLN
50.0000 meq | Freq: Once | INTRAVENOUS | Status: AC
  Administered 2019-10-01: 50 meq via INTRAVENOUS
  Filled 2019-10-01: qty 50

## 2019-10-01 MED ORDER — SODIUM CHLORIDE 0.9% FLUSH: 10.0000 mL | INTRAVENOUS | Status: DC | PRN

## 2019-10-01 MED ORDER — SODIUM ZIRCONIUM CYCLOSILICATE 5 G PO PACK
5.0000 g | PACK | Freq: Every day | ORAL | Status: DC
  Administered 2019-10-01: 5 g via ORAL
  Filled 2019-10-01: qty 1

## 2019-10-01 MED ORDER — DEXTROSE 50 % IV SOLN
1.0000 | Freq: Once | INTRAVENOUS | Status: AC
  Administered 2019-10-01: 50 mL via INTRAVENOUS
  Filled 2019-10-01: qty 50

## 2019-10-01 MED ORDER — INSULIN ASPART 100 UNIT/ML IV SOLN
5.0000 [IU] | Freq: Once | INTRAVENOUS | Status: AC
  Administered 2019-10-01: 5 [IU] via INTRAVENOUS

## 2019-10-01 MED ORDER — FUROSEMIDE 10 MG/ML IJ SOLN
100.0000 mg | Freq: Once | INTRAVENOUS | Status: AC
  Administered 2019-10-01: 100 mg via INTRAVENOUS
  Filled 2019-10-01: qty 10

## 2019-10-01 NOTE — Progress Notes (Signed)
PROGRESS NOTE    Shelia Cruz  TDD:[id number] DOB: 1951/01/11 DOA: 09/30/2019 PCP: Kathyrn Lass, MD  Brief Narrative:  Shelia Cruz is a 69 y.o. female with PMH of metastatic bladder cancer s/p left nephroureterectomy and radical cystectomy with ileal conduit and recent stent placement for ureteral obstruction as well as HTN and hypothyroidism presented to ED with weakness and hemoptysis and admitted for acute renal failure, hyperkalemia and hydroureteronephrosis. Multiple symptoms since May notably right-sided flank pain, decreased urine output, abdominal pain, intermittent hemoptysis and dark stools off and on. - She follows with [facility] for urology/oncology and was planning to see Neprhology there this Wednesday. In the ED: Vitals stable Labs remarkable for Na 134, K 7.3, CO2 16, Cr 15.02, GFR 2, BUN 115, WBC 11.0, Cr 8.4, UA with large leuks and many bacteria. CT Abd/Pevl:  1. Obstructive uropathy within the right kidney and right ureter, of uncertain etiology. This could be due, in part, to the urinary diversion in the right lower quadrant. 2. Postsurgical changes from a left nephro ureterectomy and cystectomy. 3. Diverticulosis without diverticulitis. -Hyperkalemia treated with insulin/D50, Sodium Bicarb, Lokelma, calcium gluconate (ordered) and IVF. Urology, Renal and IR consulted. Urology recommended urgent right nephrostomy tube placement with IR.  -Interventional radiology noted malpositioned right nephroureteral catheter, underwent successful fluoroscopic exchange, repositioning and upsizing to 12 French right-sided nephroureteral catheter via ileal conduit with end in the renal pelvis  Assessment & Plan:   Acute renal Failure on CKD Stage 4 Hydroureteronephrosis, metastatic bladder cancer Hyperkalemia Metabolic Acidosis -Baseline creatinine is 2.5-2.8 from the last 1 to 2 months from care everywhere, admitted with creatinine of 15/17 with BUN of 115, potassium  of 7.3 -Secondary to malpositioned right nephroureteral catheter, ongoing ARB use  -Underwent emergent exchange and upsizing of right nephroureteral catheter overnight by IR -Urine output picking up, hyperkalemia improving -Nephrology following, no indications for dialysis yet, monitor urine output and kidney function daily -Urology following as well -Add [place] in a.m.  Normocytic anemia Hx Metastatic Bladder Cancer - 3 week history of intermittent hemoptysis and melena - Hgb now 7.8, check anemia panel - Continue PPI, check VQ scan and Dopplers -No evidence of active GI bleeding at this time, monitor -FU with Oncology and Urology at [facility]  HTN - hold home Losartan for renal failure  Hypothyroidism - cont home Synthroid  Restless Legs, MDD, Chronic Pain, Insomnia - hold home Cymbalta due to kidney function - Trazodone PRN qhs - Reduce Requip to 2 mg qhs PRN due to kidney function  Code Status: DNR/DNI. Declines chest compressions and intubation. Okay with NIV, pressors, ICU-level care. Reports she has a living will in place. DVT Prophylaxis:  SCDs Family Communication: Discussed with patient in detail, no family at bedside  Disposition Plan:  Status is: Inpatient  Remains inpatient appropriate because:Inpatient level of care appropriate due to severity of illness  Dispo: The patient is from: Home              Anticipated d/c is to: Home              Anticipated d/c date is: > 3 days              Patient currently is not medically stable to d/c.  Consultants:   Renal  Urology  Interventional radiology  Procedures: 7/12 successful fluoroscopic guided exchange, repositioning and up-sizing of now 12 Fr right sided nephroureteral catheter via ostomy with end coiled in the renal pelvis.   Ulice Dash  Watts, MD  Antimicrobials:    Subjective: -Feels tired overall, ileal conduit with brisk urine output now -Hungry would like to eat something, denies overt  shortness of breath  Objective: Vitals:   10/01/19 0135 10/01/19 0245 10/01/19 0602 10/01/19 1030  BP:  134/64 123/65 133/69  Pulse:  86 83 72  Resp:  16 16 16   Temp:  98.3 F (36.8 C) 98.3 F (36.8 C) 98.2 F (36.8 C)  TempSrc:  Oral Oral Oral  SpO2: 98% 97% 94% 94%  Weight:      Height:        Intake/Output Summary (Last 24 hours) at 10/01/2019 1216 Last data filed at 10/01/2019 0900 Gross per 24 hour  Intake 90 ml  Output 1660 ml  Net -1570 ml   Filed Weights   09/30/19 1621 09/30/19 2247 09/30/19 2333  Weight: 88 kg 90.3 kg 91.9 kg    Examination:  General exam: Pleasant middle-aged female sitting up in bed, AAOx3, no distress Respiratory system: Decreased breath sounds the bases, otherwise clear Cardiovascular system: S1 & S2 heard, RRR.   Gastrointestinal system: Abdomen is nondistended, soft and nontender.ileal conduit with dark red urine, normal bowel sounds heard. Central nervous system: Alert and oriented. No focal neurological deficits. Extremities: No edema Skin: No rashes on exposed skin Psychiatry: Mood & affect appropriate.     Data Reviewed:   CBC: Recent Labs  Lab 09/30/19 1629 09/30/19 1831 10/01/19 0554  WBC 11.0*  --  10.1  HGB 8.4* 8.8* 7.8*  HCT 27.7* 26.0* 25.6*  MCV 85.5  --  85.9  PLT 580*  --  099*   Basic Metabolic Panel: Recent Labs  Lab 09/30/19 1629 09/30/19 1831 09/30/19 2319 10/01/19 0219 10/01/19 0554  NA 134* 134* 138 138 138  K 7.3* 7.0* 6.4* 5.8* 5.8*  CL 103 104 105 105 104  CO2 16*  --  18* 19* 19*  GLUCOSE 142* 93 103* 160* 145*  BUN 115* 122* 113* 112* 111*  CREATININE 15.02* 17.20* 15.13* 14.56* 14.66*  CALCIUM 9.3  --  9.0 9.5 9.6  MG  --   --  2.2  --   --    GFR: Estimated Creatinine Clearance: 4.2 mL/min (A) (by C-G formula based on SCr of 14.66 mg/dL (H)). Liver Function Tests: Recent Labs  Lab 09/30/19 2319  AST 6*  ALT 10  ALKPHOS 73  BILITOT 0.8  PROT 5.9*  ALBUMIN 2.8*   No results  for input(s): LIPASE, AMYLASE in the last 168 hours. No results for input(s): AMMONIA in the last 168 hours. Coagulation Profile: Recent Labs  Lab 09/30/19 2111  INR 1.2   Cardiac Enzymes: No results for input(s): CKTOTAL, CKMB, CKMBINDEX, TROPONINI in the last 168 hours. BNP (last 3 results) No results for input(s): PROBNP in the last 8760 hours. HbA1C: No results for input(s): HGBA1C in the last 72 hours. CBG: Recent Labs  Lab 09/30/19 2109 10/01/19 0025  GLUCAP 82 84   Lipid Profile: No results for input(s): CHOL, HDL, LDLCALC, TRIG, CHOLHDL, LDLDIRECT in the last 72 hours. Thyroid Function Tests: No results for input(s): TSH, T4TOTAL, FREET4, T3FREE, THYROIDAB in the last 72 hours. Anemia Panel: No results for input(s): VITAMINB12, FOLATE, FERRITIN, TIBC, IRON, RETICCTPCT in the last 72 hours. Urine analysis:    Component Value Date/Time   COLORURINE YELLOW 09/30/2019 1800   APPEARANCEUR TURBID (A) 09/30/2019 1800   LABSPEC 1.006 09/30/2019 1800   PHURINE 5.0 09/30/2019 1800   GLUCOSEU NEGATIVE 09/30/2019  1800   HGBUR LARGE (A) 09/30/2019 1800   BILIRUBINUR NEGATIVE 09/30/2019 1800   KETONESUR NEGATIVE 09/30/2019 1800   PROTEINUR 100 (A) 09/30/2019 1800   NITRITE NEGATIVE 09/30/2019 1800   LEUKOCYTESUR LARGE (A) 09/30/2019 1800   Sepsis Labs: @LABRCNTIP (procalcitonin:4,lacticidven:4)  ) Recent Results (from the past 240 hour(s))  SARS Coronavirus 2 by RT PCR (hospital order, performed in [facility] hospital lab) Nasopharyngeal Nasopharyngeal Swab     Status: None   Collection Time: 09/30/19  8:02 PM   Specimen: Nasopharyngeal Swab  Result Value Ref Range Status   SARS Coronavirus 2 NEGATIVE NEGATIVE Final    Comment: (NOTE) SARS-CoV-2 target nucleic acids are NOT DETECTED.  The SARS-CoV-2 RNA is generally detectable in upper and lower respiratory specimens during the acute phase of infection. The lowest concentration of SARS-CoV-2 viral copies this assay  can detect is 250 copies / mL. A negative result does not preclude SARS-CoV-2 infection and should not be used as the sole basis for treatment or other patient management decisions.  A negative result may occur with improper specimen collection / handling, submission of specimen other than nasopharyngeal swab, presence of viral mutation(s) within the areas targeted by this assay, and inadequate number of viral copies (<250 copies / mL). A negative result must be combined with clinical observations, patient history, and epidemiological information.  Fact Sheet for Patients:   StrictlyIdeas.no  Fact Sheet for Healthcare Providers: BankingDealers.co.za  This test is not yet approved or  cleared by the [place] FDA and has been authorized for detection and/or diagnosis of SARS-CoV-2 by FDA under an Emergency Use Authorization (EUA).  This EUA will remain in effect (meaning this test can be used) for the duration of the COVID-19 declaration under Section 564(b)(1) of the Act, 21 U.S.C. section 360bbb-3(b)(1), unless the authorization is terminated or revoked sooner.  Performed at [facility], [place] [address] [place], [place] [address]          Radiology Studies: CT Abdomen Pelvis Wo Contrast  Result Date: 09/30/2019 CLINICAL DATA:  Generalized weakness, abdominal pain, hemoptysis, history of bladder cancer EXAM: CT ABDOMEN AND PELVIS WITHOUT CONTRAST TECHNIQUE: Multidetector CT imaging of the abdomen and pelvis was performed following the standard protocol without IV contrast. COMPARISON:  04/08/2017 FINDINGS: Lower chest: No acute pleural or parenchymal lung disease. Hepatobiliary: No focal liver abnormality is seen. Status post cholecystectomy. No biliary dilatation. Pancreas: Unremarkable. No pancreatic ductal dilatation or surrounding inflammatory changes. Spleen: Normal in size without focal abnormality. Adrenals/Urinary  Tract: Postsurgical changes are seen from left nephro ureterectomy and cystectomy. There is significant distension of the right renal collecting system and right ureter, which may be related to diverting urostomy. There is a drainage catheter coiled within the urinary diversion right lower quadrant. Stomach/Bowel: No bowel obstruction or ileus. Diverticulosis of the descending and sigmoid colon without diverticulitis. No bowel wall thickening or inflammatory change. Vascular/Lymphatic: Aortic atherosclerosis. No enlarged abdominal or pelvic lymph nodes. Reproductive: Status post hysterectomy. No adnexal masses. Other: No free fluid or free gas. Small fat containing left inguinal hernia unchanged. Musculoskeletal: No acute or destructive bony lesions. Reconstructed images demonstrate no additional findings. IMPRESSION: 1. Obstructive uropathy within the right kidney and right ureter, of uncertain etiology. This could be due, in part, to the urinary diversion in the right lower quadrant. 2. Postsurgical changes from a left nephro ureterectomy and cystectomy. 3. Diverticulosis without diverticulitis. Electronically Signed   By: Randa Ngo M.D.   On: 09/30/2019 18:50  DG Chest Port 1 View  Result Date: 09/30/2019 CLINICAL DATA:  Hemoptysis EXAM: PORTABLE CHEST 1 VIEW COMPARISON:  None. FINDINGS: There is mild cardiomegaly. Both lungs are clear. No large airspace consolidation or pleural effusion. The visualized skeletal structures are unremarkable. IMPRESSION: No acute cardiopulmonary process Electronically Signed   By: Prudencio Pair M.D.   On: 09/30/2019 18:20   IR EXT NEPHROURETERAL CATH EXCHANGE  Result Date: 10/01/2019 INDICATION: History of bladder cancer post cystectomy and left-sided nephro ureterectomy now with ileal conduit creation and right-sided retrograde ureteral stent placement (all of which was performed at an outside institution), now presenting with renal failure. Preceding noncontrast CT  scan abdomen and pelvis demonstrates retraction of the right-sided retrograde ureteral catheter to the level of the distal ureteral-enteric anastomosis. As such, patient presents now for fluoroscopic guided ureteral stent exchange/repositioning and/or nephrostomy catheter placement. EXAM: FLUOROSCOPIC GUIDED RIGHT SIDED NEPHROURETERAL CATHETER EXCHANGE COMPARISON:  CT abdomen pelvis-earlier same day CONTRAST:  15 mL Isovue-300 administered into the collecting system FLUOROSCOPY TIME:  4 minutes, 24 seconds (93.7 mGy) COMPLICATIONS: None immediate. TECHNIQUE: Informed written consent was obtained from the patient after a discussion of the risks, benefits and alternatives to treatment. Questions regarding the procedure were encouraged and answered. A timeout was performed prior to the initiation of the procedure. External portion of the existing retrograde nephroureteral catheter as well as the surrounding skin was draped in the usual sterile fashion. A sterile drape was applied covering the operative field. Maximum barrier sterile technique with sterile gowns and gloves were used for the procedure. A timeout was performed prior to the initiation of the procedure. A pre procedural spot fluoroscopic image was obtained after contrast was injected via the existing 8 French nephroureteral catheter confirming malpositioning of the nephroureteral catheter located at the level of the distal ureteral enteric anastomosis with faint opacification of the distal aspect the right ureter. The external portion of the catheter was cut, however despite prolonged efforts, the wire could not be advanced through the entirety of the catheter due to the catheter's incrustation. As such, the nephroureteral catheter was removed and with the use of a regular glidewire, a Kumpe catheter was advanced to the level of the mid aspect of the right ureter. Contrast injection confirmed appropriate positioning Over a Amplatz wire, a new, slightly  larger now 12 French, 45 cm all-purpose drainage catheter was placed with distal end ultimately coiled and locked within the right renal pelvis. Contrast injection confirmed appropriate positioning within the renal pelvis and a post exchange fluoroscopic image was obtained. The catheter was locked and the patient's urostomy was reapplied. The patient tolerated the procedure well without immediate postprocedural complication. FINDINGS: The existing nephroureteral catheter is malpositioned located at the level of the distal ureteral anastomosis. Additionally, the nephroureteral catheter is nearly completely occluded with encrusted debris. After successful fluoroscopic guided exchange, the new, slightly larger now 12 French nephroureteral catheter is appropriately positioned with end coiled and locked within the right renal pelvis. IMPRESSION: Successful fluoroscopic guided exchange, repositioning and up sizing of now 12 French, 45 cm right-sided nephroureteral catheter. Electronically Signed   By: Sandi Mariscal M.D.   On: 10/01/2019 08:08        Scheduled Meds: . cycloSPORINE  1 drop Both Eyes BID  . levothyroxine  100 mcg Oral Q0600  . pantoprazole (PROTONIX) IV  40 mg Intravenous Q12H   Continuous Infusions:   LOS: 1 day    Time spent: 76min  Domenic Polite, MD [facility]  10/01/2019, 12:16 PM

## 2019-10-01 NOTE — Progress Notes (Signed)
New Admission Note:   Arrival Method: via Stretcher from IR Mental Orientation: Alert & Oriented x4 Telemetry: [phone], CCMD notified Assessment: to be completed Skin: Intact, warm and dry IV: RAC, saline locked Pain: 0/10 Tubes: nephroureteral catheter via ostomy RLQ Safety Measures: Safety Fall Prevention Plan has been discussed  Admission: to be completed 5 [place] Orientation: Patient has been orientated to the room, unit and staff.   Family: none at bedside   Orders to be reviewed and implemented. Will continue to monitor the patient. Call light has been placed within reach and bed alarm has been activated.

## 2019-10-01 NOTE — Progress Notes (Addendum)
Occupational Therapy Evaluation Patient Details Name: Shelia Cruz MRN: [id number] DOB: [birth year] Today's Date: 10/01/2019    History of Present Illness Shelia Cruz is a 69 y.o. female with PMH of metastatic bladder cancer s/p left nephroureterectomy and radical cystectomy with ileal conduit and recent stent placement for ureteral obstruction as well as HTN and hypothyroidism presented to ED with weakness and hemoptysis and admitted for acute renal failure, hyperkalemia and hydroureteronephrosis.   Clinical Impression   PTA, pt lives alone and reports Modified Independence with ADLs, IADLs and driving. Pt reports using quad cane for mobility outside of the home. Pt presents now with diagnoses above and deficits in dynamic standing balance (hx of falls) and decreased activity tolerance impacting ability to safely complete daily tasks. Pt overall min guard for ADLs and Min A for steps to recliner chair due to deficits mentioned above. Pt receptive of education and recommendations for fall prevention, use of DME, and energy conservation strategies. Plan to assess safety with trial of tub/bench transfer and work on dynamic standing balance during ADLs to maximize independence.    Follow Up Recommendations  SNF (HHOT if 24/7 supervision available)    Equipment Recommendations  Tub/shower bench;Other (comment);3 in 1 bedside commode (RW)    Recommendations for Other Services       Precautions / Restrictions Precautions Precautions: Fall Precaution Comments: urostomy  Restrictions Weight Bearing Restrictions: No      Mobility Bed Mobility Overal bed mobility: Modified Independent             General bed mobility comments: Modified Independent with increased time  Transfers Overall transfer level: Needs assistance Equipment used: Quad cane Transfers: Sit to/from [employer] to Stand: Min guard Stand pivot transfers: Min assist       General  transfer comment: min guard for steadying in standing with quad cane    Balance Overall balance assessment: Needs assistance Sitting-balance support: No upper extremity supported;Feet supported Sitting balance-Leahy Scale: Fair     Standing balance support: Single extremity supported Standing balance-Leahy Scale: Poor Standing balance comment: requires light support from therapist for dynamic standing balance using quad cane, improved with static standing                            ADL either performed or assessed with clinical judgement   ADL Overall ADL's : Needs assistance/impaired Eating/Feeding: Independent;Sitting   Grooming: Min guard;Standing   Upper Body Bathing: Set up;Sitting   Lower Body Bathing: Min guard;Sit to/from stand   Upper Body Dressing : Set up;Sitting   Lower Body Dressing: Min guard;Sit to/from stand Lower Body Dressing Details (indicate cue type and reason): Pt demonstrated ability to reach B feet to pull up socks without assistance. Pt would require min guard for LB dressing tasks in standing due to unsteadiness on feet Toilet Transfer: Min Designer, jewellery Details (indicate cue type and reason): Min guard required to ensure safety with turning due to unsteadiness. simulated with transfer to recliner chair with quad cane Toileting- Clothing Manipulation and Hygiene: Min guard;Sit to/from stand Toileting - Clothing Manipulation Details (indicate cue type and reason): min guard for emptying urostomy bag in standing. min guard for steadying during bimanual task     Functional mobility during ADLs: Cane;Minimal assistance General ADL Comments: Pt min guard at most for ADLs/mobility assessed with deficits noted in static/dynamic standing balance, as well as decreased activity tolerance.      Vision Baseline  Vision/History: Wears glasses Wears Glasses: At all times Patient Visual Report: No change from baseline Vision  Assessment?: No apparent visual deficits     Perception     Praxis      Pertinent Vitals/Pain Pain Assessment: No/denies pain     Hand Dominance Right   Extremity/Trunk Assessment Upper Extremity Assessment Upper Extremity Assessment: Overall WFL for tasks assessed   Lower Extremity Assessment Lower Extremity Assessment: Defer to PT evaluation   Cervical / Trunk Assessment Cervical / Trunk Assessment: Normal   Communication Communication Communication: No difficulties   Cognition Arousal/Alertness: Awake/alert Behavior During Therapy: WFL for tasks assessed/performed Overall Cognitive Status: Within Functional Limits for tasks assessed                                 General Comments: A&Ox4. Pt active participant in DC safety conversations. Aware of deficits    General Comments  Pt reporting difficulty and hx of falls with tub transfers. Educated on use of tub bench to increase safety and promote use of energy conservation strategies to maximize safety in the home with pt receptive to education. Pt also agreeable to use RW indoors on days when feeling weaker to decrease fall risk    Exercises     Shoulder Instructions      Home Living Family/patient expects to be discharged to:: Private residence Living Arrangements: Alone Available Help at Discharge: Family;Available PRN/intermittently (daughter lives in [place]) Type of Home: Apartment Home Access: Stairs to enter [employer] of Steps: 4-5 Entrance Stairs-Rails: Right;Left Home Layout: One level     Bathroom Shower/Tub: Teacher, early years/pre: Standard     Home Equipment: Cane - quad;Shower seat          Prior Functioning/Environment Level of Independence: Independent with assistive device(s)        Comments: Pt reports Modified Independence/Independence with ADLs, IADLs in the home and mobility. Pt was still driving. Pt reports 7 falls in the past year         OT Problem List: Decreased activity tolerance;Impaired balance (sitting and/or standing);Decreased knowledge of use of DME or AE      OT Treatment/Interventions: Self-care/ADL training;Therapeutic exercise;Energy conservation;DME and/or AE instruction;Therapeutic activities;Patient/family education    OT Goals(Current goals can be found in the care plan section) Acute Rehab OT Goals Patient Stated Goal: work on balance, be as independent as possible OT Goal Formulation: With patient Time For Goal Achievement: 10/15/19 Potential to Achieve Goals: Good ADL Goals Pt Will Perform Grooming: with modified independence;standing Pt Will Perform Lower Body Bathing: with modified independence;sit to/from stand Pt Will Perform Lower Body Dressing: with modified independence;sit to/from stand Pt Will Transfer to Toilet: with modified independence;ambulating;regular height toilet Pt Will Perform Toileting - Clothing Manipulation and hygiene: with modified independence;sit to/from stand Pt Will Perform Tub/Shower Transfer: Tub transfer;with supervision;tub bench;ambulating Additional ADL Goal #1: Pt to verbalize at least 3 fall prevention strategies to maximize safety during daily tasks at home  OT Frequency: Min 2X/week   Barriers to D/C:            Co-evaluation              AM-PAC OT "6 Clicks" Daily Activity     Outcome Measure Help from another person eating meals?: None Help from another person taking care of personal grooming?: A Little Help from another person toileting, which includes using toliet, bedpan, or urinal?: A  Little Help from another person bathing (including washing, rinsing, drying)?: A Little Help from another person to put on and taking off regular upper body clothing?: A Little Help from another person to put on and taking off regular lower body clothing?: A Little 6 Click Score: 19   End of Session Equipment Utilized During Treatment:  (quad cane) Nurse  Communication: Mobility status  Activity Tolerance: Patient tolerated treatment well Patient left: in chair;with call bell/phone within reach  OT Visit Diagnosis: Unsteadiness on feet (R26.81);Other abnormalities of gait and mobility (R26.89);Repeated falls (R29.6);History of falling (Z91.81)                Time: 8184-0375 OT Time Calculation (min): 26 min Charges:  OT General Charges $OT Visit: 1 Visit OT Evaluation $OT Eval Moderate Complexity: 1 Mod OT Treatments $Therapeutic Activity: 8-22 mins  Layla Maw, OTR/L  Layla Maw 10/01/2019, 10:38 AM

## 2019-10-01 NOTE — Progress Notes (Signed)
Urology Consult Follow up Note  Physician requesting consult: Domenic Polite, MD  Reason for consult: Hydronephrosis, renal failure  Subjective: Patient feels much improved this morning. Underwent stent replacement last night with VIR. Reports less pain and significant increase in urine output  Physical Exam:  Vital signs in last 24 hours: Temp:  [98.2 F (36.8 C)-98.5 F (36.9 C)] 98.3 F (36.8 C) (07/13 0602) Pulse Rate:  [63-86] 83 (07/13 0602) Resp:  [13-20] 16 (07/13 0602) BP: (123-144)/(64-87) 123/65 (07/13 0602) SpO2:  [94 %-100 %] 94 % (07/13 0602) Weight:  [88 kg-91.9 kg] 91.9 kg (07/12 2333) Constitutional:  Alert and oriented, No acute distress Cardiovascular: Regular rate and rhythm, No JVD Respiratory: Normal respiratory effort Abdomen: Soft, nontender; 12Fr nephroureteral stent emanting from ostomy GU: Right CVA tenderness Lymphatic: No lymphadenopathy Neurologic: Grossly intact, no focal deficits Psychiatric: Normal mood and affect  Laboratory Data:  Recent Labs    09/30/19 1629 09/30/19 1831 10/01/19 0554  WBC 11.0*  --  10.1  HGB 8.4* 8.8* 7.8*  HCT 27.7* 26.0* 25.6*  PLT 580*  --  500*    Recent Labs    09/30/19 1629 09/30/19 1831 09/30/19 2319 10/01/19 0219 10/01/19 0554  NA 134* 134* 138 138 138  K 7.3* 7.0* 6.4* 5.8* 5.8*  CL 103 104 105 105 104  GLUCOSE 142* 93 103* 160* 145*  BUN 115* 122* 113* 112* 111*  CALCIUM 9.3  --  9.0 9.5 9.6  CREATININE 15.02* 17.20* 15.13* 14.56* 14.66*     Results for orders placed or performed during the hospital encounter of 09/30/19 (from the past 24 hour(s))  Basic metabolic panel     Status: Abnormal   Collection Time: 09/30/19  4:29 PM  Result Value Ref Range   Sodium 134 (L) 135 - 145 mmol/L   Potassium 7.3 (HH) 3.5 - 5.1 mmol/L   Chloride 103 98 - 111 mmol/L   CO2 16 (L) 22 - 32 mmol/L   Glucose, Bld 142 (H) 70 - 99 mg/dL   BUN 115 (H) 8 - 23 mg/dL   Creatinine, Ser 15.02 (H) 0.44 - 1.00  mg/dL   Calcium 9.3 8.9 - 10.3 mg/dL   GFR calc non Af Amer 2 (L) >60 mL/min   GFR calc Af Amer 3 (L) >60 mL/min   Anion gap 15 5 - 15  CBC     Status: Abnormal   Collection Time: 09/30/19  4:29 PM  Result Value Ref Range   WBC 11.0 (H) 4.0 - 10.5 K/uL   RBC 3.24 (L) 3.87 - 5.11 MIL/uL   Hemoglobin 8.4 (L) 12.0 - 15.0 g/dL   HCT 27.7 (L) 36 - 46 %   MCV 85.5 80.0 - 100.0 fL   MCH 25.9 (L) 26.0 - 34.0 pg   MCHC 30.3 30.0 - 36.0 g/dL   RDW 15.7 (H) 11.5 - 15.5 %   Platelets 580 (H) 150 - 400 K/uL   nRBC 0.0 0.0 - 0.2 %  Urinalysis, Routine w reflex microscopic     Status: Abnormal   Collection Time: 09/30/19  6:00 PM  Result Value Ref Range   Color, Urine YELLOW YELLOW   APPearance TURBID (A) CLEAR   Specific Gravity, Urine 1.006 1.005 - 1.030   pH 5.0 5.0 - 8.0   Glucose, UA NEGATIVE NEGATIVE mg/dL   Hgb urine dipstick LARGE (A) NEGATIVE   Bilirubin Urine NEGATIVE NEGATIVE   Ketones, ur NEGATIVE NEGATIVE mg/dL   Protein, ur 100 (A) NEGATIVE  mg/dL   Nitrite NEGATIVE NEGATIVE   Leukocytes,Ua LARGE (A) NEGATIVE   RBC / HPF 21-50 0 - 5 RBC/hpf   WBC, UA >50 (H) 0 - 5 WBC/hpf   Bacteria, UA MANY (A) NONE SEEN   WBC Clumps PRESENT   I-stat chem 8, ED (not at [facility] or [facility])     Status: Abnormal   Collection Time: 09/30/19  6:31 PM  Result Value Ref Range   Sodium 134 (L) 135 - 145 mmol/L   Potassium 7.0 (HH) 3.5 - 5.1 mmol/L   Chloride 104 98 - 111 mmol/L   BUN 122 (H) 8 - 23 mg/dL   Creatinine, Ser 17.20 (H) 0.44 - 1.00 mg/dL   Glucose, Bld 93 70 - 99 mg/dL   Calcium, Ion 1.27 1.15 - 1.40 mmol/L   TCO2 19 (L) 22 - 32 mmol/L   Hemoglobin 8.8 (L) 12.0 - 15.0 g/dL   HCT 26.0 (L) 36 - 46 %  SARS Coronavirus 2 by RT PCR (hospital order, performed in [place] hospital lab) Nasopharyngeal Nasopharyngeal Swab     Status: None   Collection Time: 09/30/19  8:02 PM   Specimen: Nasopharyngeal Swab  Result Value Ref Range   SARS Coronavirus 2 NEGATIVE NEGATIVE  CBG monitoring, ED      Status: None   Collection Time: 09/30/19  9:09 PM  Result Value Ref Range   Glucose-Capillary 82 70 - 99 mg/dL  Protime-INR     Status: None   Collection Time: 09/30/19  9:11 PM  Result Value Ref Range   Prothrombin Time 14.4 11.4 - 15.2 seconds   INR 1.2 0.8 - 1.2  Type and screen [place]     Status: None   Collection Time: 09/30/19  9:30 PM  Result Value Ref Range   ABO/RH(D) A NEG    Antibody Screen NEG    Sample Expiration      10/03/2019,2359 Performed at [facility], [address] [place], [place] [address]   ABO/Rh     Status: None   Collection Time: 09/30/19 10:10 PM  Result Value Ref Range   ABO/RH(D)      A NEG Performed at [place] [address] [place], [place] [address]   Basic metabolic panel     Status: Abnormal   Collection Time: 09/30/19 11:19 PM  Result Value Ref Range   Sodium 138 135 - 145 mmol/L   Potassium 6.4 (HH) 3.5 - 5.1 mmol/L   Chloride 105 98 - 111 mmol/L   CO2 18 (L) 22 - 32 mmol/L   Glucose, Bld 103 (H) 70 - 99 mg/dL   BUN 113 (H) 8 - 23 mg/dL   Creatinine, Ser 15.13 (H) 0.44 - 1.00 mg/dL   Calcium 9.0 8.9 - 10.3 mg/dL   GFR calc non Af Amer 2 (L) >60 mL/min   GFR calc Af Amer 3 (L) >60 mL/min   Anion gap 15 5 - 15  Magnesium     Status: None   Collection Time: 09/30/19 11:19 PM  Result Value Ref Range   Magnesium 2.2 1.7 - 2.4 mg/dL  Hepatic function panel     Status: Abnormal   Collection Time: 09/30/19 11:19 PM  Result Value Ref Range   Total Protein 5.9 (L) 6.5 - 8.1 g/dL   Albumin 2.8 (L) 3.5 - 5.0 g/dL   AST 6 (L) 15 - 41 U/L   ALT 10 0 - 44 U/L  Alkaline Phosphatase 73 38 - 126 U/L   Total Bilirubin 0.8 0.3 - 1.2 mg/dL   Bilirubin, Direct <0.1 0.0 - 0.2 mg/dL   Indirect Bilirubin NOT CALCULATED 0.3 - 0.9 mg/dL  HIV Antibody (routine testing w rflx)     Status: None   Collection Time: 09/30/19 11:19 PM  Result Value Ref Range   HIV Screen 4th Generation wRfx Non Reactive Non  Reactive  Glucose, capillary     Status: None   Collection Time: 10/01/19 12:25 AM  Result Value Ref Range   Glucose-Capillary 84 70 - 99 mg/dL  Basic metabolic panel     Status: Abnormal   Collection Time: 10/01/19  2:19 AM  Result Value Ref Range   Sodium 138 135 - 145 mmol/L   Potassium 5.8 (H) 3.5 - 5.1 mmol/L   Chloride 105 98 - 111 mmol/L   CO2 19 (L) 22 - 32 mmol/L   Glucose, Bld 160 (H) 70 - 99 mg/dL   BUN 112 (H) 8 - 23 mg/dL   Creatinine, Ser 14.56 (H) 0.44 - 1.00 mg/dL   Calcium 9.5 8.9 - 10.3 mg/dL   GFR calc non Af Amer 2 (L) >60 mL/min   GFR calc Af Amer 3 (L) >60 mL/min   Anion gap 14 5 - 15  Basic metabolic panel     Status: Abnormal   Collection Time: 10/01/19  5:54 AM  Result Value Ref Range   Sodium 138 135 - 145 mmol/L   Potassium 5.8 (H) 3.5 - 5.1 mmol/L   Chloride 104 98 - 111 mmol/L   CO2 19 (L) 22 - 32 mmol/L   Glucose, Bld 145 (H) 70 - 99 mg/dL   BUN 111 (H) 8 - 23 mg/dL   Creatinine, Ser 14.66 (H) 0.44 - 1.00 mg/dL   Calcium 9.6 8.9 - 10.3 mg/dL   GFR calc non Af Amer 2 (L) >60 mL/min   GFR calc Af Amer 3 (L) >60 mL/min   Anion gap 15 5 - 15  CBC     Status: Abnormal   Collection Time: 10/01/19  5:54 AM  Result Value Ref Range   WBC 10.1 4.0 - 10.5 K/uL   RBC 2.98 (L) 3.87 - 5.11 MIL/uL   Hemoglobin 7.8 (L) 12.0 - 15.0 g/dL   HCT 25.6 (L) 36 - 46 %   MCV 85.9 80.0 - 100.0 fL   MCH 26.2 26.0 - 34.0 pg   MCHC 30.5 30.0 - 36.0 g/dL   RDW 15.8 (H) 11.5 - 15.5 %   Platelets 500 (H) 150 - 400 K/uL   nRBC 0.0 0.0 - 0.2 %   Recent Results (from the past 240 hour(s))  SARS Coronavirus 2 by RT PCR (hospital order, performed in [facility] hospital lab) Nasopharyngeal Nasopharyngeal Swab     Status: None   Collection Time: 09/30/19  8:02 PM   Specimen: Nasopharyngeal Swab  Result Value Ref Range Status   SARS Coronavirus 2 NEGATIVE NEGATIVE Final    Comment: (NOTE) SARS-CoV-2 target nucleic acids are NOT DETECTED.  The SARS-CoV-2 RNA is generally  detectable in upper and lower respiratory specimens during the acute phase of infection. The lowest concentration of SARS-CoV-2 viral copies this assay can detect is 250 copies / mL. A negative result does not preclude SARS-CoV-2 infection and should not be used as the sole basis for treatment or other patient management decisions.  A negative result may occur with improper specimen collection / handling, submission of specimen  other than nasopharyngeal swab, presence of viral mutation(s) within the areas targeted by this assay, and inadequate number of viral copies (<250 copies / mL). A negative result must be combined with clinical observations, patient history, and epidemiological information.  Fact Sheet for Patients:   StrictlyIdeas.no  Fact Sheet for Healthcare Providers: BankingDealers.co.za  This test is not yet approved or  cleared by the [place] FDA and has been authorized for detection and/or diagnosis of SARS-CoV-2 by FDA under an Emergency Use Authorization (EUA).  This EUA will remain in effect (meaning this test can be used) for the duration of the COVID-19 declaration under Section 564(b)(1) of the Act, 21 U.S.C. section 360bbb-3(b)(1), unless the authorization is terminated or revoked sooner.  Performed at [facility], [place] [address], [place], [place] [address]     Renal Function: Recent Labs    09/30/19 1629 09/30/19 1831 09/30/19 2319 10/01/19 0219 10/01/19 0554  CREATININE 15.02* 17.20* 15.13* 14.56* 14.66*   Estimated Creatinine Clearance: 4.2 mL/min (A) (by C-G formula based on SCr of 14.66 mg/dL (H)).  Radiologic Imaging: CT Abdomen Pelvis Wo Contrast  Result Date: 09/30/2019 CLINICAL DATA:  Generalized weakness, abdominal pain, hemoptysis, history of bladder cancer EXAM: CT ABDOMEN AND PELVIS WITHOUT CONTRAST TECHNIQUE: Multidetector CT imaging of the abdomen and pelvis was performed  following the standard protocol without IV contrast. COMPARISON:  04/08/2017 FINDINGS: Lower chest: No acute pleural or parenchymal lung disease. Hepatobiliary: No focal liver abnormality is seen. Status post cholecystectomy. No biliary dilatation. Pancreas: Unremarkable. No pancreatic ductal dilatation or surrounding inflammatory changes. Spleen: Normal in size without focal abnormality. Adrenals/Urinary Tract: Postsurgical changes are seen from left nephro ureterectomy and cystectomy. There is significant distension of the right renal collecting system and right ureter, which may be related to diverting urostomy. There is a drainage catheter coiled within the urinary diversion right lower quadrant. Stomach/Bowel: No bowel obstruction or ileus. Diverticulosis of the descending and sigmoid colon without diverticulitis. No bowel wall thickening or inflammatory change. Vascular/Lymphatic: Aortic atherosclerosis. No enlarged abdominal or pelvic lymph nodes. Reproductive: Status post hysterectomy. No adnexal masses. Other: No free fluid or free gas. Small fat containing left inguinal hernia unchanged. Musculoskeletal: No acute or destructive bony lesions. Reconstructed images demonstrate no additional findings. IMPRESSION: 1. Obstructive uropathy within the right kidney and right ureter, of uncertain etiology. This could be due, in part, to the urinary diversion in the right lower quadrant. 2. Postsurgical changes from a left nephro ureterectomy and cystectomy. 3. Diverticulosis without diverticulitis. Electronically Signed   By: Randa Ngo M.D.   On: 09/30/2019 18:50   DG Chest Port 1 View  Result Date: 09/30/2019 CLINICAL DATA:  Hemoptysis EXAM: PORTABLE CHEST 1 VIEW COMPARISON:  None. FINDINGS: There is mild cardiomegaly. Both lungs are clear. No large airspace consolidation or pleural effusion. The visualized skeletal structures are unremarkable. IMPRESSION: No acute cardiopulmonary process Electronically  Signed   By: Prudencio Pair M.D.   On: 09/30/2019 18:20    I independently reviewed the above imaging studies.  Impression/Recommendation: 69 y.o. female with history of left nephroureterectomy and radical cystectomy with ileal conduit, with recent stent placement for ureteral obstruction, now with acute renal failure and CT showing hydroureteronephrosis of her solitary kidney with malpositioned right stent in the ileal conduit.  Patient underwent repositiong of bander stent and upsizing to12Fr last night with VIR.  Urine output 1.4L, consistent with likely post obstructive diuresis. Creatinine slightly downtrended to 14.6.  - Continue to closely monitor  creatinine and electrolytes, given high likelihood of post obstructive diuresis - If creatinine worsens or fails to improve, would recommend repeat imaging with RUS to evaluate hydronephrosis and consider percutaneous nephrostomy tube for definitive decompression    Carmie Kanner 10/01/2019, 7:55 AM

## 2019-10-01 NOTE — Progress Notes (Signed)
[facility] Progress Note   Subjective:   Feeling improved this AM.  Up working with PT and making jokes. UOP 1420 post procedure yesterday, 211mL recorded today but more in bag.  Objective Vitals:   10/01/19 0135 10/01/19 0245 10/01/19 0602 10/01/19 1030  BP:  134/64 123/65 133/69  Pulse:  86 83 72  Resp:  16 16 16   Temp:  98.3 F (36.8 C) 98.3 F (36.8 C) 98.2 F (36.8 C)  TempSrc:  Oral Oral Oral  SpO2: 98% 97% 94% 94%  Weight:      Height:       Physical Exam General: nontoxic Lungs: normal WOB Abdomen: urostomy with clear urine in bag, catheter noted into ostomy Extremities: no edema Neuro: conversant  Additional Objective Labs: Basic Metabolic Panel: Recent Labs  Lab 09/30/19 2319 10/01/19 0219 10/01/19 0554  NA 138 138 138  K 6.4* 5.8* 5.8*  CL 105 105 104  CO2 18* 19* 19*  GLUCOSE 103* 160* 145*  BUN 113* 112* 111*  CREATININE 15.13* 14.56* 14.66*  CALCIUM 9.0 9.5 9.6   Liver Function Tests: Recent Labs  Lab 09/30/19 2319  AST 6*  ALT 10  ALKPHOS 73  BILITOT 0.8  PROT 5.9*  ALBUMIN 2.8*   No results for input(s): LIPASE, AMYLASE in the last 168 hours. CBC: Recent Labs  Lab 09/30/19 1629 09/30/19 1831 10/01/19 0554  WBC 11.0*  --  10.1  HGB 8.4* 8.8* 7.8*  HCT 27.7* 26.0* 25.6*  MCV 85.5  --  85.9  PLT 580*  --  500*   Blood Culture No results found for: SDES, SPECREQUEST, CULT, REPTSTATUS  Cardiac Enzymes: No results for input(s): CKTOTAL, CKMB, CKMBINDEX, TROPONINI in the last 168 hours. CBG: Recent Labs  Lab 09/30/19 2109 10/01/19 0025  GLUCAP 82 84   Iron Studies: No results for input(s): IRON, TIBC, TRANSFERRIN, FERRITIN in the last 72 hours. @lablastinr3 @ Studies/Results: CT Abdomen Pelvis Wo Contrast  Result Date: 09/30/2019 CLINICAL DATA:  Generalized weakness, abdominal pain, hemoptysis, history of bladder cancer EXAM: CT ABDOMEN AND PELVIS WITHOUT CONTRAST TECHNIQUE: Multidetector CT imaging of the  abdomen and pelvis was performed following the standard protocol without IV contrast. COMPARISON:  04/08/2017 FINDINGS: Lower chest: No acute pleural or parenchymal lung disease. Hepatobiliary: No focal liver abnormality is seen. Status post cholecystectomy. No biliary dilatation. Pancreas: Unremarkable. No pancreatic ductal dilatation or surrounding inflammatory changes. Spleen: Normal in size without focal abnormality. Adrenals/Urinary Tract: Postsurgical changes are seen from left nephro ureterectomy and cystectomy. There is significant distension of the right renal collecting system and right ureter, which may be related to diverting urostomy. There is a drainage catheter coiled within the urinary diversion right lower quadrant. Stomach/Bowel: No bowel obstruction or ileus. Diverticulosis of the descending and sigmoid colon without diverticulitis. No bowel wall thickening or inflammatory change. Vascular/Lymphatic: Aortic atherosclerosis. No enlarged abdominal or pelvic lymph nodes. Reproductive: Status post hysterectomy. No adnexal masses. Other: No free fluid or free gas. Small fat containing left inguinal hernia unchanged. Musculoskeletal: No acute or destructive bony lesions. Reconstructed images demonstrate no additional findings. IMPRESSION: 1. Obstructive uropathy within the right kidney and right ureter, of uncertain etiology. This could be due, in part, to the urinary diversion in the right lower quadrant. 2. Postsurgical changes from a left nephro ureterectomy and cystectomy. 3. Diverticulosis without diverticulitis. Electronically Signed   By: Randa Ngo M.D.   On: 09/30/2019 18:50   DG Chest Port 1 View  Result Date: 09/30/2019 CLINICAL  DATA:  Hemoptysis EXAM: PORTABLE CHEST 1 VIEW COMPARISON:  None. FINDINGS: There is mild cardiomegaly. Both lungs are clear. No large airspace consolidation or pleural effusion. The visualized skeletal structures are unremarkable. IMPRESSION: No acute  cardiopulmonary process Electronically Signed   By: Prudencio Pair M.D.   On: 09/30/2019 18:20   IR EXT NEPHROURETERAL CATH EXCHANGE  Result Date: 10/01/2019 INDICATION: History of bladder cancer post cystectomy and left-sided nephro ureterectomy now with ileal conduit creation and right-sided retrograde ureteral stent placement (all of which was performed at an outside institution), now presenting with renal failure. Preceding noncontrast CT scan abdomen and pelvis demonstrates retraction of the right-sided retrograde ureteral catheter to the level of the distal ureteral-enteric anastomosis. As such, patient presents now for fluoroscopic guided ureteral stent exchange/repositioning and/or nephrostomy catheter placement. EXAM: FLUOROSCOPIC GUIDED RIGHT SIDED NEPHROURETERAL CATHETER EXCHANGE COMPARISON:  CT abdomen pelvis-earlier same day CONTRAST:  15 mL Isovue-300 administered into the collecting system FLUOROSCOPY TIME:  4 minutes, 24 seconds (87.6 mGy) COMPLICATIONS: None immediate. TECHNIQUE: Informed written consent was obtained from the patient after a discussion of the risks, benefits and alternatives to treatment. Questions regarding the procedure were encouraged and answered. A timeout was performed prior to the initiation of the procedure. External portion of the existing retrograde nephroureteral catheter as well as the surrounding skin was draped in the usual sterile fashion. A sterile drape was applied covering the operative field. Maximum barrier sterile technique with sterile gowns and gloves were used for the procedure. A timeout was performed prior to the initiation of the procedure. A pre procedural spot fluoroscopic image was obtained after contrast was injected via the existing 8 French nephroureteral catheter confirming malpositioning of the nephroureteral catheter located at the level of the distal ureteral enteric anastomosis with faint opacification of the distal aspect the right ureter.  The external portion of the catheter was cut, however despite prolonged efforts, the wire could not be advanced through the entirety of the catheter due to the catheter's incrustation. As such, the nephroureteral catheter was removed and with the use of a regular glidewire, a Kumpe catheter was advanced to the level of the mid aspect of the right ureter. Contrast injection confirmed appropriate positioning Over a Amplatz wire, a new, slightly larger now 12 French, 45 cm all-purpose drainage catheter was placed with distal end ultimately coiled and locked within the right renal pelvis. Contrast injection confirmed appropriate positioning within the renal pelvis and a post exchange fluoroscopic image was obtained. The catheter was locked and the patient's urostomy was reapplied. The patient tolerated the procedure well without immediate postprocedural complication. FINDINGS: The existing nephroureteral catheter is malpositioned located at the level of the distal ureteral anastomosis. Additionally, the nephroureteral catheter is nearly completely occluded with encrusted debris. After successful fluoroscopic guided exchange, the new, slightly larger now 12 French nephroureteral catheter is appropriately positioned with end coiled and locked within the right renal pelvis. IMPRESSION: Successful fluoroscopic guided exchange, repositioning and up sizing of now 12 French, 45 cm right-sided nephroureteral catheter. Electronically Signed   By: Sandi Mariscal M.D.   On: 10/01/2019 08:08   Medications:  . cycloSPORINE  1 drop Both Eyes BID  . levothyroxine  100 mcg Oral Q0600  . pantoprazole (PROTONIX) IV  40 mg Intravenous Q12H    Assessment 43F Severe AKI and Hyperkalemia with R sided urostomy, likely worsened obstruction.  1. AKI, likely obstructive, s/p emergent IR replacement of ureteral stent and uretral catheter 2. Severe hyperkalemia, improving s/p stent  replacement and with medical management 3. Metastatic  bladder cancer s/p L nephroureterectomy and radical cystectomy with R sided ileal conduit 4. Metabolic Acidosis related to #1, improving 5. Anemia, Hb 8s 6. HTN on ARB  Plan 1. Check BMP now and if stable/improved plan next in AM 2. Cont to hold ARB 3. No indications for RRT currently - hopefully now that obstruction relieved she'll improve significantly in the next few days 4. IVF if unable to take po   Jannifer Hick MD 10/01/2019, 11:50 AM  [facility] Pager: [phone]

## 2019-10-01 NOTE — Progress Notes (Addendum)
Initial Nutrition Assessment  DOCUMENTATION CODES:   Obesity unspecified  INTERVENTION:  Nepro Shake po BID, each supplement provides 425 kcal and 19 grams protein  MVI daily  NUTRITION DIAGNOSIS:   Inadequate oral intake related to decreased appetite as evidenced by per patient/family report.    GOAL:   Patient will meet greater than or equal to 90% of their needs    MONITOR:   PO intake, Supplement acceptance, Labs, Weight trends, I & O's  REASON FOR ASSESSMENT:   Malnutrition Screening Tool    ASSESSMENT:   Pt admitted with acute renal failure, hyperkalemia and hydroureteronephrosis. PMH includes metastatic bladder Ca s/p L nephroureterectomy and radical cystectomy with ileal conduit and recent stent placement for R ureteral obstruction, HTN on ARB. CT revealed malpositioned R stent in the ileal conduit.  7/12 - R-sided nephroureteral catheter exchanged and up-sized  Pt unavailable at time of RD visit. Discussed pt with RN.   Per H&P, pt reported decreased appetite over last several weeks.     No PO intake documented.  No significant wt loss noted in wt readings.   UOP: 1,488ml x24 hours I/O: -1,554m since admit  Labs: K+ 5.8 (H) Medications reviewed.  NUTRITION - FOCUSED PHYSICAL EXAM:  Unable to perform at this time, will attempt at follow-up.   Diet Order:   Diet Order            Diet renal with fluid restriction Fluid restriction: 1500 mL Fluid; Room service appropriate? Yes; Fluid consistency: Thin  Diet effective now                 EDUCATION NEEDS:   No education needs have been identified at this time  Skin:  Skin Assessment: Reviewed RN Assessment  Last BM:  7/12  Height:   Ht Readings from Last 1 Encounters:  09/30/19 5\' 6"  (1.676 m)    Weight:   Wt Readings from Last 1 Encounters:  09/30/19 91.9 kg    BMI:  Body mass index is 32.7 kg/m.  Estimated Nutritional Needs:   Kcal:  1900-2100  Protein:  95-110  grams  Fluid:  1567ml (per MD)    Larkin Ina, MS, RD, LDN RD pager number and weekend/on-call pager number located in [place].

## 2019-10-01 NOTE — Progress Notes (Signed)
Nephrology quick note --> follow up labs showing K 6 still.  She's putting out good urine now.   Lokelma 10g TID 1st dose now NS 0.5L bolus (last TTE with normal EF) Lasix 100mg  IV Repeat K 8pm

## 2019-10-01 NOTE — Evaluation (Signed)
Physical Therapy Evaluation Patient Details Name: Shelia Cruz MRN: [id number] DOB: [birth year] Today's Date: 10/01/2019   History of Present Illness  Shelia Cruz is a 69 y.o. female with PMH of metastatic bladder cancer s/p left nephroureterectomy and radical cystectomy with ileal conduit and recent stent placement for ureteral obstruction as well as HTN and hypothyroidism presented to ED with weakness and hemoptysis and admitted for acute renal failure, hyperkalemia and hydroureteronephrosis.  Clinical Impression  Pt is home alone, but has demonstrated generally low endurance and some weakness that will create struggles to safely get through her needs and personal care.  Given that her daughter is out of town, would expect rehab stay to be the most effective way to recover her strength, balance and safety with gait.  Pt is motivated to get home, should be able to make progress with managing stairs and reducing the need for AD.  Given her fall history, also recommend that she work toward better strategies to maintain balance with her AD.  Work acutely with her on stairs, strengthening and control of dynamic tasks.    Follow Up Recommendations SNF    Equipment Recommendations  Rolling walker with 5" wheels    Recommendations for Other Services       Precautions / Restrictions Precautions Precautions: Fall Precaution Comments: urostomy  Restrictions Weight Bearing Restrictions: No      Mobility  Bed Mobility               General bed mobility comments: up in chair when PT arrived  Transfers Overall transfer level: Needs assistance Equipment used: Rolling walker (2 wheeled);1 person hand held assist Transfers: Sit to/from [facility] Sit to Stand: Min guard Stand pivot transfers: Min guard       General transfer comment: min guard to steady but verbally cued too  Ambulation/Gait Ambulation/Gait assistance: Min guard Gait Distance (Feet): 60  Feet Assistive device: Rolling walker (2 wheeled);1 person hand held assist Gait Pattern/deviations: Step-through pattern;Decreased stride length;Wide base of support Gait velocity: reduced Gait velocity interpretation: <1.31 ft/sec, indicative of household ambulator General Gait Details: walked in her room with turning and maneuvering  Stairs            Wheelchair Mobility    Modified Rankin (Stroke Patients Only)       Balance Overall balance assessment: Needs assistance Sitting-balance support: Feet supported Sitting balance-Leahy Scale: Fair     Standing balance support: Bilateral upper extremity supported;During functional activity Standing balance-Leahy Scale: Poor Standing balance comment: needs the walker for control of balance                             Pertinent Vitals/Pain Pain Assessment: No/denies pain    Home Living Family/patient expects to be discharged to:: Private residence Living Arrangements: Alone Available Help at Discharge: Family;Available PRN/intermittently Type of Home: Apartment Home Access: Stairs to enter Entrance Stairs-Rails: Right;Left Entrance Stairs-Number of Steps: 4-5 Home Layout: One level Home Equipment: Cane - quad;Shower seat Additional Comments: daughter in [place] who is down to help at times    Prior Function Level of Independence: Independent with assistive device(s)         Comments: has been falling, but is driving and has been able to care for herself     Hand Dominance   Dominant Hand: Right    Extremity/Trunk Assessment   Upper Extremity Assessment Upper Extremity Assessment: Overall WFL for tasks assessed  Lower Extremity Assessment Lower Extremity Assessment: Generalized weakness    Cervical / Trunk Assessment Cervical / Trunk Assessment: Normal  Communication   Communication: No difficulties  Cognition Arousal/Alertness: Awake/alert Behavior During Therapy: WFL for tasks  assessed/performed Overall Cognitive Status: Within Functional Limits for tasks assessed                                        General Comments General comments (skin integrity, edema, etc.): Pt was able to maneuver in her room but talked with her about having to be home with no assistance for her home and meals.  [name] daughter is able to come over occas but cannot stay with her    Exercises     Assessment/Plan    PT Assessment Patient needs continued PT services  PT Problem List Decreased strength;Decreased range of motion;Decreased activity tolerance;Decreased balance;Decreased mobility;Decreased coordination;Decreased knowledge of use of DME       PT Treatment Interventions DME instruction;Gait training;Stair training;Functional mobility training;Therapeutic activities;Therapeutic exercise;Balance training;Neuromuscular re-education;Patient/family education    PT Goals (Current goals can be found in the Care Plan section)  Acute Rehab PT Goals Patient Stated Goal: to get stronger and safe PT Goal Formulation: With patient Time For Goal Achievement: 10/15/19 Potential to Achieve Goals: Good    Frequency Min 3X/week   Barriers to discharge Inaccessible home environment;Decreased caregiver support home with stairs and no assistance    Co-evaluation               AM-PAC PT "6 Clicks" Mobility  Outcome Measure Help needed turning from your back to your side while in a flat bed without using bedrails?: None Help needed moving from lying on your back to sitting on the side of a flat bed without using bedrails?: A Little Help needed moving to and from a bed to a chair (including a wheelchair)?: A Little Help needed standing up from a chair using your arms (e.g., wheelchair or bedside chair)?: A Little Help needed to walk in hospital room?: A Little Help needed climbing 3-5 steps with a railing? : A Lot 6 Click Score: 18    End of Session Equipment  Utilized During Treatment: Gait belt Activity Tolerance: Patient limited by fatigue;Treatment limited secondary to medical complications (Comment) Patient left: in chair;with call bell/phone within reach;with chair alarm set Nurse Communication: Mobility status PT Visit Diagnosis: Unsteadiness on feet (R26.81);Repeated falls (R29.6);Muscle weakness (generalized) (M62.81)    Time: 1120-1140 PT Time Calculation (min) (ACUTE ONLY): 20 min   Charges:   PT Evaluation $PT Eval Moderate Complexity: 1 Mod         Ramond Dial 10/01/2019, 1:57 PM  Mee Hives, PT MS Acute Rehab Dept. Number: [place]

## 2019-10-02 ENCOUNTER — Inpatient Hospital Stay (HOSPITAL_COMMUNITY): Payer: Medicare Other

## 2019-10-02 DIAGNOSIS — R609 Edema, unspecified: Secondary | ICD-10-CM

## 2019-10-02 LAB — BASIC METABOLIC PANEL
Anion gap: 16 — ABNORMAL HIGH (ref 5–15)
BUN: 105 mg/dL — ABNORMAL HIGH (ref 8–23)
CO2: 18 mmol/L — ABNORMAL LOW (ref 22–32)
Calcium: 9.2 mg/dL (ref 8.9–10.3)
Chloride: 104 mmol/L (ref 98–111)
Creatinine, Ser: 12.91 mg/dL — ABNORMAL HIGH (ref 0.44–1.00)
GFR calc Af Amer: 3 mL/min — ABNORMAL LOW (ref >60)
GFR calc non Af Amer: 3 mL/min — ABNORMAL LOW (ref >60)
Glucose, Bld: 128 mg/dL — ABNORMAL HIGH (ref 70–99)
Potassium: 4.8 mmol/L (ref 3.5–5.1)
Sodium: 138 mmol/L (ref 135–145)

## 2019-10-02 LAB — CBC
HCT: 27.9 % — ABNORMAL LOW (ref 36.0–46.0)
Hemoglobin: 8.6 g/dL — ABNORMAL LOW (ref 12.0–15.0)
MCH: 26.5 pg (ref 26.0–34.0)
MCHC: 30.8 g/dL (ref 30.0–36.0)
MCV: 86.1 fL (ref 80.0–100.0)
Platelets: 563 10*3/uL — ABNORMAL HIGH (ref 150–400)
RBC: 3.24 MIL/uL — ABNORMAL LOW (ref 3.87–5.11)
RDW: 16.2 % — ABNORMAL HIGH (ref 11.5–15.5)
WBC: 10.2 10*3/uL (ref 4.0–10.5)
nRBC: 0 % (ref 0.0–0.2)

## 2019-10-02 LAB — URINE CULTURE

## 2019-10-02 MED ORDER — SODIUM CHLORIDE 0.9 % IV SOLN
250.0000 mg | Freq: Every day | INTRAVENOUS | Status: AC
  Administered 2019-10-02 – 2019-10-03 (×2): 250 mg via INTRAVENOUS
  Filled 2019-10-02 (×2): qty 20

## 2019-10-02 NOTE — Progress Notes (Addendum)
Urology Consult Follow up Note  Physician requesting consult: Elmarie Shiley, MD  Reason for consult: Hydronephrosis, renal failure  Subjective: Patient with some right flank pain today but reports milder than prior to stent replacement. Tolerating diet  Physical Exam:  Vital signs in last 24 hours: Temp:  [98.2 F (36.8 C)-99.1 F (37.3 C)] 98.9 F (37.2 C) (07/14 0418) Pulse Rate:  [68-91] 91 (07/14 0418) Resp:  [14-18] 16 (07/14 0418) BP: (133-144)/(66-87) 144/87 (07/14 0418) SpO2:  [94 %-96 %] 95 % (07/14 0418) Constitutional:  Alert and oriented, No acute distress Respiratory: Normal respiratory effort Abdomen: Soft, nontender; 12Fr nephroureteral stent emanting from ostomy GU: Right CVA tenderness Lymphatic: No lymphadenopathy Neurologic: Grossly intact, no focal deficits Psychiatric: Normal mood and affect  Laboratory Data:  Recent Labs    09/30/19 1629 09/30/19 1831 10/01/19 0554 10/02/19 0413  WBC 11.0*  --  10.1 10.2  HGB 8.4* 8.8* 7.8* 8.6*  HCT 27.7* 26.0* 25.6* 27.9*  PLT 580*  --  500* 563*    Recent Labs    10/01/19 0219 10/01/19 0554 10/01/19 1142 10/01/19 2000 10/02/19 0413  NA 138 138 139 137 138  K 5.8* 5.8* 6.0* 5.9* 4.8  CL 105 104 104 101 104  GLUCOSE 160* 145* 140* 116* 128*  BUN 112* 111* 109* 109* 105*  CALCIUM 9.5 9.6 9.7 8.9 9.2  CREATININE 14.56* 14.66* 14.18* 13.47* 12.91*     Impression/Recommendation: 69 y.o. female with history of left nephroureterectomy and radical cystectomy with ileal conduit, with recent stent placement for ureteral obstruction, now with acute renal failure and CT showing hydroureteronephrosis of her solitary kidney with malpositioned right stent in the ileal conduit. Most recently s/p bander stent replacement with VIR on 7/12.  Creatinine continues to downtrend, 12 today. Potassium 4.8. Still with elevated urine output, 3.5L for past 24 hours. Clinically much improved.  - Recommend follow up with  Dr. Warrick Parisian with [facility] as outpatient     Carmie Kanner 10/02/2019, 8:26 AM

## 2019-10-02 NOTE — Progress Notes (Signed)
[facility] Progress Note   Subjective:   Notes nausea x about 4 weeks.  No new issues today.  I/os yest 660 / 3573mL all UOP K normal this AM  Objective Vitals:   10/01/19 1030 10/01/19 1619 10/01/19 2217 10/02/19 0418  BP: 133/69 137/66 138/71 (!) 144/87  Pulse: 72 68 87 91  Resp: 16 18 14 16   Temp: 98.2 F (36.8 C) 99 F (37.2 C) 99.1 F (37.3 C) 98.9 F (37.2 C)  TempSrc: Oral Oral Oral Oral  SpO2: 94% 95% 96% 95%  Weight:      Height:       Physical Exam General: nontoxic Lungs: normal WOB Abdomen: urostomy with clear urine in bag, catheter noted into ostomy Extremities: no edema Neuro: conversant  Additional Objective Labs: Basic Metabolic Panel: Recent Labs  Lab 10/01/19 1142 10/01/19 2000 10/02/19 0413  NA 139 137 138  K 6.0* 5.9* 4.8  CL 104 101 104  CO2 18* 20* 18*  GLUCOSE 140* 116* 128*  BUN 109* 109* 105*  CREATININE 14.18* 13.47* 12.91*  CALCIUM 9.7 8.9 9.2  PHOS 9.6* 8.4*  --    Liver Function Tests: Recent Labs  Lab 09/30/19 2319 10/01/19 1142 10/01/19 2000  AST 6*  --   --   ALT 10  --   --   ALKPHOS 73  --   --   BILITOT 0.8  --   --   PROT 5.9*  --   --   ALBUMIN 2.8* 3.0* 2.8*   No results for input(s): LIPASE, AMYLASE in the last 168 hours. CBC: Recent Labs  Lab 09/30/19 1629 09/30/19 1629 09/30/19 1831 10/01/19 0554 10/02/19 0413  WBC 11.0*  --   --  10.1 10.2  HGB 8.4*   < > 8.8* 7.8* 8.6*  HCT 27.7*   < > 26.0* 25.6* 27.9*  MCV 85.5  --   --  85.9 86.1  PLT 580*  --   --  500* 563*   < > = values in this interval not displayed.   Blood Culture    Component Value Date/Time   SDES URINE, CATHETERIZED 09/30/2019 2345   SPECREQUEST  09/30/2019 2345    NONE Performed at [facility], [place] [address] [place], [place] [address]    CULT MULTIPLE SPECIES PRESENT, SUGGEST RECOLLECTION (A) 09/30/2019 2345   REPTSTATUS 10/02/2019 FINAL 09/30/2019 2345    Cardiac Enzymes: No results for  input(s): CKTOTAL, CKMB, CKMBINDEX, TROPONINI in the last 168 hours. CBG: Recent Labs  Lab 09/30/19 2109 10/01/19 0025  GLUCAP 82 84   Iron Studies:  Recent Labs    10/01/19 1313  IRON 18*  TIBC 246*  FERRITIN 146   @lablastinr3 @ Studies/Results: CT Abdomen Pelvis Wo Contrast  Result Date: 09/30/2019 CLINICAL DATA:  Generalized weakness, abdominal pain, hemoptysis, history of bladder cancer EXAM: CT ABDOMEN AND PELVIS WITHOUT CONTRAST TECHNIQUE: Multidetector CT imaging of the abdomen and pelvis was performed following the standard protocol without IV contrast. COMPARISON:  04/08/2017 FINDINGS: Lower chest: No acute pleural or parenchymal lung disease. Hepatobiliary: No focal liver abnormality is seen. Status post cholecystectomy. No biliary dilatation. Pancreas: Unremarkable. No pancreatic ductal dilatation or surrounding inflammatory changes. Spleen: Normal in size without focal abnormality. Adrenals/Urinary Tract: Postsurgical changes are seen from left nephro ureterectomy and cystectomy. There is significant distension of the right renal collecting system and right ureter, which may be related to diverting urostomy. There is a drainage catheter coiled within the urinary diversion  right lower quadrant. Stomach/Bowel: No bowel obstruction or ileus. Diverticulosis of the descending and sigmoid colon without diverticulitis. No bowel wall thickening or inflammatory change. Vascular/Lymphatic: Aortic atherosclerosis. No enlarged abdominal or pelvic lymph nodes. Reproductive: Status post hysterectomy. No adnexal masses. Other: No free fluid or free gas. Small fat containing left inguinal hernia unchanged. Musculoskeletal: No acute or destructive bony lesions. Reconstructed images demonstrate no additional findings. IMPRESSION: 1. Obstructive uropathy within the right kidney and right ureter, of uncertain etiology. This could be due, in part, to the urinary diversion in the right lower quadrant. 2.  Postsurgical changes from a left nephro ureterectomy and cystectomy. 3. Diverticulosis without diverticulitis. Electronically Signed   By: Randa Ngo M.D.   On: 09/30/2019 18:50   NM Pulmonary Perfusion  Result Date: 10/01/2019 CLINICAL DATA:  Dyspnea on exertion.  Hemoptysis EXAM: NUCLEAR MEDICINE PERFUSION LUNG SCAN TECHNIQUE: Perfusion images were obtained in multiple projections after intravenous injection of radiopharmaceutical. Ventilation scans intentionally deferred if perfusion scan and chest x-ray adequate for interpretation during COVID 19 epidemic. RADIOPHARMACEUTICALS:  4.2 mCi Tc-73m MAA IV COMPARISON:  Chest radiograph 09/30/2019 FINDINGS: No wedge-shaped peripheral perfusion defect within LEFT or RIGHT lung to suggest acute pulmonary emboli. Large cardiac defect noted. IMPRESSION: No evidence acute pulmonary embolism. Electronically Signed   By: Suzy Bouchard M.D.   On: 10/01/2019 15:59   DG Chest Port 1 View  Result Date: 09/30/2019 CLINICAL DATA:  Hemoptysis EXAM: PORTABLE CHEST 1 VIEW COMPARISON:  None. FINDINGS: There is mild cardiomegaly. Both lungs are clear. No large airspace consolidation or pleural effusion. The visualized skeletal structures are unremarkable. IMPRESSION: No acute cardiopulmonary process Electronically Signed   By: Prudencio Pair M.D.   On: 09/30/2019 18:20   IR EXT NEPHROURETERAL CATH EXCHANGE  Result Date: 10/01/2019 INDICATION: History of bladder cancer post cystectomy and left-sided nephro ureterectomy now with ileal conduit creation and right-sided retrograde ureteral stent placement (all of which was performed at an outside institution), now presenting with renal failure. Preceding noncontrast CT scan abdomen and pelvis demonstrates retraction of the right-sided retrograde ureteral catheter to the level of the distal ureteral-enteric anastomosis. As such, patient presents now for fluoroscopic guided ureteral stent exchange/repositioning and/or  nephrostomy catheter placement. EXAM: FLUOROSCOPIC GUIDED RIGHT SIDED NEPHROURETERAL CATHETER EXCHANGE COMPARISON:  CT abdomen pelvis-earlier same day CONTRAST:  15 mL Isovue-300 administered into the collecting system FLUOROSCOPY TIME:  4 minutes, 24 seconds (42.3 mGy) COMPLICATIONS: None immediate. TECHNIQUE: Informed written consent was obtained from the patient after a discussion of the risks, benefits and alternatives to treatment. Questions regarding the procedure were encouraged and answered. A timeout was performed prior to the initiation of the procedure. External portion of the existing retrograde nephroureteral catheter as well as the surrounding skin was draped in the usual sterile fashion. A sterile drape was applied covering the operative field. Maximum barrier sterile technique with sterile gowns and gloves were used for the procedure. A timeout was performed prior to the initiation of the procedure. A pre procedural spot fluoroscopic image was obtained after contrast was injected via the existing 8 French nephroureteral catheter confirming malpositioning of the nephroureteral catheter located at the level of the distal ureteral enteric anastomosis with faint opacification of the distal aspect the right ureter. The external portion of the catheter was cut, however despite prolonged efforts, the wire could not be advanced through the entirety of the catheter due to the catheter's incrustation. As such, the nephroureteral catheter was removed and with the use of a  regular glidewire, a Kumpe catheter was advanced to the level of the mid aspect of the right ureter. Contrast injection confirmed appropriate positioning Over a Amplatz wire, a new, slightly larger now 12 French, 45 cm all-purpose drainage catheter was placed with distal end ultimately coiled and locked within the right renal pelvis. Contrast injection confirmed appropriate positioning within the renal pelvis and a post exchange fluoroscopic  image was obtained. The catheter was locked and the patient's urostomy was reapplied. The patient tolerated the procedure well without immediate postprocedural complication. FINDINGS: The existing nephroureteral catheter is malpositioned located at the level of the distal ureteral anastomosis. Additionally, the nephroureteral catheter is nearly completely occluded with encrusted debris. After successful fluoroscopic guided exchange, the new, slightly larger now 12 French nephroureteral catheter is appropriately positioned with end coiled and locked within the right renal pelvis. IMPRESSION: Successful fluoroscopic guided exchange, repositioning and up sizing of now 12 French, 45 cm right-sided nephroureteral catheter. Electronically Signed   By: Sandi Mariscal M.D.   On: 10/01/2019 08:08   Medications:  . cycloSPORINE  1 drop Both Eyes BID  . feeding supplement (NEPRO CARB STEADY)  237 mL Oral BID BM  . levothyroxine  100 mcg Oral Q0600  . multivitamin with minerals  1 tablet Oral Daily  . pantoprazole (PROTONIX) IV  40 mg Intravenous Q12H  . sodium chloride flush  10-40 mL Intracatheter Q12H    Assessment 56F Severe AKI and Hyperkalemia with R sided urostomy, likely worsened obstruction.  1. AKI on CKD III:  07/2019 Cr 2, presented with 15+; likely obstructive, s/p emergent IR replacement of ureteral stent and uretral catheter on 7/12.  Slow to improve creatinine, but it is improving.  No uremic symptoms unless her nausea x 4 weeks is from it --> last Cr check late 07/2019 and her nausea started after that though pain from obstruction started 3 weeks ago.  Will continue to monitor off dialysis for now.  Cont to hold ARB, maintain euvolemia, avoid nephrotoxins.  2. Severe hyperkalemia:  improved s/p stent replacement and with medical management; d/c lokelma 3. Metastatic bladder cancer s/p L nephroureterectomy and radical cystectomy with R sided ileal conduit: s/p restenting and drain placement.  Plan  is to f/u with [facility] urology outpt.  4. Metabolic Acidosis: secondary to AKI, improved.  5. Anemia:  Hb 8s, Iron sat 7%, would benefit from IV iron while hospitalized.  Ordered ferric gluconate 250mg  x 2 doses.  6. HTN:   ARB on hold. Has been normotensive, monitor. 7. Hemoptysis:  CXR and VQ unrevealing. Hb stable. Hasn't recurred during admission.  Further w/u if recurs. No indication this is pulmonary renal syndrome --> clear obstruction on imaging that was severe enough to manifest AKI.  Her urine is clear to light yellow.   Jannifer Hick MD 10/02/2019, 10:53 AM  [facility] Pager: [phone]

## 2019-10-02 NOTE — TOC Initial Note (Signed)
Transition of Care [facility]) - Initial/Assessment Note    Patient Details  Name: Shelia Cruz MRN: [id number] Date of Birth: [birth year]  [facility] CM/SW Contact:    Sable Feil, LCSW Phone Number: 10/02/2019, 1:46 PM  Clinical Narrative: CSW talked with patient at the bedside regarding therapy's recommendation of ST rehab. Patient was lying in bed and was agreeable to talking with CSW. Shelia Cruz reported that she lives alone and has 2 daughter who live in [place] and [place].  Patient reported that she has a cat named Kennyth Lose and added tearfully that she has been through a lot and he helped her get through it.   CSW talked with patient regarding therapy's recommendation of ST rehab, and when asked, responded that she has never been to [place] rehab. Rehab in a skilled nursing facility and the facility search process was explained. Patient declined SNF and explained that she cannot be away from her cat for as she has no one that can look after him. The conversation then shifted to patient being safe at home.  Shelia Cruz reported that she has a cane, and a walker and shower bench are being ordered. Also discussed medical alerts, someone she trusts having a key to get into her home in case of emergency - i.e. her friend and outdoor locked key boxes.   Hospital staff came to take patient to a procedure. CSW will follow-up with Shelia Cruz regarding home health services.                  Expected Discharge Plan: [place] Barriers to Discharge: Continued Medical Work up   Patient Goals and CMS Choice Patient states their goals for this hospitalization and ongoing recovery are:: Patient's plans to discarge home once she is medically stable CMS Medicare.gov Compare Post Acute Care list provided to:: Other (Comment Required) (Not provided as patient's d/c plan is home) Choice offered to / list presented to : NA (Choice not offered as patient plans to  discharge home)  Expected Discharge Plan and Services Expected Discharge Plan: [place] In-house Referral: Clinical Social Work   Post Acute Care Choice: Home Health (Will follow-up wtih patient regarding home health) Living arrangements for the past 2 months: Apartment                                      Prior Living Arrangements/Services Living arrangements for the past 2 months: Apartment Lives with:: Self Patient language and need for interpreter reviewed:: No Do you feel safe going back to the place where you live?: Yes      Need for Family Participation in Patient Care: No (Comment) Care giver support system in place?: No (comment)   Criminal Activity/Legal Involvement Pertinent to Current Situation/Hospitalization: No - Comment as needed  Activities of Daily Living Home Assistive Devices/Equipment: Cane (specify quad or straight) ADL Screening (condition at time of admission) Patient's cognitive ability adequate to safely complete daily activities?: Yes Is the patient deaf or have difficulty hearing?: Yes Does the patient have difficulty seeing, even when wearing glasses/contacts?: Yes Does the patient have difficulty concentrating, remembering, or making decisions?: Yes Patient able to express need for assistance with ADLs?: Yes Does the patient have difficulty dressing or bathing?: No Independently performs ADLs?: Yes (appropriate for developmental age) Does the patient have difficulty walking or climbing stairs?: Yes Weakness of  Legs: Both Weakness of Arms/Hands: None  Permission Sought/Granted Permission sought to share information with : Family Supports (Will f/u with patient regardng contacting one of her daughters at discharge) Permission granted to share information with : No              Emotional Assessment Appearance:: Appears stated age Attitude/Demeanor/Rapport: Engaged Affect (typically observed): Pleasant,  Appropriate Orientation: : Oriented to Self, Oriented to Place, Oriented to  Time, Oriented to Situation Alcohol / Substance Use: Tobacco Use, Alcohol Use, Illicit Drugs (Per H&P, patient has never smoked and does not drink alcohol or use illicit drugs) Psych Involvement: No (comment)  Admission diagnosis:  Hyperkalemia [E87.5] Acute renal failure (ARF) (HCC) [N17.9] Obstruction of kidney [N28.89] AKI (acute kidney injury) ([place]) [N17.9] Patient Active Problem List   Diagnosis Date Noted  . AKI (acute kidney injury) ([place]) 09/30/2019  . Hyperkalemia 09/30/2019  . History of bladder cancer 09/30/2019  . History of left nephrectomy 09/30/2019  . History of total cystectomy 09/30/2019  . Metabolic acidosis 86/57/8469  . Hemoptysis 09/30/2019  . Hypothyroidism 09/30/2019  . Restless legs syndrome 09/30/2019  . Ureteral obstruction 09/30/2019  . Acute blood loss anemia 09/30/2019  . Chest tightness 08/13/2018  . Hypoxia 08/13/2018  . Hypomagnesemia 08/13/2018  . Cancer ([place])   . Essential hypertension   . GERD (gastroesophageal reflux disease)    PCP:  Kathyrn Lass, MD Pharmacy:   [place], [place] [address] Phone: [phone] Fax: [phone]     Social Determinants of Health (SDOH) Interventions  No SDOH interventions requested at this time.   Readmission Risk Interventions No flowsheet data found.

## 2019-10-02 NOTE — Plan of Care (Signed)
  Problem: Education: Goal: Knowledge of General Education information will improve Description: Including pain rating scale, medication(s)/side effects and non-pharmacologic comfort measures Outcome: Progressing   Problem: Elimination: Goal: Will not experience complications related to bowel motility Outcome: Progressing   Problem: Safety: Goal: Ability to remain free from injury will improve Outcome: Progressing   

## 2019-10-02 NOTE — Progress Notes (Signed)
PROGRESS NOTE    Shelia Cruz  LKG:[id number] DOB: [birth year] DOA: 09/30/2019 PCP: Kathyrn Lass, MD   Brief Narrative: 69 year old with past medical history significant for metastatic bladder cancer status post left nephroureterectomy and radical cystectomy with ilea conduit and recent stent placement for ureteral obstruction as well as HTN and hypothyroidism presented to ED with weakness and hemoptysis and admitted for acute renal failure, hyperkalemia and hydroureteronephrosis.  Patient has been having multiple symptoms since May notably right side flank pain, decreased urine output, abdominal pain, intermittent hemoptysis and dark stool off and on. -Patient follows with [facility] for urology/oncology and was planning to see nephrology care this Wednesday.  In the ED: Vitals stable, labs remarkable for sodium 134, potassium 7.3, CO2 16 creatinine 15 GFR of 2.  CT abdomen and pelvis: Obstructive uropathy within the right kidney and right ureter of uncertain etiology.  This could be due in part to urinary diversion in the right lower quadrant.  Postsurgical changes from a left nephro rhytidectomy and cystectomy.  Diverticulosis without diverticulitis.  Hyperkalemia treated with insulin, sodium bicarb, Lokelma, calcium gluconate.  IV fluids.  Urology renal and IR consulted.  Urology recommended urgent right nephrostomy tube placement with IR.  Interventional radiology noted malposition right nephroureteral catheter, underwent successful fluoroscopic exchange, repositioning and upsizing of 12 French right side nephroureteral catheter via ileal conduit with and in the renal pelvis.   Assessment & Plan:   Principal Problem:   AKI (acute kidney injury) ([place]) Active Problems:   Essential hypertension   Hyperkalemia   History of bladder cancer   History of left nephrectomy   History of total cystectomy   Metabolic acidosis   Hemoptysis   Hypothyroidism   Restless legs syndrome    Ureteral obstruction   Acute blood loss anemia  1-Acute renal failure on CKD stage IV Hydroureteronephrosis, metastatic bladder cancer Hyperkalemia, metabolic acidosis -Creatinine baseline to 2.5--2.8.,  Patient was admitted with a creatinine of 15/17 BUN of 115, potassium 7.3 -Worsening renal function probably related to malposition at right nephroureteral catheter, and ongoing ARB use -Underwent emergent exchange and upsizing of the right nephroureteral catheter by IR.  -Hyperkalemia improved.  Urine output: 3.5 L last 24-hour -Nephrology following no indication for dialysis as of yet -On Lokelma  2-normocytic anemia, history of metastatic bladder cancer -History of intermittent hemoptysis and melena -Continue with PPI, VQ scan and Dopplers negative negative -Work-up with oncology and neurology at [facility] -She will receive 2 doses of IV iron  Hypertension: Continue to hold losartan Hypothyroidism: Continue with Synthroid Restless leg, MDD, chronic pain Continue with trazodone as needed, Requip dose reduced to 2 mg. Depression; Cymbalta on Hold. She is depress, anxious. Will consult psych for medications recommendations due to inability to resume Cymbalta.   Nutrition Problem: Inadequate oral intake Etiology: decreased appetite    Signs/Symptoms: per patient/family report    Interventions: MVI, Nepro shake  Estimated body mass index is 32.7 kg/m as calculated from the following:   Height as of this encounter: 5\' 6"  (1.676 m).   Weight as of this encounter: 91.9 kg.   DVT prophylaxis: SCD Code Status: DNR/DNI Family Communication:Care discussed with patient. Disposition Plan:  Status is: Inpatient  Remains inpatient appropriate because:Hemodynamically unstable   Dispo: The patient is from: Home              Anticipated d/c is to: SNF to be determine.               Anticipated  d/c date is: 2 days              Patient currently is not medically stable to d/c.         Consultants:   Renal  Urology  IR  Procedures:   7/12 successfulfluoroscopic guided exchange, repositioning and up-sizing of now 12 Fr right sided nephroureteral catheter via ostomy with end coiled in the renal pelvis.  Ronny Bacon, MD  Antimicrobials:    Subjective: She is anxious, crying at time.   Objective: Vitals:   10/01/19 1030 10/01/19 1619 10/01/19 2217 10/02/19 0418  BP: 133/69 137/66 138/71 (!) 144/87  Pulse: 72 68 87 91  Resp: 16 18 14 16   Temp: 98.2 F (36.8 C) 99 F (37.2 C) 99.1 F (37.3 C) 98.9 F (37.2 C)  TempSrc: Oral Oral Oral Oral  SpO2: 94% 95% 96% 95%  Weight:      Height:        Intake/Output Summary (Last 24 hours) at 10/02/2019 1559 Last data filed at 10/02/2019 1300 Gross per 24 hour  Intake 900 ml  Output 3300 ml  Net -2400 ml   Filed Weights   09/30/19 1621 09/30/19 2247 09/30/19 2333  Weight: 88 kg 90.3 kg 91.9 kg    Examination:  General exam: Appears calm and comfortable  Respiratory system: Clear to auscultation.  Cardiovascular system: S1 & S2 heard, RRR.  Gastrointestinal system: Abdomen is nondistended, soft and nontender. No organomegaly or masses felt. Normal bowel sounds heard. Central nervous system: Alert and oriented.  Extremities: Symmetric 5 x 5 power. Skin: No rashes, lesions or ulcers Psychiatry: depressed.    Data Reviewed: I have personally reviewed following labs and imaging studies  CBC: Recent Labs  Lab 09/30/19 1629 09/30/19 1831 10/01/19 0554 10/02/19 0413  WBC 11.0*  --  10.1 10.2  HGB 8.4* 8.8* 7.8* 8.6*  HCT 27.7* 26.0* 25.6* 27.9*  MCV 85.5  --  85.9 86.1  PLT 580*  --  500* 510*   Basic Metabolic Panel: Recent Labs  Lab 09/30/19 2319 09/30/19 2319 10/01/19 0219 10/01/19 0554 10/01/19 1142 10/01/19 2000 10/02/19 0413  NA 138   < > 138 138 139 137 138  K 6.4*   < > 5.8* 5.8* 6.0* 5.9* 4.8  CL 105   < > 105 104 104 101 104  CO2 18*   < > 19* 19* 18* 20* 18*  GLUCOSE  103*   < > 160* 145* 140* 116* 128*  BUN 113*   < > 112* 111* 109* 109* 105*  CREATININE 15.13*   < > 14.56* 14.66* 14.18* 13.47* 12.91*  CALCIUM 9.0   < > 9.5 9.6 9.7 8.9 9.2  MG 2.2  --   --   --   --   --   --   PHOS  --   --   --   --  9.6* 8.4*  --    < > = values in this interval not displayed.   GFR: Estimated Creatinine Clearance: 4.8 mL/min (A) (by C-G formula based on SCr of 12.91 mg/dL (H)). Liver Function Tests: Recent Labs  Lab 09/30/19 2319 10/01/19 1142 10/01/19 2000  AST 6*  --   --   ALT 10  --   --   ALKPHOS 73  --   --   BILITOT 0.8  --   --   PROT 5.9*  --   --   ALBUMIN 2.8* 3.0* 2.8*   No results for  input(s): LIPASE, AMYLASE in the last 168 hours. No results for input(s): AMMONIA in the last 168 hours. Coagulation Profile: Recent Labs  Lab 09/30/19 2111  INR 1.2   Cardiac Enzymes: No results for input(s): CKTOTAL, CKMB, CKMBINDEX, TROPONINI in the last 168 hours. BNP (last 3 results) No results for input(s): PROBNP in the last 8760 hours. HbA1C: No results for input(s): HGBA1C in the last 72 hours. CBG: Recent Labs  Lab 09/30/19 2109 10/01/19 0025  GLUCAP 82 84   Lipid Profile: No results for input(s): CHOL, HDL, LDLCALC, TRIG, CHOLHDL, LDLDIRECT in the last 72 hours. Thyroid Function Tests: No results for input(s): TSH, T4TOTAL, FREET4, T3FREE, THYROIDAB in the last 72 hours. Anemia Panel: Recent Labs    10/01/19 1313  VITAMINB12 852  FOLATE 35.8  FERRITIN 146  TIBC 246*  IRON 18*  RETICCTPCT 1.3   Sepsis Labs: No results for input(s): PROCALCITON, LATICACIDVEN in the last 168 hours.  Recent Results (from the past 240 hour(s))  SARS Coronavirus 2 by RT PCR (hospital order, performed in [facility] hospital lab) Nasopharyngeal Nasopharyngeal Swab     Status: None   Collection Time: 09/30/19  8:02 PM   Specimen: Nasopharyngeal Swab  Result Value Ref Range Status   SARS Coronavirus 2 NEGATIVE NEGATIVE Final    Comment: (NOTE)  SARS-CoV-2 target nucleic acids are NOT DETECTED.  The SARS-CoV-2 RNA is generally detectable in upper and lower respiratory specimens during the acute phase of infection. The lowest concentration of SARS-CoV-2 viral copies this assay can detect is 250 copies / mL. A negative result does not preclude SARS-CoV-2 infection and should not be used as the sole basis for treatment or other patient management decisions.  A negative result may occur with improper specimen collection / handling, submission of specimen other than nasopharyngeal swab, presence of viral mutation(s) within the areas targeted by this assay, and inadequate number of viral copies (<250 copies / mL). A negative result must be combined with clinical observations, patient history, and epidemiological information.  Fact Sheet for Patients:   StrictlyIdeas.no  Fact Sheet for Healthcare Providers: BankingDealers.co.za  This test is not yet approved or  cleared by the [place] FDA and has been authorized for detection and/or diagnosis of SARS-CoV-2 by FDA under an Emergency Use Authorization (EUA).  This EUA will remain in effect (meaning this test can be used) for the duration of the COVID-19 declaration under Section 564(b)(1) of the Act, 21 U.S.C. section 360bbb-3(b)(1), unless the authorization is terminated or revoked sooner.  Performed at [facility], [place] [address], [place], [place] [address]   Culture, Urine     Status: Abnormal   Collection Time: 09/30/19 11:45 PM   Specimen: Urine, Catheterized  Result Value Ref Range Status   Specimen Description URINE, CATHETERIZED  Final   Special Requests   Final    NONE Performed at [facility], [place] [address] [place], [place] [address]    Culture MULTIPLE SPECIES PRESENT, SUGGEST RECOLLECTION (A)  Final   Report Status 10/02/2019 FINAL  Final         Radiology Studies: CT Abdomen Pelvis Wo  Contrast  Result Date: 09/30/2019 CLINICAL DATA:  Generalized weakness, abdominal pain, hemoptysis, history of bladder cancer EXAM: CT ABDOMEN AND PELVIS WITHOUT CONTRAST TECHNIQUE: Multidetector CT imaging of the abdomen and pelvis was performed following the standard protocol without IV contrast. COMPARISON:  04/08/2017 FINDINGS: Lower chest: No acute pleural or parenchymal lung disease. Hepatobiliary: No focal  liver abnormality is seen. Status post cholecystectomy. No biliary dilatation. Pancreas: Unremarkable. No pancreatic ductal dilatation or surrounding inflammatory changes. Spleen: Normal in size without focal abnormality. Adrenals/Urinary Tract: Postsurgical changes are seen from left nephro ureterectomy and cystectomy. There is significant distension of the right renal collecting system and right ureter, which may be related to diverting urostomy. There is a drainage catheter coiled within the urinary diversion right lower quadrant. Stomach/Bowel: No bowel obstruction or ileus. Diverticulosis of the descending and sigmoid colon without diverticulitis. No bowel wall thickening or inflammatory change. Vascular/Lymphatic: Aortic atherosclerosis. No enlarged abdominal or pelvic lymph nodes. Reproductive: Status post hysterectomy. No adnexal masses. Other: No free fluid or free gas. Small fat containing left inguinal hernia unchanged. Musculoskeletal: No acute or destructive bony lesions. Reconstructed images demonstrate no additional findings. IMPRESSION: 1. Obstructive uropathy within the right kidney and right ureter, of uncertain etiology. This could be due, in part, to the urinary diversion in the right lower quadrant. 2. Postsurgical changes from a left nephro ureterectomy and cystectomy. 3. Diverticulosis without diverticulitis. Electronically Signed   By: Randa Ngo M.D.   On: 09/30/2019 18:50   NM Pulmonary Perfusion  Result Date: 10/01/2019 CLINICAL DATA:  Dyspnea on exertion.  Hemoptysis  EXAM: NUCLEAR MEDICINE PERFUSION LUNG SCAN TECHNIQUE: Perfusion images were obtained in multiple projections after intravenous injection of radiopharmaceutical. Ventilation scans intentionally deferred if perfusion scan and chest x-ray adequate for interpretation during COVID 19 epidemic. RADIOPHARMACEUTICALS:  4.2 mCi Tc-61m MAA IV COMPARISON:  Chest radiograph 09/30/2019 FINDINGS: No wedge-shaped peripheral perfusion defect within LEFT or RIGHT lung to suggest acute pulmonary emboli. Large cardiac defect noted. IMPRESSION: No evidence acute pulmonary embolism. Electronically Signed   By: Suzy Bouchard M.D.   On: 10/01/2019 15:59   DG Chest Port 1 View  Result Date: 09/30/2019 CLINICAL DATA:  Hemoptysis EXAM: PORTABLE CHEST 1 VIEW COMPARISON:  None. FINDINGS: There is mild cardiomegaly. Both lungs are clear. No large airspace consolidation or pleural effusion. The visualized skeletal structures are unremarkable. IMPRESSION: No acute cardiopulmonary process Electronically Signed   By: Prudencio Pair M.D.   On: 09/30/2019 18:20   IR EXT NEPHROURETERAL CATH EXCHANGE  Result Date: 10/01/2019 INDICATION: History of bladder cancer post cystectomy and left-sided nephro ureterectomy now with ileal conduit creation and right-sided retrograde ureteral stent placement (all of which was performed at an outside institution), now presenting with renal failure. Preceding noncontrast CT scan abdomen and pelvis demonstrates retraction of the right-sided retrograde ureteral catheter to the level of the distal ureteral-enteric anastomosis. As such, patient presents now for fluoroscopic guided ureteral stent exchange/repositioning and/or nephrostomy catheter placement. EXAM: FLUOROSCOPIC GUIDED RIGHT SIDED NEPHROURETERAL CATHETER EXCHANGE COMPARISON:  CT abdomen pelvis-earlier same day CONTRAST:  15 mL Isovue-300 administered into the collecting system FLUOROSCOPY TIME:  4 minutes, 24 seconds (81.4 mGy) COMPLICATIONS: None  immediate. TECHNIQUE: Informed written consent was obtained from the patient after a discussion of the risks, benefits and alternatives to treatment. Questions regarding the procedure were encouraged and answered. A timeout was performed prior to the initiation of the procedure. External portion of the existing retrograde nephroureteral catheter as well as the surrounding skin was draped in the usual sterile fashion. A sterile drape was applied covering the operative field. Maximum barrier sterile technique with sterile gowns and gloves were used for the procedure. A timeout was performed prior to the initiation of the procedure. A pre procedural spot fluoroscopic image was obtained after contrast was injected via the existing 8 [place] nephroureteral  catheter confirming malpositioning of the nephroureteral catheter located at the level of the distal ureteral enteric anastomosis with faint opacification of the distal aspect the right ureter. The external portion of the catheter was cut, however despite prolonged efforts, the wire could not be advanced through the entirety of the catheter due to the catheter's incrustation. As such, the nephroureteral catheter was removed and with the use of a regular glidewire, a Kum[place] catheter was advanced to the level of the mid aspect of the right ureter. Contrast injection confirmed appropriate positioning Over a Amplatz wire, a new, slightly larger now 12 French, 45 cm all-purpose drainage catheter was placed with distal end ultimately coiled and locked within the right renal pelvis. Contrast injection confirmed appropriate positioning within the renal pelvis and a post exchange fluoroscopic image was obtained. The catheter was locked and the patient's urostomy was reapplied. The patient tolerated the procedure well without immediate postprocedural complication. FINDINGS: The existing nephroureteral catheter is malpositioned located at the level of the distal ureteral  anastomosis. Additionally, the nephroureteral catheter is nearly completely occluded with encrusted debris. After successful fluoroscopic guided exchange, the new, slightly larger now 12 French nephroureteral catheter is appropriately positioned with end coiled and locked within the right renal pelvis. IMPRESSION: Successful fluoroscopic guided exchange, repositioning and up sizing of now 12 French, 45 cm right-sided nephroureteral catheter. Electronically Signed   By: Sandi Mariscal M.D.   On: 10/01/2019 08:08   VAS [place] LOWER EXTREMITY VENOUS (DVT)  Result Date: 10/02/2019  Lower Venous DVTStudy Indications: Edema.  Comparison Study: no prior Performing Technologist: Abram Sander RVS  Examination Guidelines: A complete evaluation includes B-mode imaging, spectral Doppler, color Doppler, and power Doppler as needed of all accessible portions of each vessel. Bilateral testing is considered an integral part of a complete examination. Limited examinations for reoccurring indications may be performed as noted. The reflux portion of the exam is performed with the patient in reverse Trendelenburg.  +---------+---------------+---------+-----------+----------+--------------+ RIGHT    CompressibilityPhasicitySpontaneityPropertiesThrombus Aging +---------+---------------+---------+-----------+----------+--------------+ CFV      Full           Yes      Yes                                 +---------+---------------+---------+-----------+----------+--------------+ SFJ      Full                                                        +---------+---------------+---------+-----------+----------+--------------+ FV Prox  Full                                                        +---------+---------------+---------+-----------+----------+--------------+ FV Mid   Full                                                        +---------+---------------+---------+-----------+----------+--------------+ FV  DistalFull                                                        +---------+---------------+---------+-----------+----------+--------------+  PFV      Full                                                        +---------+---------------+---------+-----------+----------+--------------+ POP      Full           Yes      Yes                                 +---------+---------------+---------+-----------+----------+--------------+ PTV      Full                                                        +---------+---------------+---------+-----------+----------+--------------+ PERO     Full                                                        +---------+---------------+---------+-----------+----------+--------------+   +---------+---------------+---------+-----------+----------+--------------+ LEFT     CompressibilityPhasicitySpontaneityPropertiesThrombus Aging +---------+---------------+---------+-----------+----------+--------------+ CFV      Full           Yes      Yes                                 +---------+---------------+---------+-----------+----------+--------------+ SFJ      Full                                                        +---------+---------------+---------+-----------+----------+--------------+ FV Prox  Full                                                        +---------+---------------+---------+-----------+----------+--------------+ FV Mid   Full                                                        +---------+---------------+---------+-----------+----------+--------------+ FV DistalFull                                                        +---------+---------------+---------+-----------+----------+--------------+ PFV      Full                                                        +---------+---------------+---------+-----------+----------+--------------+  POP      Full           Yes      Yes                                  +---------+---------------+---------+-----------+----------+--------------+ PTV      Full                                                        +---------+---------------+---------+-----------+----------+--------------+ PERO     Full                                                        +---------+---------------+---------+-----------+----------+--------------+     Summary: BILATERAL: - No evidence of deep vein thrombosis seen in the lower extremities, bilaterally. -No evidence of popliteal cyst, bilaterally.   *See table(s) above for measurements and observations.    Preliminary         Scheduled Meds: . cycloSPORINE  1 drop Both Eyes BID  . feeding supplement (NEPRO CARB STEADY)  237 mL Oral BID BM  . levothyroxine  100 mcg Oral Q0600  . multivitamin with minerals  1 tablet Oral Daily  . pantoprazole (PROTONIX) IV  40 mg Intravenous Q12H  . sodium chloride flush  10-40 mL Intracatheter Q12H   Continuous Infusions: . ferric gluconate (FERRLECIT/NULECIT) IV 250 mg (10/02/19 1421)     LOS: 2 days    Time spent: 35 minutes.     Elmarie Shiley, MD [facility]   If 7PM-7AM, please contact night-coverage [email or web address]  10/02/2019, 3:59 PM

## 2019-10-02 NOTE — Plan of Care (Signed)
  Problem: Education: Goal: Knowledge of General Education information will improve Description Including pain rating scale, medication(s)/side effects and non-pharmacologic comfort measures Outcome: Progressing   Problem: Health Behavior/Discharge Planning: Goal: Ability to manage health-related needs will improve Outcome: Progressing   

## 2019-10-02 NOTE — Progress Notes (Signed)
Lower extremity venous has been completed.   Preliminary results in CV Proc.   Abram Sander 10/02/2019 1:33 PM

## 2019-10-03 ENCOUNTER — Inpatient Hospital Stay (HOSPITAL_COMMUNITY): Payer: Medicare Other

## 2019-10-03 LAB — CBC
HCT: 32.8 % — ABNORMAL LOW (ref 36.0–46.0)
Hemoglobin: 10.1 g/dL — ABNORMAL LOW (ref 12.0–15.0)
MCH: 26.2 pg (ref 26.0–34.0)
MCHC: 30.8 g/dL (ref 30.0–36.0)
MCV: 85.2 fL (ref 80.0–100.0)
Platelets: 682 10*3/uL — ABNORMAL HIGH (ref 150–400)
RBC: 3.85 MIL/uL — ABNORMAL LOW (ref 3.87–5.11)
RDW: 16.1 % — ABNORMAL HIGH (ref 11.5–15.5)
WBC: 11.8 10*3/uL — ABNORMAL HIGH (ref 4.0–10.5)
nRBC: 0 % (ref 0.0–0.2)

## 2019-10-03 LAB — RENAL FUNCTION PANEL
Albumin: 3.6 g/dL (ref 3.5–5.0)
Anion gap: 15 (ref 5–15)
BUN: 92 mg/dL — ABNORMAL HIGH (ref 8–23)
CO2: 21 mmol/L — ABNORMAL LOW (ref 22–32)
Calcium: 9.6 mg/dL (ref 8.9–10.3)
Chloride: 103 mmol/L (ref 98–111)
Creatinine, Ser: 10.15 mg/dL — ABNORMAL HIGH (ref 0.44–1.00)
GFR calc Af Amer: 4 mL/min — ABNORMAL LOW (ref >60)
GFR calc non Af Amer: 4 mL/min — ABNORMAL LOW (ref >60)
Glucose, Bld: 160 mg/dL — ABNORMAL HIGH (ref 70–99)
Phosphorus: 5.6 mg/dL — ABNORMAL HIGH (ref 2.5–4.6)
Potassium: 4.1 mmol/L (ref 3.5–5.1)
Sodium: 139 mmol/L (ref 135–145)

## 2019-10-03 LAB — GLUCOSE, CAPILLARY: Glucose-Capillary: 105 mg/dL — ABNORMAL HIGH (ref 70–99)

## 2019-10-03 MED ORDER — DULOXETINE HCL 30 MG PO CPEP
50.0000 mg | ORAL_CAPSULE | Freq: Every day | ORAL | Status: DC
  Administered 2019-10-03 – 2019-10-04 (×2): 50 mg via ORAL
  Filled 2019-10-03 (×2): qty 1

## 2019-10-03 MED ORDER — AMLODIPINE BESYLATE 5 MG PO TABS
2.5000 mg | ORAL_TABLET | Freq: Every day | ORAL | Status: DC
  Administered 2019-10-03 – 2019-10-04 (×2): 2.5 mg via ORAL
  Filled 2019-10-03 (×2): qty 1

## 2019-10-03 MED ORDER — DULOXETINE HCL 20 MG PO CPEP: 20.0000 mg | ORAL_CAPSULE | Freq: Every day | ORAL | Status: DC

## 2019-10-03 NOTE — Progress Notes (Signed)
OT Cancellation Note  Patient Details Name: Shelia Cruz MRN: [id number] DOB: 11-May-1950   Cancelled Treatment:    Reason Eval/Treat Not Completed: Patient at procedure or test/ unavailable. Patient off floor at procedure.  Will follow up as time allows.  August Luz, OTR/L   Phylliss Bob 10/03/2019, 12:15 PM

## 2019-10-03 NOTE — Progress Notes (Signed)
[facility] Progress Note   Subjective:   Nausea x 4 weeks now improving.  RLQ pain related to kidney improving.  No new symptoms.  Able to hydrate orally.  Lab ordered but not done yet this AM. Clarified that her cancer is in remission.  I/Os  yest 1.4 / 4.6.  Objective Vitals:   10/02/19 2116 10/03/19 0522 10/03/19 0925 10/03/19 0941  BP: (!) 177/97 (!) 161/92 (!) 166/94 (!) 165/89  Pulse: 79 61 81 81  Resp: 16 14 15 16   Temp: 98.7 F (37.1 C) 97.8 F (36.6 C) 98.6 F (37 C) 97.6 F (36.4 C)  TempSrc: Oral Oral Oral Oral  SpO2: 94% 95% 96% 95%  Weight: 90.7 kg     Height:       Physical Exam General: nontoxic Lungs: normal WOB Abdomen: urostomy with clear urine in bag, catheter noted into ostomy Extremities: no edema Neuro: conversant  Additional Objective Labs: Basic Metabolic Panel: Recent Labs  Lab 10/01/19 1142 10/01/19 2000 10/02/19 0413  NA 139 137 138  K 6.0* 5.9* 4.8  CL 104 101 104  CO2 18* 20* 18*  GLUCOSE 140* 116* 128*  BUN 109* 109* 105*  CREATININE 14.18* 13.47* 12.91*  CALCIUM 9.7 8.9 9.2  PHOS 9.6* 8.4*  --    Liver Function Tests: Recent Labs  Lab 09/30/19 2319 10/01/19 1142 10/01/19 2000  AST 6*  --   --   ALT 10  --   --   ALKPHOS 73  --   --   BILITOT 0.8  --   --   PROT 5.9*  --   --   ALBUMIN 2.8* 3.0* 2.8*   No results for input(s): LIPASE, AMYLASE in the last 168 hours. CBC: Recent Labs  Lab 09/30/19 1629 09/30/19 1629 09/30/19 1831 10/01/19 0554 10/02/19 0413  WBC 11.0*  --   --  10.1 10.2  HGB 8.4*   < > 8.8* 7.8* 8.6*  HCT 27.7*   < > 26.0* 25.6* 27.9*  MCV 85.5  --   --  85.9 86.1  PLT 580*  --   --  500* 563*   < > = values in this interval not displayed.   Blood Culture    Component Value Date/Time   SDES URINE, CATHETERIZED 09/30/2019 2345   SPECREQUEST  09/30/2019 2345    NONE Performed at [facility], [place] [address] [place], [place] [address]    CULT MULTIPLE SPECIES  PRESENT, SUGGEST RECOLLECTION (A) 09/30/2019 2345   REPTSTATUS 10/02/2019 FINAL 09/30/2019 2345    Cardiac Enzymes: No results for input(s): CKTOTAL, CKMB, CKMBINDEX, TROPONINI in the last 168 hours. CBG: Recent Labs  Lab 09/30/19 2109 10/01/19 0025  GLUCAP 82 84   Iron Studies:  Recent Labs    10/01/19 1313  IRON 18*  TIBC 246*  FERRITIN 146   @[email or web address] @ Studies/Results: NM Pulmonary Perfusion  Result Date: 10/01/2019 CLINICAL DATA:  Dyspnea on exertion.  Hemoptysis EXAM: NUCLEAR MEDICINE PERFUSION LUNG SCAN TECHNIQUE: Perfusion images were obtained in multiple projections after intravenous injection of radiopharmaceutical. Ventilation scans intentionally deferred if perfusion scan and chest x-ray adequate for interpretation during COVID 19 epidemic. RADIOPHARMACEUTICALS:  4.2 mCi Tc-61m MAA IV COMPARISON:  Chest radiograph 09/30/2019 FINDINGS: No wedge-shaped peripheral perfusion defect within LEFT or RIGHT lung to suggest acute pulmonary emboli. Large cardiac defect noted. IMPRESSION: No evidence acute pulmonary embolism. Electronically Signed   By: Suzy Bouchard M.D.   On: 10/01/2019 15:59  VAS [place] LOWER EXTREMITY VENOUS (DVT)  Result Date: 10/02/2019  Lower Venous DVTStudy Indications: Edema.  Comparison Study: no prior Performing Technologist: Abram Sander RVS  Examination Guidelines: A complete evaluation includes B-mode imaging, spectral Doppler, color Doppler, and power Doppler as needed of all accessible portions of each vessel. Bilateral testing is considered an integral part of a complete examination. Limited examinations for reoccurring indications may be performed as noted. The reflux portion of the exam is performed with the patient in reverse Trendelenburg.  +---------+---------------+---------+-----------+----------+--------------+ RIGHT    CompressibilityPhasicitySpontaneityPropertiesThrombus Aging  +---------+---------------+---------+-----------+----------+--------------+ CFV      Full           Yes      Yes                                 +---------+---------------+---------+-----------+----------+--------------+ SFJ      Full                                                        +---------+---------------+---------+-----------+----------+--------------+ FV Prox  Full                                                        +---------+---------------+---------+-----------+----------+--------------+ FV Mid   Full                                                        +---------+---------------+---------+-----------+----------+--------------+ FV DistalFull                                                        +---------+---------------+---------+-----------+----------+--------------+ PFV      Full                                                        +---------+---------------+---------+-----------+----------+--------------+ POP      Full           Yes      Yes                                 +---------+---------------+---------+-----------+----------+--------------+ PTV      Full                                                        +---------+---------------+---------+-----------+----------+--------------+ PERO     Full                                                        +---------+---------------+---------+-----------+----------+--------------+   +---------+---------------+---------+-----------+----------+--------------+  LEFT     CompressibilityPhasicitySpontaneityPropertiesThrombus Aging +---------+---------------+---------+-----------+----------+--------------+ CFV      Full           Yes      Yes                                 +---------+---------------+---------+-----------+----------+--------------+ SFJ      Full                                                         +---------+---------------+---------+-----------+----------+--------------+ FV Prox  Full                                                        +---------+---------------+---------+-----------+----------+--------------+ FV Mid   Full                                                        +---------+---------------+---------+-----------+----------+--------------+ FV DistalFull                                                        +---------+---------------+---------+-----------+----------+--------------+ PFV      Full                                                        +---------+---------------+---------+-----------+----------+--------------+ POP      Full           Yes      Yes                                 +---------+---------------+---------+-----------+----------+--------------+ PTV      Full                                                        +---------+---------------+---------+-----------+----------+--------------+ PERO     Full                                                        +---------+---------------+---------+-----------+----------+--------------+     Summary: BILATERAL: - No evidence of deep vein thrombosis seen in the lower extremities, bilaterally. -No evidence of popliteal cyst, bilaterally.   *See table(s) above for measurements and observations. Electronically signed by Curt Jews MD on 10/02/2019 at 7:35:06 PM.  Final    Medications: . ferric gluconate (FERRLECIT/NULECIT) IV 250 mg (10/02/19 1421)   . cycloSPORINE  1 drop Both Eyes BID  . feeding supplement (NEPRO CARB STEADY)  237 mL Oral BID BM  . levothyroxine  100 mcg Oral Q0600  . multivitamin with minerals  1 tablet Oral Daily  . pantoprazole (PROTONIX) IV  40 mg Intravenous Q12H  . sodium chloride flush  10-40 mL Intracatheter Q12H    Assessment 104F Severe AKI and Hyperkalemia with R sided urostomy, likely worsened obstruction.  1. AKI on CKD III:  07/2019 Cr  2, presented with 15+; likely obstructive, s/p emergent IR replacement of ureteral stent and uretral catheter on 7/12.  Slow to improve creatinine, but it is improving.  No clear uremic symptoms though her nausea may be related and is improving. Will continue to monitor off dialysis for now.  Cont to hold ARB, maintain euvolemia, avoid nephrotoxins.    2. Polyuria: post obstructive diuresis.  Able to hydrate orally, serum sodium ok.  No need for IVF repletion unless cannot take po fluids.   3. Severe hyperkalemia:  improved s/p stent replacement and with medical management; d/c lokelma 4. Metastatic bladder cancer s/p L nephroureterectomy and radical cystectomy with R sided ileal conduit: s/p restenting and drain placement.  Plan is to f/u with [facility] urology outpt.   In remission.  5. Metabolic Acidosis: secondary to AKI, improved. If bicarb down more start po bicarb supplement. 6. Anemia:  Hb 8s, Iron sat 7%, would benefit from IV iron while hospitalized.  Ordered ferric gluconate 250mg  x 2 doses.  Hold on ESA for now. 7. HTN:   ARB on hold. Has been normotensive, until this AM.  She's having an osmotic diuresis so hold on starting agent at the moment. 8. Hemoptysis:  CXR and VQ unrevealing. Hb stable. Hasn't recurred during admission.  Further w/u if recurs. No indication this is pulmonary renal syndrome --> clear obstruction on imaging that was severe enough to manifest AKI.  Her urine is clear to light yellow.   We discussed today = hasn't recurrent but if does would w/u further.  Jannifer Hick MD 10/03/2019, 9:42 AM  [facility] Pager: [phone]

## 2019-10-03 NOTE — Progress Notes (Signed)
Physical Therapy Treatment Patient Details Name: Shelia Cruz MRN: [id number] DOB: [birth year] Today's Date: 10/03/2019    History of Present Illness Shelia Cruz is a 69 y.o. female with PMH of metastatic bladder cancer s/p left nephroureterectomy and radical cystectomy with ileal conduit and recent stent placement for ureteral obstruction as well as HTN and hypothyroidism presented to ED with weakness and hemoptysis and admitted for acute renal failure, hyperkalemia and hydroureteronephrosis.    PT Comments    Pt limited with functional mobility this session secondary to persistent dizziness and elevated BP in all positions (see below for details). Pt's RN aware and reporting that pt is scheduled to go for an MRI very soon. Pt able to perform bed mobility at a mod I level, transfers with supervision and took a few side steps at EOB with RW and min guard for safety. Pt would continue to benefit from skilled physical therapy services at this time while admitted and after d/c to address the below listed limitations in order to improve overall safety and independence with functional mobility.  BP supine = 159/93 mmHg BP sitting = 177/107 mmHg BP standing = 176/108 mmHg RN aware.      Follow Up Recommendations  Home health PT;Supervision/Assistance - 24 hour (pt declining SNF placement; wants to return home to her cat)     Equipment Recommendations  Rolling walker with 5" wheels;3in1 (PT)    Recommendations for Other Services       Precautions / Restrictions Precautions Precautions: Fall Precaution Comments: urostomy  Restrictions Weight Bearing Restrictions: No    Mobility  Bed Mobility Overal bed mobility: Modified Independent             General bed mobility comments: increased time needed  Transfers Overall transfer level: Needs assistance Equipment used: Rolling walker (2 wheeled) Transfers: Sit to/from Stand Sit to Stand: Supervision         General  transfer comment: good technique utilized, supervision for safety  Ambulation/Gait             General Gait Details: pt able to take a few side steps at EOB with RW and min guard for safety; deferred further gait training as pt with persistent dizziness, elevated BP and RN reporting pt will be transported to MRI very soon   Chief Strategy Officer    Modified Rankin (Stroke Patients Only)       Balance Overall balance assessment: Needs assistance Sitting-balance support: Feet supported Sitting balance-Leahy Scale: Good     Standing balance support: Bilateral upper extremity supported;During functional activity Standing balance-Leahy Scale: Poor                              Cognition Arousal/Alertness: Awake/alert Behavior During Therapy: WFL for tasks assessed/performed Overall Cognitive Status: Within Functional Limits for tasks assessed                                        Exercises      General Comments        Pertinent Vitals/Pain Pain Assessment: No/denies pain    Home Living                      Prior Function  PT Goals (current goals can now be found in the care plan section) Acute Rehab PT Goals PT Goal Formulation: With patient Time For Goal Achievement: 10/15/19 Potential to Achieve Goals: Good Progress towards PT goals: Progressing toward goals    Frequency    Min 3X/week      PT Plan Discharge plan needs to be updated    Co-evaluation              AM-PAC PT "6 Clicks" Mobility   Outcome Measure  Help needed turning from your back to your side while in a flat bed without using bedrails?: None Help needed moving from lying on your back to sitting on the side of a flat bed without using bedrails?: None Help needed moving to and from a bed to a chair (including a wheelchair)?: None Help needed standing up from a chair using your arms (e.g., wheelchair or  bedside chair)?: None Help needed to walk in hospital room?: A Little Help needed climbing 3-5 steps with a railing? : A Lot 6 Click Score: 21    End of Session   Activity Tolerance: Treatment limited secondary to medical complications (Comment);Other (comment) (limited secondary to elevated BP and persistent dizziness) Patient left: in bed;with call bell/phone within reach;with bed alarm set Nurse Communication: Mobility status;Other (comment) (elevated BP) PT Visit Diagnosis: Unsteadiness on feet (R26.81);Repeated falls (R29.6);Muscle weakness (generalized) (M62.81)     Time: 8937-3428 PT Time Calculation (min) (ACUTE ONLY): 16 min  Charges:  $Therapeutic Activity: 8-22 mins                     Anastasio Champion, DPT  Acute Rehabilitation Services Pager [phone] Office [place] 10/03/2019, 12:24 PM

## 2019-10-03 NOTE — Plan of Care (Signed)
  Problem: Education: Goal: Knowledge of General Education information will improve Description Including pain rating scale, medication(s)/side effects and non-pharmacologic comfort measures Outcome: Progressing   

## 2019-10-03 NOTE — Plan of Care (Signed)

## 2019-10-03 NOTE — Consult Note (Signed)
Patient off the floor to MRI, unable to complete psychiatric assessment. Per consult order patient can no longer use Cymbalta due to her kideny disease, should we taper dose?   Depending on how long she has been on the Cymbalta, will recommend 50% dose reduction taper for 3 days then another 50% reduction for 3 days and then discontinue. Previously taking Cymbalta 90mg  po daily, will reduced to 50mg  po daily  07/15-07/17, then 20mg  07/18-07/20 po daily. Please reconsult if you need any further assistance.

## 2019-10-03 NOTE — Progress Notes (Signed)
PROGRESS NOTE    Shelia Cruz  XBJ:[id number] DOB: 1950-04-06 DOA: 09/30/2019 PCP: Kathyrn Lass, MD   Brief Narrative: 69 year old with past medical history significant for metastatic bladder cancer status post left nephroureterectomy and radical cystectomy with ilea conduit and recent stent placement for ureteral obstruction as well as HTN and hypothyroidism presented to ED with weakness and hemoptysis and admitted for acute renal failure, hyperkalemia and hydroureteronephrosis.  Patient has been having multiple symptoms since May notably right side flank pain, decreased urine output, abdominal pain, intermittent hemoptysis and dark stool off and on. -Patient follows with [facility] for urology/oncology and was planning to see nephrology care this Wednesday.  In the ED: Vitals stable, labs remarkable for sodium 134, potassium 7.3, CO2 16 creatinine 15 GFR of 2.  CT abdomen and pelvis: Obstructive uropathy within the right kidney and right ureter of uncertain etiology.  This could be due in part to urinary diversion in the right lower quadrant.  Postsurgical changes from a left nephro rhytidectomy and cystectomy.  Diverticulosis without diverticulitis.  Hyperkalemia treated with insulin, sodium bicarb, Lokelma, calcium gluconate.  IV fluids.  Urology renal and IR consulted.  Urology recommended urgent right nephrostomy tube placement with IR.  Interventional radiology noted malposition right nephroureteral catheter, underwent successful fluoroscopic exchange, repositioning and upsizing of 12 French right side nephroureteral catheter via ileal conduit with and in the renal pelvis.   Assessment & Plan:   Principal Problem:   AKI (acute kidney injury) ([place]) Active Problems:   Essential hypertension   Hyperkalemia   History of bladder cancer   History of left nephrectomy   History of total cystectomy   Metabolic acidosis   Hemoptysis   Hypothyroidism   Restless legs syndrome    Ureteral obstruction   Acute blood loss anemia  1-Acute renal failure on CKD stage IV Hydroureteronephrosis, metastatic bladder cancer Hyperkalemia, metabolic acidosis -Creatinine baseline to 2.5--2.8.,  Patient was admitted with a creatinine of 15/17 BUN of 115, potassium 7.3 -Worsening renal function probably related to malposition at right nephroureteral catheter, and ongoing ARB use -Underwent emergent exchange and upsizing of the right nephroureteral catheter by IR.  -Hyperkalemia improved.  Urine output: 3.5 L last 24-hour -Nephrology following no indication for dialysis as of yet Improving.   2-normocytic anemia, history of metastatic bladder cancer -History of intermittent hemoptysis and melena -Continue with PPI, VQ scan and Dopplers negative negative -Work-up with oncology and neurology at [facility] -She will receive 2 doses of IV iron Hb stable.   Hypertension: Continue to hold losartan. Start low dose norvasc.  Hypothyroidism: Continue with Synthroid Restless leg, MDD, chronic pain Continue with trazodone as needed, Requip dose reduced to 2 mg. Depression; Cymbalta on Hold. She is depress, anxious. Will consult psych for medications recommendations due to inability to resume Cymbalta.  Psych recommend taper dose of Cymbalta.   Dizziness;  Check EKG; showed sinus rhythm.  cbg normal.  MRI negative for acute stroke.   Nutrition Problem: Inadequate oral intake Etiology: decreased appetite    Signs/Symptoms: per patient/family report    Interventions: MVI, Nepro shake  Estimated body mass index is 32.27 kg/m as calculated from the following:   Height as of this encounter: 5\' 6"  (1.676 m).   Weight as of this encounter: 90.7 kg.   DVT prophylaxis: SCD Code Status: DNR/DNI Family Communication:Care discussed with patient. Disposition Plan:  Status is: Inpatient  Remains inpatient appropriate because:Hemodynamically unstable   Dispo: The patient is from:  Home  Anticipated d/c is to: SNF to be determine.               Anticipated d/c date is: 2 days              Patient currently is not medically stable to d/c.        Consultants:   Renal  Urology  IR  Procedures:   7/12 successfulfluoroscopic guided exchange, repositioning and up-sizing of now 12 Fr right sided nephroureteral catheter via ostomy with end coiled in the renal pelvis.  Ronny Bacon, MD  Antimicrobials:    Subjective: She report dizziness today. Unable to explain exactly. Does not feels she is going to pass out.    Objective: Vitals:   10/02/19 2116 10/03/19 0522 10/03/19 0925 10/03/19 0941  BP: (!) 177/97 (!) 161/92 (!) 166/94 (!) 165/89  Pulse: 79 61 81 81  Resp: 16 14 15 16   Temp: 98.7 F (37.1 C) 97.8 F (36.6 C) 98.6 F (37 C) 97.6 F (36.4 C)  TempSrc: Oral Oral Oral Oral  SpO2: 94% 95% 96% 95%  Weight: 90.7 kg     Height:        Intake/Output Summary (Last 24 hours) at 10/03/2019 1003 Last data filed at 10/03/2019 0857 Gross per 24 hour  Intake 1077 ml  Output 4050 ml  Net -2973 ml   Filed Weights   09/30/19 2247 09/30/19 2333 10/02/19 2116  Weight: 90.3 kg 91.9 kg 90.7 kg    Examination:  General exam:NAD Respiratory system: CTA Cardiovascular system: S 1, S 2 RRR  Gastrointestinal system: BS present, soft, nt Central nervous system; alert, non focal.  Extremities: Symmetric power Skin: no rashes.   Data Reviewed: I have personally reviewed following labs and imaging studies  CBC: Recent Labs  Lab 09/30/19 1629 09/30/19 1831 10/01/19 0554 10/02/19 0413  WBC 11.0*  --  10.1 10.2  HGB 8.4* 8.8* 7.8* 8.6*  HCT 27.7* 26.0* 25.6* 27.9*  MCV 85.5  --  85.9 86.1  PLT 580*  --  500* 237*   Basic Metabolic Panel: Recent Labs  Lab 09/30/19 2319 09/30/19 2319 10/01/19 0219 10/01/19 0554 10/01/19 1142 10/01/19 2000 10/02/19 0413  NA 138   < > 138 138 139 137 138  K 6.4*   < > 5.8* 5.8* 6.0* 5.9* 4.8    CL 105   < > 105 104 104 101 104  CO2 18*   < > 19* 19* 18* 20* 18*  GLUCOSE 103*   < > 160* 145* 140* 116* 128*  BUN 113*   < > 112* 111* 109* 109* 105*  CREATININE 15.13*   < > 14.56* 14.66* 14.18* 13.47* 12.91*  CALCIUM 9.0   < > 9.5 9.6 9.7 8.9 9.2  MG 2.2  --   --   --   --   --   --   PHOS  --   --   --   --  9.6* 8.4*  --    < > = values in this interval not displayed.   GFR: Estimated Creatinine Clearance: 4.7 mL/min (A) (by C-G formula based on SCr of 12.91 mg/dL (H)). Liver Function Tests: Recent Labs  Lab 09/30/19 2319 10/01/19 1142 10/01/19 2000  AST 6*  --   --   ALT 10  --   --   ALKPHOS 73  --   --   BILITOT 0.8  --   --   PROT 5.9*  --   --  ALBUMIN 2.8* 3.0* 2.8*   No results for input(s): LIPASE, AMYLASE in the last 168 hours. No results for input(s): AMMONIA in the last 168 hours. Coagulation Profile: Recent Labs  Lab 09/30/19 2111  INR 1.2   Cardiac Enzymes: No results for input(s): CKTOTAL, CKMB, CKMBINDEX, TROPONINI in the last 168 hours. BNP (last 3 results) No results for input(s): PROBNP in the last 8760 hours. HbA1C: No results for input(s): HGBA1C in the last 72 hours. CBG: Recent Labs  Lab 09/30/19 2109 10/01/19 0025  GLUCAP 82 84   Lipid Profile: No results for input(s): CHOL, HDL, LDLCALC, TRIG, CHOLHDL, LDLDIRECT in the last 72 hours. Thyroid Function Tests: No results for input(s): TSH, T4TOTAL, FREET4, T3FREE, THYROIDAB in the last 72 hours. Anemia Panel: Recent Labs    10/01/19 1313  VITAMINB12 852  FOLATE 35.8  FERRITIN 146  TIBC 246*  IRON 18*  RETICCTPCT 1.3   Sepsis Labs: No results for input(s): PROCALCITON, LATICACIDVEN in the last 168 hours.  Recent Results (from the past 240 hour(s))  SARS Coronavirus 2 by RT PCR (hospital order, performed in [facility] hospital lab) Nasopharyngeal Nasopharyngeal Swab     Status: None   Collection Time: 09/30/19  8:02 PM   Specimen: Nasopharyngeal Swab  Result Value Ref  Range Status   SARS Coronavirus 2 NEGATIVE NEGATIVE Final    Comment: (NOTE) SARS-CoV-2 target nucleic acids are NOT DETECTED.  The SARS-CoV-2 RNA is generally detectable in upper and lower respiratory specimens during the acute phase of infection. The lowest concentration of SARS-CoV-2 viral copies this assay can detect is 250 copies / mL. A negative result does not preclude SARS-CoV-2 infection and should not be used as the sole basis for treatment or other patient management decisions.  A negative result may occur with improper specimen collection / handling, submission of specimen other than nasopharyngeal swab, presence of viral mutation(s) within the areas targeted by this assay, and inadequate number of viral copies (<250 copies / mL). A negative result must be combined with clinical observations, patient history, and epidemiological information.  Fact Sheet for Patients:   StrictlyIdeas.no  Fact Sheet for Healthcare Providers: BankingDealers.co.za  This test is not yet approved or  cleared by the [place] FDA and has been authorized for detection and/or diagnosis of SARS-CoV-2 by FDA under an Emergency Use Authorization (EUA).  This EUA will remain in effect (meaning this test can be used) for the duration of the COVID-19 declaration under Section 564(b)(1) of the Act, 21 U.S.C. section 360bbb-3(b)(1), unless the authorization is terminated or revoked sooner.  Performed at [facility], [place] [address] [place], [place] [address]   Culture, Urine     Status: Abnormal   Collection Time: 09/30/19 11:45 PM   Specimen: Urine, Catheterized  Result Value Ref Range Status   Specimen Description URINE, CATHETERIZED  Final   Special Requests   Final    NONE Performed at [facility], [place] [address] [place], [place] [address]    Culture MULTIPLE SPECIES PRESENT, SUGGEST RECOLLECTION (A)  Final   Report Status  10/02/2019 FINAL  Final         Radiology Studies: NM Pulmonary Perfusion  Result Date: 10/01/2019 CLINICAL DATA:  Dyspnea on exertion.  Hemoptysis EXAM: NUCLEAR MEDICINE PERFUSION LUNG SCAN TECHNIQUE: Perfusion images were obtained in multiple projections after intravenous injection of radiopharmaceutical. Ventilation scans intentionally deferred if perfusion scan and chest x-ray adequate for interpretation during COVID 19 epidemic. RADIOPHARMACEUTICALS:  4.2 mCi Tc-70m MAA IV COMPARISON:  Chest radiograph 09/30/2019 FINDINGS: No wedge-shaped peripheral perfusion defect within LEFT or RIGHT lung to suggest acute pulmonary emboli. Large cardiac defect noted. IMPRESSION: No evidence acute pulmonary embolism. Electronically Signed   By: Suzy Bouchard M.D.   On: 10/01/2019 15:59   VAS [place] LOWER EXTREMITY VENOUS (DVT)  Result Date: 10/02/2019  Lower Venous DVTStudy Indications: Edema.  Comparison Study: no prior Performing Technologist: Abram Sander RVS  Examination Guidelines: A complete evaluation includes B-mode imaging, spectral Doppler, color Doppler, and power Doppler as needed of all accessible portions of each vessel. Bilateral testing is considered an integral part of a complete examination. Limited examinations for reoccurring indications may be performed as noted. The reflux portion of the exam is performed with the patient in reverse Trendelenburg.  +---------+---------------+---------+-----------+----------+--------------+ RIGHT    CompressibilityPhasicitySpontaneityPropertiesThrombus Aging +---------+---------------+---------+-----------+----------+--------------+ CFV      Full           Yes      Yes                                 +---------+---------------+---------+-----------+----------+--------------+ SFJ      Full                                                        +---------+---------------+---------+-----------+----------+--------------+ FV Prox  Full                                                         +---------+---------------+---------+-----------+----------+--------------+ FV Mid   Full                                                        +---------+---------------+---------+-----------+----------+--------------+ FV DistalFull                                                        +---------+---------------+---------+-----------+----------+--------------+ PFV      Full                                                        +---------+---------------+---------+-----------+----------+--------------+ POP      Full           Yes      Yes                                 +---------+---------------+---------+-----------+----------+--------------+ PTV      Full                                                        +---------+---------------+---------+-----------+----------+--------------+  PERO     Full                                                        +---------+---------------+---------+-----------+----------+--------------+   +---------+---------------+---------+-----------+----------+--------------+ LEFT     CompressibilityPhasicitySpontaneityPropertiesThrombus Aging +---------+---------------+---------+-----------+----------+--------------+ CFV      Full           Yes      Yes                                 +---------+---------------+---------+-----------+----------+--------------+ SFJ      Full                                                        +---------+---------------+---------+-----------+----------+--------------+ FV Prox  Full                                                        +---------+---------------+---------+-----------+----------+--------------+ FV Mid   Full                                                        +---------+---------------+---------+-----------+----------+--------------+ FV DistalFull                                                         +---------+---------------+---------+-----------+----------+--------------+ PFV      Full                                                        +---------+---------------+---------+-----------+----------+--------------+ POP      Full           Yes      Yes                                 +---------+---------------+---------+-----------+----------+--------------+ PTV      Full                                                        +---------+---------------+---------+-----------+----------+--------------+ PERO     Full                                                        +---------+---------------+---------+-----------+----------+--------------+  Summary: BILATERAL: - No evidence of deep vein thrombosis seen in the lower extremities, bilaterally. -No evidence of popliteal cyst, bilaterally.   *See table(s) above for measurements and observations. Electronically signed by Curt Jews MD on 10/02/2019 at 7:35:06 PM.    Final         Scheduled Meds: . amLODipine  2.5 mg Oral Daily  . cycloSPORINE  1 drop Both Eyes BID  . feeding supplement (NEPRO CARB STEADY)  237 mL Oral BID BM  . levothyroxine  100 mcg Oral Q0600  . multivitamin with minerals  1 tablet Oral Daily  . pantoprazole (PROTONIX) IV  40 mg Intravenous Q12H  . sodium chloride flush  10-40 mL Intracatheter Q12H   Continuous Infusions: . ferric gluconate (FERRLECIT/NULECIT) IV Stopped (10/03/19 0954)     LOS: 3 days    Time spent: 35 minutes.     Elmarie Shiley, MD [facility]   If 7PM-7AM, please contact night-coverage [email or web address]  10/03/2019, 10:03 AM

## 2019-10-03 NOTE — Progress Notes (Signed)
Occupational Therapy Treatment Patient Details Name: Shelia Cruz MRN: [id number] DOB: 09-09-1950 Today's Date: 10/03/2019    History of present illness Shelia Cruz is a 69 y.o. female with PMH of metastatic bladder cancer s/p left nephroureterectomy and radical cystectomy with ileal conduit and recent stent placement for ureteral obstruction as well as HTN and hypothyroidism presented to ED with weakness and hemoptysis and admitted for acute renal failure, hyperkalemia and hydroureteronephrosis.   OT comments  Patient supine in bed on arrival.  Emotional from beginning as she stated she was really not feeling well and upset she is not making progress.  Feeling dizzy and nauseous but agreeable to brushing teeth at EOB.  Able to get to EOB with supervision. Stood for 1 min and moved legs/marched with bilateral UE support, but needing to sit due to nausea.  Will continue to follow with OT acutely to address the deficits listed below.    Follow Up Recommendations  Home health OT;Supervision/Assistance - 24 hour;Other (comment) (patient declining SNF)    Equipment Recommendations  Tub/shower bench;Other (comment);3 in 1 bedside commode (RW)    Recommendations for Other Services      Precautions / Restrictions Precautions Precautions: Fall Precaution Comments: urostomy  Restrictions Weight Bearing Restrictions: No       Mobility Bed Mobility Overal bed mobility: Needs Assistance Bed Mobility: Supine to Sit;Sit to Supine     Supine to sit: Supervision Sit to supine: Supervision   General bed mobility comments: increased time needed  Transfers Overall transfer level: Needs assistance Equipment used: 2 person hand held assist Transfers: Sit to/from Stand Sit to Stand: Supervision         General transfer comment: good technique utilized, supervision for safety    Balance Overall balance assessment: Needs assistance Sitting-balance support: Feet supported Sitting  balance-Leahy Scale: Good     Standing balance support: Bilateral upper extremity supported;During functional activity Standing balance-Leahy Scale: Poor Standing balance comment: kept hand on rail and hand on surface                           ADL either performed or assessed with clinical judgement   ADL Overall ADL's : Needs assistance/impaired     Grooming: Set up;Sitting                               Functional mobility during ADLs: Minimal assistance General ADL Comments: Patient limited by nausea and dizziness, BP elevated     Vision       Perception     Praxis      Cognition Arousal/Alertness: Awake/alert Behavior During Therapy: WFL for tasks assessed/performed Overall Cognitive Status: Within Functional Limits for tasks assessed                                 General Comments: Patient emotional about not making progress and feeling "terrible"        Exercises Exercises: Other exercises Other Exercises Other Exercises: Stand and march ~1 min   Shoulder Instructions       General Comments BP 183/107.      Pertinent Vitals/ Pain       Pain Assessment: Faces Faces Pain Scale: Hurts little more Pain Location: generalized Pain Descriptors / Indicators: Grimacing;Discomfort Pain Intervention(s): Limited activity within patient's tolerance;Monitored during session  Home Living  Prior Functioning/Environment              Frequency  Min 2X/week        Progress Toward Goals  OT Goals(current goals can now be found in the care plan section)  Progress towards OT goals: Progressing toward goals  Acute Rehab OT Goals Patient Stated Goal: to get stronger and safe OT Goal Formulation: With patient Time For Goal Achievement: 10/15/19 Potential to Achieve Goals: Good  Plan Discharge plan needs to be updated    Co-evaluation                  AM-PAC OT "6 Clicks" Daily Activity     Outcome Measure   Help from another person eating meals?: None Help from another person taking care of personal grooming?: A Little Help from another person toileting, which includes using toliet, bedpan, or urinal?: A Little Help from another person bathing (including washing, rinsing, drying)?: A Little Help from another person to put on and taking off regular upper body clothing?: A Little Help from another person to put on and taking off regular lower body clothing?: A Little 6 Click Score: 19    End of Session    OT Visit Diagnosis: Unsteadiness on feet (R26.81);Other abnormalities of gait and mobility (R26.89);Repeated falls (R29.6);History of falling (Z91.81)   Activity Tolerance Patient tolerated treatment well   Patient Left in bed;with call bell/phone within reach   Nurse Communication Mobility status        Time: 1341-1355 OT Time Calculation (min): 14 min  Charges: OT General Charges $OT Visit: 1 Visit OT Treatments $Self Care/Home Management : 8-22 mins  August Luz, OTR/L    Phylliss Bob 10/03/2019, 2:17 PM

## 2019-10-04 LAB — CBC
HCT: 33.1 % — ABNORMAL LOW (ref 36.0–46.0)
Hemoglobin: 10 g/dL — ABNORMAL LOW (ref 12.0–15.0)
MCH: 25.8 pg — ABNORMAL LOW (ref 26.0–34.0)
MCHC: 30.2 g/dL (ref 30.0–36.0)
MCV: 85.3 fL (ref 80.0–100.0)
Platelets: 632 10*3/uL — ABNORMAL HIGH (ref 150–400)
RBC: 3.88 MIL/uL (ref 3.87–5.11)
RDW: 15.8 % — ABNORMAL HIGH (ref 11.5–15.5)
WBC: 11.7 10*3/uL — ABNORMAL HIGH (ref 4.0–10.5)
nRBC: 0 % (ref 0.0–0.2)

## 2019-10-04 LAB — BASIC METABOLIC PANEL
Anion gap: 13 (ref 5–15)
BUN: 81 mg/dL — ABNORMAL HIGH (ref 8–23)
CO2: 23 mmol/L (ref 22–32)
Calcium: 9.5 mg/dL (ref 8.9–10.3)
Chloride: 103 mmol/L (ref 98–111)
Creatinine, Ser: 8.78 mg/dL — ABNORMAL HIGH (ref 0.44–1.00)
GFR calc Af Amer: 5 mL/min — ABNORMAL LOW (ref >60)
GFR calc non Af Amer: 4 mL/min — ABNORMAL LOW (ref >60)
Glucose, Bld: 108 mg/dL — ABNORMAL HIGH (ref 70–99)
Potassium: 4 mmol/L (ref 3.5–5.1)
Sodium: 139 mmol/L (ref 135–145)

## 2019-10-04 MED ORDER — PANTOPRAZOLE SODIUM 40 MG PO TBEC: 40.0000 mg | DELAYED_RELEASE_TABLET | Freq: Two times a day (BID) | ORAL | 0 refills | Status: AC

## 2019-10-04 MED ORDER — SERTRALINE HCL 50 MG PO TABS: 25.0000 mg | ORAL_TABLET | Freq: Every day | ORAL | Status: DC

## 2019-10-04 MED ORDER — ROPINIROLE HCL 2 MG PO TABS: 2.0000 mg | ORAL_TABLET | Freq: Every evening | ORAL | 0 refills | Status: AC | PRN

## 2019-10-04 MED ORDER — ALUM & MAG HYDROXIDE-SIMETH 200-200-20 MG/5ML PO SUSP
30.0000 mL | Freq: Once | ORAL | Status: AC
  Administered 2019-10-04: 30 mL via ORAL
  Filled 2019-10-04: qty 30

## 2019-10-04 MED ORDER — DULOXETINE HCL 20 MG PO CPEP: 20.0000 mg | ORAL_CAPSULE | Freq: Every day | ORAL | 0 refills | Status: DC

## 2019-10-04 MED ORDER — PANTOPRAZOLE SODIUM 40 MG PO TBEC: 40.0000 mg | DELAYED_RELEASE_TABLET | Freq: Two times a day (BID) | ORAL | Status: DC

## 2019-10-04 MED ORDER — DULOXETINE HCL 40 MG PO CPEP: 40.0000 mg | ORAL_CAPSULE | Freq: Every day | ORAL | 0 refills | Status: DC

## 2019-10-04 MED ORDER — HYDROXYZINE HCL 25 MG PO TABS: 25.0000 mg | ORAL_TABLET | Freq: Three times a day (TID) | ORAL | 0 refills | Status: DC | PRN

## 2019-10-04 MED ORDER — AMLODIPINE BESYLATE 2.5 MG PO TABS: 2.5000 mg | ORAL_TABLET | Freq: Every day | ORAL | 0 refills | Status: AC

## 2019-10-04 MED ORDER — SERTRALINE HCL 25 MG PO TABS: 25.0000 mg | ORAL_TABLET | Freq: Every day | ORAL | 2 refills | Status: DC

## 2019-10-04 MED ORDER — HYDROXYZINE HCL 25 MG PO TABS: 25.0000 mg | ORAL_TABLET | Freq: Three times a day (TID) | ORAL | Status: DC | PRN

## 2019-10-04 MED FILL — DULoxetine HCL 20 MG CPEP: 20 | 3 days supply | Qty: 4 | Fill #0

## 2019-10-04 MED FILL — hydrOXYzine HCL 25 MG TABS: 25 | 10 days supply | Qty: 30 | Fill #0

## 2019-10-04 MED FILL — AMLODIPINE BESYLATE 2.5 MG: 2.5 | 30 days supply | Qty: 30 | Fill #0

## 2019-10-04 MED FILL — rOPINIRole HCL 1 MG TABS: 1 | 30 days supply | Qty: 60 | Fill #0

## 2019-10-04 MED FILL — PANTOPRAZOLE SOD DR 40 MG T: 40 | 30 days supply | Qty: 60 | Fill #0

## 2019-10-04 MED FILL — SERTRALINE HCL 25 MG TABS: 25 | 30 days supply | Qty: 30 | Fill #0

## 2019-10-04 NOTE — Progress Notes (Signed)
Pt had been discharged with HH, PT, OT ,walker and tub bunch. Case manager advised Neoma Laming) stated that [employer] would bring equipment to the bedside. After 1 hour of waiting this RN called [facility] case manager in the ER. [place] informed for me that [employer] had already lef,t and would not be delivering equipment.  I was able to get the patient's walkers, but not tub bench. Kennon Holter advised for the patient to call in the morning, and speak to the case manager assigned to the unit about getting her tub bench. I informed miss Adamek to call [facility] charge nurse in the morning and ask to speak to case manager so she can get her tub bench.

## 2019-10-04 NOTE — Progress Notes (Signed)
[facility] Progress Note   Subjective:      Fels good, ambulating, tol diet  3.3L UOP  SCr improving, as below, K 4.0, HCO3 23  Feels ready to go home  Objective Vitals:   10/03/19 1831 10/03/19 2143 10/04/19 0538 10/04/19 0905  BP: (!) 168/94 (!) 159/93 (!) 144/82 131/82  Pulse: 86 78 85 84  Resp: 20 18  18   Temp: 98.1 F (36.7 C) 98.3 F (36.8 C) 98.7 F (37.1 C) 98.6 F (37 C)  TempSrc: Oral Oral Oral Oral  SpO2: 96% 96% 96% 98%  Weight:   86.5 kg   Height:       Physical Exam General: nontoxic Lungs: normal WOB Abdomen: urostomy with clear urine in bag, catheter noted into ostomy Extremities: no edema Neuro: conversant  Additional Objective Labs: Basic Metabolic Panel: Recent Labs  Lab 10/01/19 1142 10/01/19 1142 10/01/19 2000 10/01/19 2000 10/02/19 0413 10/03/19 1254 10/04/19 1018  NA 139   < > 137   < > 138 139 139  K 6.0*   < > 5.9*   < > 4.8 4.1 4.0  CL 104   < > 101   < > 104 103 103  CO2 18*   < > 20*   < > 18* 21* 23  GLUCOSE 140*   < > 116*   < > 128* 160* 108*  BUN 109*   < > 109*   < > 105* 92* 81*  CREATININE 14.18*   < > 13.47*   < > 12.91* 10.15* 8.78*  CALCIUM 9.7   < > 8.9   < > 9.2 9.6 9.5  PHOS 9.6*  --  8.4*  --   --  5.6*  --    < > = values in this interval not displayed.   Liver Function Tests: Recent Labs  Lab 09/30/19 2319 09/30/19 2319 10/01/19 1142 10/01/19 2000 10/03/19 1254  AST 6*  --   --   --   --   ALT 10  --   --   --   --   ALKPHOS 73  --   --   --   --   BILITOT 0.8  --   --   --   --   PROT 5.9*  --   --   --   --   ALBUMIN 2.8*   < > 3.0* 2.8* 3.6   < > = values in this interval not displayed.   No results for input(s): LIPASE, AMYLASE in the last 168 hours. CBC: Recent Labs  Lab 09/30/19 1629 09/30/19 1831 10/01/19 0554 10/01/19 0554 10/02/19 0413 10/03/19 1254 10/04/19 1018  WBC 11.0*   < > 10.1   < > 10.2 11.8* 11.7*  HGB 8.4*   < > 7.8*   < > 8.6* 10.1* 10.0*  HCT  27.7*   < > 25.6*   < > 27.9* 32.8* 33.1*  MCV 85.5  --  85.9  --  86.1 85.2 85.3  PLT 580*   < > 500*   < > 563* 682* 632*   < > = values in this interval not displayed.   Blood Culture    Component Value Date/Time   SDES URINE, CATHETERIZED 09/30/2019 2345   SPECREQUEST  09/30/2019 2345    NONE Performed at [facility], [place] [address] [place], [place] [address]    CULT MULTIPLE SPECIES PRESENT, SUGGEST RECOLLECTION (A) 09/30/2019 2345   REPTSTATUS 10/02/2019 FINAL  09/30/2019 2345    Cardiac Enzymes: No results for input(s): CKTOTAL, CKMB, CKMBINDEX, TROPONINI in the last 168 hours. CBG: Recent Labs  Lab 09/30/19 2109 10/01/19 0025 10/03/19 0959  GLUCAP 82 84 105*   Iron Studies:  Recent Labs    10/01/19 1313  IRON 18*  TIBC 246*  FERRITIN 146   @[email or web address] @ Studies/Results: MR BRAIN WO CONTRAST  Result Date: 10/03/2019 CLINICAL DATA:  Vertigo. EXAM: MRI HEAD WITHOUT CONTRAST TECHNIQUE: Multiplanar, multiecho pulse sequences of the brain and surrounding structures were obtained without intravenous contrast. COMPARISON:  04/03/2017 head CT. FINDINGS: Please note that motion artifact limits evaluation. Brain: Scattered and confluent T2/FLAIR hyperintense foci involving the periventricular and deep white matter are nonspecific however commonly associated with chronic microvascular ischemic changes. No acute infarct or intracranial hemorrhage. No midline shift, ventriculomegaly or extra-axial fluid collection. No mass lesion. Vascular: Normal flow voids. Skull and upper cervical spine: Normal marrow signal. Sinuses/Orbits: Normal orbits. Clear paranasal sinuses. No mastoid effusion. Other: None. IMPRESSION: No acute intracranial process. Moderate chronic microvascular ischemic changes. Electronically Signed   By: Primitivo Gauze M.D.   On: 10/03/2019 12:33   VAS [place] LOWER EXTREMITY VENOUS (DVT)  Result Date: 10/02/2019  Lower Venous DVTStudy Indications: Edema.   Comparison Study: no prior Performing Technologist: Abram Sander RVS  Examination Guidelines: A complete evaluation includes B-mode imaging, spectral Doppler, color Doppler, and power Doppler as needed of all accessible portions of each vessel. Bilateral testing is considered an integral part of a complete examination. Limited examinations for reoccurring indications may be performed as noted. The reflux portion of the exam is performed with the patient in reverse Trendelenburg.  +---------+---------------+---------+-----------+----------+--------------+ RIGHT    CompressibilityPhasicitySpontaneityPropertiesThrombus Aging +---------+---------------+---------+-----------+----------+--------------+ CFV      Full           Yes      Yes                                 +---------+---------------+---------+-----------+----------+--------------+ SFJ      Full                                                        +---------+---------------+---------+-----------+----------+--------------+ FV Prox  Full                                                        +---------+---------------+---------+-----------+----------+--------------+ FV Mid   Full                                                        +---------+---------------+---------+-----------+----------+--------------+ FV DistalFull                                                        +---------+---------------+---------+-----------+----------+--------------+ PFV      Full                                                        +---------+---------------+---------+-----------+----------+--------------+  POP      Full           Yes      Yes                                 +---------+---------------+---------+-----------+----------+--------------+ PTV      Full                                                        +---------+---------------+---------+-----------+----------+--------------+ PERO     Full                                                         +---------+---------------+---------+-----------+----------+--------------+   +---------+---------------+---------+-----------+----------+--------------+ LEFT     CompressibilityPhasicitySpontaneityPropertiesThrombus Aging +---------+---------------+---------+-----------+----------+--------------+ CFV      Full           Yes      Yes                                 +---------+---------------+---------+-----------+----------+--------------+ SFJ      Full                                                        +---------+---------------+---------+-----------+----------+--------------+ FV Prox  Full                                                        +---------+---------------+---------+-----------+----------+--------------+ FV Mid   Full                                                        +---------+---------------+---------+-----------+----------+--------------+ FV DistalFull                                                        +---------+---------------+---------+-----------+----------+--------------+ PFV      Full                                                        +---------+---------------+---------+-----------+----------+--------------+ POP      Full           Yes      Yes                                 +---------+---------------+---------+-----------+----------+--------------+  PTV      Full                                                        +---------+---------------+---------+-----------+----------+--------------+ PERO     Full                                                        +---------+---------------+---------+-----------+----------+--------------+     Summary: BILATERAL: - No evidence of deep vein thrombosis seen in the lower extremities, bilaterally. -No evidence of popliteal cyst, bilaterally.   *See table(s) above for measurements and observations.  Electronically signed by Curt Jews MD on 10/02/2019 at 7:35:06 PM.    Final    Medications:  . amLODipine  2.5 mg Oral Daily  . cycloSPORINE  1 drop Both Eyes BID  . [START ON 10/06/2019] DULoxetine  20 mg Oral Daily  . DULoxetine  50 mg Oral Daily  . feeding supplement (NEPRO CARB STEADY)  237 mL Oral BID BM  . levothyroxine  100 mcg Oral Q0600  . multivitamin with minerals  1 tablet Oral Daily  . pantoprazole (PROTONIX) IV  40 mg Intravenous Q12H  . sodium chloride flush  10-40 mL Intracatheter Q12H    Assessment 30F Severe AKI and Hyperkalemia with R sided urostomy, likely worsened obstruction.  1. AKI on CKD III:  07/2019 Cr 2, presented with 15+; obstructive, s/p emergent IR replacement of ureteral stent and uretral catheter on 7/12.  Recovering GFR.  Hold ARB.  OK For DC, will have labs 7/19 and f/u with me in the office.   2. Polyuria: post obstructive diuresis.  Able to hydrate orally, serum sodium ok.  No need for IVF repletion.   3. Severe hyperkalemia:  improved s/p stent replacement and with medical management; off lokelma 4. Metastatic bladder cancer s/p L nephroureterectomy and radical cystectomy with R sided ileal conduit: s/p restenting and drain placement.  Plan is to f/u with [facility] urology outpt.   In remission.  5. Metabolic Acidosis: secondary to AKI, resolved 6. Anemia:  Hb up to 10, rec IV Fe 7. HTN:   BPs ok on low dose amlodipine, will f/u as outpt 8. Hemoptysis:  CXR and VQ unrevealing. Hb stable. Hasn't recurred during admission.  Further w/u if recurs.   Rexene Agent  10/04/2019, 11:42 AM  [employer]

## 2019-10-04 NOTE — Consult Note (Signed)
[facility]iat[facility] Consult   Reason for Consult:  "medication recommendation for depressionmedication recommendation for depression" Referring Physician:  Dr Tyrell Antonio Patient Identification: Shelia Cruz MRN:  [id number] Principal Diagnosis: AKI (acute kidney injury) [facility]) Diagnosis:  Principal Problem:   AKI (acute kidney injury) ([place]) Active Problems:   Essential hypertension   Hyperkalemia   History of bladder cancer   History of left nephrectomy   History of total cystectomy   Metabolic acidosis   Hemoptysis   Hypothyroidism   Restless legs syndrome   Ureteral obstruction   Acute blood loss anemia   Total Time spent with patient: 30 minutes  Subjective: Patient states "my medications are being changed related to my kidney issues."  HPI: Shelia Cruz is a 69 y.o. female patient.  Psychiatry assessment completed by nurse practitioner.  Patient alert and oriented, answers appropriately.  Patient pleasant cooperative during assessment.  Patient reports depression related to health concerns.  Patient reports stable on Cymbalta and trazodone "for a while."  Patient reports depressed mood currently managed by primary care provider.  Patient denies outpatient psychiatric provider. Patient denies suicidal and homicidal ideations.  Patient denies history of self-harm behaviors.  Patient denies auditory and visual hallucinations.  Patient denies symptoms of paranoia. Patient reports she resides alone in [place].  Patient denies access to weapons.  Patient reports she is retired.  Patient denies alcohol and substance use.  Patient reports supportive family and friends.       Past Psychiatric History: Depression  Risk to Self:   Denies Risk to Others:   Denies Prior Inpatient Therapy:   None reported Prior Outpatient Therapy:   None reported  Past Medical History:  Past Medical History:  Diagnosis Date  . Cancer [facility])    bladder cancer  . GERD  (gastroesophageal reflux disease)   . Hypertension   . Pancreatic mass   . Renal disorder   . Thyroid disease     Past Surgical History:  Procedure Laterality Date  . ABDOMINAL HYSTERECTOMY    . BREAST BIOPSY Left    benign  . CHOLECYSTECTOMY    . CYSTOSCOPY    . IR EXT NEPHROURETERAL CATH EXCHANGE  09/30/2019  . KIDNEY SURGERY    . NASAL SEPTUM SURGERY    . REPLACEMENT TOTAL KNEE BILATERAL     Family History:  Family History  Problem Relation Age of Onset  . Hypertension Mother   . Diabetes Other    Family Psychiatric  History: None reported Social History:  Social History   Substance and Sexual Activity  Alcohol Use No     Social History   Substance and Sexual Activity  Drug Use No    Social History   Socioeconomic History  . Marital status: Single    Spouse name: Not on file  . Number of children: Not on file  . Years of education: Not on file  . Highest education level: Not on file  Occupational History  . Not on file  Tobacco Use  . Smoking status: Never Smoker  . Smokeless tobacco: Never Used  Substance and Sexual Activity  . Alcohol use: No  . Drug use: No  . Sexual activity: Not on file  Other Topics Concern  . Not on file  Social History Narrative  . Not on file   Social Determinants of Health   Financial Resource Strain:   . Difficulty of Paying Living Expenses:   Food Insecurity:   . Worried About Charity fundraiser in  the Last Year:   . [place] in the Last Year:   Transportation Needs:   . Film/video editor (Medical):   Marland Kitchen Lack of Transportation (Non-Medical):   Physical Activity:   . Days of Exercise per Week:   . Minutes of Exercise per Session:   Stress:   . Feeling of Stress :   Social Connections:   . Frequency of Communication with Friends and Family:   . Frequency of Social Gatherings with Friends and Family:   . Attends Religious Services:   . Active Member of Clubs or Organizations:   . Attends Theatre manager Meetings:   Marland Kitchen Marital Status:    Additional Social History:    Allergies:   Allergies  Allergen Reactions  . Iodinated Diagnostic Agents Other (See Comments)    Was told to avoid after being diagnosed with stage 3 kidney disease Was told to avoid after being diagnosed with stage 3 kidney disease   . Sulfamethoxazole-Trimethoprim Swelling    Facial swelling and rash  . Ciprofloxacin Nausea And Vomiting    Labs:  Results for orders placed or performed during the hospital encounter of 09/30/19 (from the past 48 hour(s))  Glucose, capillary     Status: Abnormal   Collection Time: 10/03/19  9:59 AM  Result Value Ref Range   Glucose-Capillary 105 (H) 70 - 99 mg/dL    Comment: Glucose reference range applies only to samples taken after fasting for at least 8 hours.  CBC     Status: Abnormal   Collection Time: 10/03/19 12:54 PM  Result Value Ref Range   WBC 11.8 (H) 4.0 - 10.5 K/uL   RBC 3.85 (L) 3.87 - 5.11 MIL/uL   Hemoglobin 10.1 (L) 12.0 - 15.0 g/dL   HCT 32.8 (L) 36 - 46 %   MCV 85.2 80.0 - 100.0 fL   MCH 26.2 26.0 - 34.0 pg   MCHC 30.8 30.0 - 36.0 g/dL   RDW 16.1 (H) 11.5 - 15.5 %   Platelets 682 (H) 150 - 400 K/uL   nRBC 0.0 0.0 - 0.2 %    Comment: Performed at [facility] [address] [place], [place] [address]  Renal function panel     Status: Abnormal   Collection Time: 10/03/19 12:54 PM  Result Value Ref Range   Sodium 139 135 - 145 mmol/L   Potassium 4.1 3.5 - 5.1 mmol/L   Chloride 103 98 - 111 mmol/L   CO2 21 (L) 22 - 32 mmol/L   Glucose, Bld 160 (H) 70 - 99 mg/dL    Comment: Glucose reference range applies only to samples taken after fasting for at least 8 hours.   BUN 92 (H) 8 - 23 mg/dL   Creatinine, Ser 10.15 (H) 0.44 - 1.00 mg/dL   Calcium 9.6 8.9 - 10.3 mg/dL   Phosphorus 5.6 (H) 2.5 - 4.6 mg/dL   Albumin 3.6 3.5 - 5.0 g/dL   GFR calc non Af Amer 4 (L) >60 mL/min   GFR calc Af Amer 4 (L) >60 mL/min   Anion gap 15 5 - 15     Comment: Performed at [facility] [address] [place] [address]  CBC     Status: Abnormal   Collection Time: 10/04/19 10:18 AM  Result Value Ref Range   WBC 11.7 (H) 4.0 - 10.5 K/uL   RBC 3.88 3.87 - 5.11 MIL/uL   Hemoglobin 10.0 (L) 12.0 - 15.0  g/dL   HCT 33.1 (L) 36 - 46 %   MCV 85.3 80.0 - 100.0 fL   MCH 25.8 (L) 26.0 - 34.0 pg   MCHC 30.2 30.0 - 36.0 g/dL   RDW 15.8 (H) 11.5 - 15.5 %   Platelets 632 (H) 150 - 400 K/uL   nRBC 0.0 0.0 - 0.2 %    Comment: Performed at [facility] [address] [place], [place] [address]  Basic metabolic panel     Status: Abnormal   Collection Time: 10/04/19 10:18 AM  Result Value Ref Range   Sodium 139 135 - 145 mmol/L   Potassium 4.0 3.5 - 5.1 mmol/L   Chloride 103 98 - 111 mmol/L   CO2 23 22 - 32 mmol/L   Glucose, Bld 108 (H) 70 - 99 mg/dL    Comment: Glucose reference range applies only to samples taken after fasting for at least 8 hours.   BUN 81 (H) 8 - 23 mg/dL   Creatinine, Ser 8.78 (H) 0.44 - 1.00 mg/dL   Calcium 9.5 8.9 - 10.3 mg/dL   GFR calc non Af Amer 4 (L) >60 mL/min   GFR calc Af Amer 5 (L) >60 mL/min   Anion gap 13 5 - 15    Comment: Performed at [facility] [address], [place], [place] [address]    Current Facility-Administered Medications  Medication Dose Route Frequency Provider Last Rate Last Admin  . amLODipine (NORVASC) tablet 2.5 mg  2.5 mg Oral Daily Regalado, Belkys A, MD   2.5 mg at 10/04/19 0907  . cycloSPORINE (RESTASIS) 0.05 % ophthalmic emulsion 1 drop  1 drop Both Eyes BID Chauncey Mann, MD   1 drop at 10/04/19 0924  . [START ON 10/06/2019] DULoxetine (CYMBALTA) DR capsule 20 mg  20 mg Oral Daily Regalado, Belkys A, MD      . DULoxetine (CYMBALTA) DR capsule 50 mg  50 mg Oral Daily Regalado, Belkys A, MD   50 mg at 10/04/19 0908  . feeding supplement (NEPRO CARB STEADY) liquid 237 mL  237 mL Oral BID BM Domenic Polite, MD   237 mL at 10/03/19 1419  . fentaNYL  (SUBLIMAZE) injection 50 mcg  50 mcg Intravenous Q2H PRN Chauncey Mann, MD   50 mcg at 10/03/19 1620  . levothyroxine (SYNTHROID) tablet 100 mcg  100 mcg Oral Q0600 Chauncey Mann, MD   100 mcg at 10/04/19 0533  . multivitamin with minerals tablet 1 tablet  1 tablet Oral Daily Domenic Polite, MD   1 tablet at 10/03/19 [phone]  . ondansetron (ZOFRAN) injection 4 mg  4 mg Intravenous Q6H PRN Chauncey Mann, MD   4 mg at 10/04/19 0918  . pantoprazole (PROTONIX) injection 40 mg  40 mg Intravenous Q12H Chauncey Mann, MD   40 mg at 10/04/19 0907  . rOPINIRole (REQUIP) tablet 2 mg  2 mg Oral QHS PRN Chauncey Mann, MD   2 mg at 10/03/19 2122  . sodium chloride flush (NS) 0.9 % injection 10-40 mL  10-40 mL Intracatheter Q12H Domenic Polite, MD      . sodium chloride flush (NS) 0.9 % injection 10-40 mL  10-40 mL Intracatheter PRN Domenic Polite, MD      . traZODone (DESYREL) tablet 150 mg  150 mg Oral QHS PRN Chauncey Mann, MD   150 mg at 10/03/19 2129    Musculoskeletal: Strength & Muscle Tone: within normal limits Gait & Station:  unable to assess Patient leans: N/A  Psychiatric Specialty Exam: Physical Exam Vitals and nursing note reviewed.  Constitutional:      Appearance: She is well-developed.  HENT:     Head: Normocephalic.  Cardiovascular:     Rate and Rhythm: Normal rate.  Pulmonary:     Effort: Pulmonary effort is normal.  Neurological:     Mental Status: She is alert and oriented to person, place, and time.  Psychiatric:        Attention and Perception: Attention and perception normal.        Mood and Affect: Mood and affect normal.        Speech: Speech normal.        Behavior: Behavior normal. Behavior is cooperative.        Thought Content: Thought content normal.        Cognition and Memory: Cognition and memory normal.        Judgment: Judgment normal.     Review of Systems  Constitutional: Negative.   HENT: Negative.   Eyes: Negative.   Respiratory:  Negative.   Cardiovascular: Negative.   Gastrointestinal: Negative.   Genitourinary: Negative.   Musculoskeletal: Negative.   Skin: Negative.   Neurological: Negative.   Psychiatric/Behavioral: Negative.     Blood pressure 131/82, pulse 84, temperature 98.6 F (37 C), temperature source Oral, resp. rate 18, height 5\' 6"  (1.676 m), weight 86.5 kg, SpO2 98 %.Body mass index is 30.78 kg/m.  General Appearance: Casual and Fairly Groomed  Eye Contact:  Good  Speech:  Clear and Coherent and Normal Rate  Volume:  Normal  Mood:  Euthymic  Affect:  Appropriate and Congruent  Thought Process:  Coherent, Goal Directed and Descriptions of Associations: Intact  Orientation:  Full (Time, Place, and Person)  Thought Content:  WDL and Logical  Suicidal Thoughts:  No  Homicidal Thoughts:  No  Memory:  Immediate;   Good Recent;   Good Remote;   Good  Judgement:  Good  Insight:  Good  Psychomotor Activity:  Normal  Concentration:  Concentration: Good and Attention Span: Good  Recall:  Good  Fund of Knowledge:  Good  Language:  Good  Akathisia:  No  Handed:  Right  AIMS (if indicated):     Assets:  Communication Skills Desire for Improvement Financial Resources/Insurance Housing Intimacy Resilience Social Support  ADL's:  Intact  Cognition:  WNL  Sleep:        Treatment Plan Summary: Patient reviewed with Dr. Hampton Abbot. Medication management  Recommendations:  -Recommend discontinue trazodone and continue to taper to discontinue Cymbalta. -Recommend Zoloft 25 mg by mouth daily/depression -Recommend Vistaril 25 mg by mouth 2 times daily as needed -Recommend Vistaril 25 mg by mouth nightly as needed   Disposition: No evidence of imminent risk to self or others at present.   Patient does not meet criteria for psychiatric inpatient admission. Supportive therapy provided about ongoing stressors.  Emmaline Kluver, FNP 10/04/2019 12:40 PM

## 2019-10-04 NOTE — Progress Notes (Signed)
NURSING PROGRESS NOTE  Shelia Cruz [id number] Discharge Data: 10/04/2019 6:32 PM Attending Provider: Elmarie Shiley, MD [id number], Lattie Haw, MD     [place] to be D/C'd Home per MD order.  Discussed with the patient the After Visit Summary and all questions fully answered. All IV's discontinued with no bleeding noted. All belongings returned to patient for patient to take home.   Last Vital Signs:  Blood pressure 131/82, pulse 84, temperature 98.6 F (37 C), temperature source Oral, resp. rate 18, height 5\' 6"  (1.676 m), weight 86.5 kg, SpO2 98 %.  Discharge Medication List Allergies as of 10/04/2019      Reactions   Iodinated Diagnostic Agents Other (See Comments)   Was told to avoid after being diagnosed with stage 3 kidney disease Was told to avoid after being diagnosed with stage 3 kidney disease   Sulfamethoxazole-trimethoprim Swelling   Facial swelling and rash   Ciprofloxacin Nausea And Vomiting      Medication List    STOP taking these medications   losartan 50 MG tablet Commonly known as: COZAAR   traZODone 150 MG tablet Commonly known as: DESYREL     TAKE these medications   Align 4 MG Caps Take 1 capsule by mouth daily. Notes to patient: 10/05/2019   amLODipine 2.5 MG tablet Commonly known as: NORVASC Take 1 tablet (2.5 mg total) by mouth daily. Start taking on: October 05, 2019   aspirin EC 81 MG tablet Take 81 mg by mouth daily. Notes to patient: 10/05/2019   bimatoprost 0.01 % Soln Commonly known as: LUMIGAN Place 1 drop into both eyes at bedtime. Notes to patient: 10/04/2019   calcitRIOL 0.5 MCG capsule Commonly known as: ROCALTROL Take 0.5 mcg by mouth 3 (three) times a week. Monday, Wednesday and Friday Notes to patient: 10/04/2019   cycloSPORINE 0.05 % ophthalmic emulsion Commonly known as: RESTASIS Place 1 drop into both eyes 2 (two) times daily. Notes to patient: 10/04/2019   DULoxetine HCl 40 MG Cpep Take 40 mg by mouth daily  for 1 day. Start taking on: October 05, 2019 What changed:   medication strength  how much to take  when to take this   DULoxetine 20 MG capsule Commonly known as: CYMBALTA Take 1 capsule (20 mg total) by mouth daily for 2 days. Start taking on: October 06, 2019 What changed: You were already taking a medication with the same name, and this prescription was added. Make sure you understand how and when to take each.   fluticasone 50 MCG/ACT nasal spray Commonly known as: FLONASE Place 1 spray into the nose daily. Notes to patient: 10/05/2019   hydrOXYzine 25 MG tablet Commonly known as: ATARAX/VISTARIL Take 1 tablet (25 mg total) by mouth 3 (three) times daily as needed for anxiety.   levothyroxine 100 MCG tablet Commonly known as: SYNTHROID Take 100 mcg by mouth daily before breakfast. Notes to patient: 10/05/2019   multivitamin capsule Take 1 capsule by mouth daily. Notes to patient: 10/05/2019   ondansetron 4 MG tablet Commonly known as: ZOFRAN Take 4 mg by mouth every 4 (four) hours as needed.   pantoprazole 40 MG tablet Commonly known as: PROTONIX Take 1 tablet (40 mg total) by mouth 2 (two) times daily. Notes to patient: 10/04/2019   rOPINIRole 2 MG tablet Commonly known as: REQUIP Take 1 tablet (2 mg total) by mouth at bedtime as needed (restless legs). What changed:   how much to take  when to take this  reasons  to take this   sertraline 25 MG tablet Commonly known as: ZOLOFT Take 1 tablet (25 mg total) by mouth daily. Start taking on: October 09, 2019   Vitamin D3 50 MCG (2000 UT) capsule Take 2,000 Units by mouth 4 (four) times a week. Tuesday, Thursday, Saturday and Sunday Notes to patient: 10/05/2019            Durable Medical Equipment  (From admission, onward)         Start     Ordered   10/03/19 1544  For home use only DME Walker  Once       Question:  Patient needs a walker to treat with the following condition  Answer:  Balance disorder    10/03/19 1543   10/03/19 1544  For home use only DME Tub bench  Once        10/03/19 1543   10/03/19 1543  For home use only DME 3 n 1  Once        07 /15/21 1542   10/03/19 1543  For home use only DME Bedside commode  Once       Question:  Patient needs a bedside commode to treat with the following condition  Answer:  Balance problem   10/03/19 1542

## 2019-10-04 NOTE — TOC Transition Note (Addendum)
Transition of Care [facility]) - CM/SW Discharge Note   Patient Details  Name: Shelia Cruz MRN: [id number] Date of Birth: [birth year]  [facility] CM/SW Contact:  Zenon Mayo, RN Phone Number: 10/04/2019, 4:45 PM   Clinical Narrative:    Patient is for dc today, NCM asked if she would like [place] set up she states yes and she does not have a preference of the agency, NCM made referral to [facility] with [facility], she is able to take referral for [facility], [place], [place].  Soc will begin 24 to 48 hrs post dc.  She also needs tub bench and rolling walker.  NCM made referral to [facility] with [facility], this will be brought up to room prior to dc. Patient states she has PCP which is listed.  Final next level of care: [place] Barriers to Discharge: No Barriers Identified   Patient Goals and CMS Choice Patient states their goals for this hospitalization and ongoing recovery are:: Patient's plans to discarge home once she is medically stable CMS Medicare.gov Compare Post Acute Care list provided to:: Patient Choice offered to / list presented to : Patient  Discharge Placement                       Discharge Plan and Services In-house Referral: Clinical Social Work   Post Acute Care Choice: Home Health (Will follow-up wtih patient regarding home health)          DME Arranged: Gilford Rile rolling, Tub bench DME Agency: [facility] Date DME Agency Contacted: 10/04/19 Time DME Agency Contacted: [phone] Representative spoke with at DME Agency: Thedore Mins HH Arranged: PT, OT, Nurse's Aide [place] Agency: [place] Date [place]: 10/04/19 Time [place]: 1642 Representative spoke with at [place]: [place] ([place]) Interventions     Readmission Risk Interventions No flowsheet data found.

## 2019-10-04 NOTE — Progress Notes (Signed)
Physical Therapy Treatment Patient Details Name: Shelia Cruz MRN: [id number] DOB: 06-Oct-1950 Today's Date: 10/04/2019    History of Present Illness Shelia Cruz is a 69 y.o. female with PMH of metastatic bladder cancer s/p left nephroureterectomy and radical cystectomy with ileal conduit and recent stent placement for ureteral obstruction as well as HTN and hypothyroidism presented to ED with weakness and hemoptysis and admitted for acute renal failure, hyperkalemia and hydroureteronephrosis.    PT Comments    Pt making excellent progress with functional mobility. She denied any dizziness this session. She was able to progress to hallway ambulation with RW and supervision for safety. No LOB or need for physical assistance throughout. Pt would continue to benefit from skilled physical therapy services at this time while admitted and after d/c to address the below listed limitations in order to improve overall safety and independence with functional mobility.    Follow Up Recommendations  Home health PT     Equipment Recommendations  Rolling walker with 5" wheels;3in1 (PT)    Recommendations for Other Services       Precautions / Restrictions Precautions Precautions: Fall Precaution Comments: urostomy  Restrictions Weight Bearing Restrictions: No    Mobility  Bed Mobility Overal bed mobility: Needs Assistance Bed Mobility: Supine to Sit     Supine to sit: Supervision     General bed mobility comments: HOB elevated  Transfers Overall transfer level: Needs assistance Equipment used: Rolling walker (2 wheeled) Transfers: Sit to/from Stand Sit to Stand: Supervision         General transfer comment: good technique utilized, supervision for safety  Ambulation/Gait Ambulation/Gait assistance: Supervision Gait Distance (Feet): 100 Feet Assistive device: Rolling walker (2 wheeled) Gait Pattern/deviations: Step-through pattern;Decreased stride length Gait  velocity: reduced   General Gait Details: pt with slow, cautious gait but overall steady with use of RW; able to navigate around obstacles in hallway without difficulties.    Stairs             Wheelchair Mobility    Modified Rankin (Stroke Patients Only)       Balance Overall balance assessment: Needs assistance Sitting-balance support: Feet supported Sitting balance-Leahy Scale: Good     Standing balance support: Bilateral upper extremity supported;During functional activity Standing balance-Leahy Scale: Poor                              Cognition Arousal/Alertness: Awake/alert Behavior During Therapy: WFL for tasks assessed/performed Overall Cognitive Status: Within Functional Limits for tasks assessed                                        Exercises      General Comments        Pertinent Vitals/Pain Pain Assessment: No/denies pain    Home Living                      Prior Function            PT Goals (current goals can now be found in the care plan section) Acute Rehab PT Goals PT Goal Formulation: With patient Time For Goal Achievement: 10/15/19 Potential to Achieve Goals: Good Progress towards PT goals: Progressing toward goals    Frequency    Min 3X/week      PT Plan Current plan remains appropriate    Co-evaluation  AM-PAC PT "6 Clicks" Mobility   Outcome Measure  Help needed turning from your back to your side while in a flat bed without using bedrails?: None Help needed moving from lying on your back to sitting on the side of a flat bed without using bedrails?: None Help needed moving to and from a bed to a chair (including a wheelchair)?: None Help needed standing up from a chair using your arms (e.g., wheelchair or bedside chair)?: None Help needed to walk in hospital room?: None Help needed climbing 3-5 steps with a railing? : A Little 6 Click Score: 23    End of  Session   Activity Tolerance: Patient tolerated treatment well Patient left: in chair;with call bell/phone within reach Nurse Communication: Mobility status PT Visit Diagnosis: Other abnormalities of gait and mobility (R26.89);Muscle weakness (generalized) (M62.81)     Time: 1050-1108 PT Time Calculation (min) (ACUTE ONLY): 18 min  Charges:  $Gait Training: 8-22 mins                     Anastasio Champion, DPT  Acute Rehabilitation Services Pager [phone] Office [place] 10/04/2019, 11:46 AM

## 2019-10-04 NOTE — Discharge Summary (Signed)
Physician Discharge Summary  Shelia Cruz KKX:[id number] DOB: 07/13/1950 DOA: 09/30/2019  PCP: Kathyrn Lass, MD  Admit date: 09/30/2019 Discharge date: 10/04/2019  Admitted From: Home Disposition: Home   Recommendations for Outpatient Follow-up:  1. Follow up with PCP in 1-2 weeks 2. Please obtain BMP/CBC in one week 3. Follow up with nephrology for further care of renal failure  Home Health: yes.   Discharge Condition: Stable.  CODE STATUS: Full code Diet recommendation: Heart Healthy   Brief/Interim Summary: 69 year old with past medical history significant for metastatic bladder cancer status post left nephroureterectomy and radical cystectomy with ilea conduit and recent stent placement for ureteral obstruction as well as HTN and hypothyroidism presented to ED with weakness and hemoptysis and admitted for acute renal failure, hyperkalemia and hydroureteronephrosis.  Patient has been having multiple symptoms since May notably right side flank pain, decreased urine output, abdominal pain, intermittent hemoptysis and dark stool off and on. -Patient follows with [facility] for urology/oncology and was planning to see nephrology care this Wednesday.  In the ED: Vitals stable, labs remarkable for sodium 134, potassium 7.3, CO2 16 creatinine 15 GFR of 2.  CT abdomen and pelvis: Obstructive uropathy within the right kidney and right ureter of uncertain etiology.  This could be due in part to urinary diversion in the right lower quadrant.  Postsurgical changes from a left nephro rhytidectomy and cystectomy.  Diverticulosis without diverticulitis.  Hyperkalemia treated with insulin, sodium bicarb, Lokelma, calcium gluconate.  IV fluids.  Urology renal and IR consulted.  Urology recommended urgent right nephrostomy tube placement with IR.  Interventional radiology noted malposition right nephroureteral catheter, underwent successful fluoroscopic exchange, repositioning and upsizing of 12  French right side nephroureteral catheter via ileal conduit with and in the renal pelvis.   1-Acute renal failure on CKD stage IV Hydroureteronephrosis, metastatic bladder cancer Hyperkalemia, metabolic acidosis -Creatinine baseline to 2.5--2.8.,  Patient was admitted with a creatinine of 15/17 BUN of 115, potassium 7.3 -Worsening renal function probably related to malposition at right nephroureteral catheter, and ongoing ARB use -Underwent emergent exchange and upsizing of the right nephroureteral catheter by IR.  -Hyperkalemia improved.  Urine output: 3.5 L last 24-hour -Nephrology following no indication for dialysis as of yet Improving. close follow up with nephrology   2-Normocytic anemia, history of metastatic bladder cancer -History of intermittent hemoptysis and melena -Continue with PPI, VQ scan and Dopplers negative negative -Work-up with oncology and neurology at [facility] -She will receive 2 doses of IV iron Hb stable.   Hypertension: Continue to hold losartan. Started low dose norvasc.  Hypothyroidism: Continue with Synthroid.  Restless leg, MDD, chronic pain Requip dose reduced to 2 mg.  Depression; Cymbalta on Hold. She is depress, anxious. Psych consulted for medications recommendations due to inability to resume Cymbalta due to renal failure Psych recommend taper dose of Cymbalta. Plan to stop trazodone. Start Zoloft and atarax.   Dizziness; Resolved Check EKG; showed sinus rhythm.  cbg normal.  MRI negative for acute stroke.    Discharge Diagnoses:  Principal Problem:   AKI (acute kidney injury) ([place]) Active Problems:   Essential hypertension   Hyperkalemia   History of bladder cancer   History of left nephrectomy   History of total cystectomy   Metabolic acidosis   Hemoptysis   Hypothyroidism   Restless legs syndrome   Ureteral obstruction   Acute blood loss anemia    Discharge Instructions  Discharge Instructions    Diet - low sodium  heart healthy  Complete by: As directed    Increase activity slowly   Complete by: As directed      Allergies as of 10/04/2019      Reactions   Iodinated Diagnostic Agents Other (See Comments)   Was told to avoid after being diagnosed with stage 3 kidney disease Was told to avoid after being diagnosed with stage 3 kidney disease   Sulfamethoxazole-trimethoprim Swelling   Facial swelling and rash   Ciprofloxacin Nausea And Vomiting      Medication List    STOP taking these medications   losartan 50 MG tablet Commonly known as: COZAAR   traZODone 150 MG tablet Commonly known as: DESYREL     TAKE these medications   Align 4 MG Caps Take 1 capsule by mouth daily.   amLODipine 2.5 MG tablet Commonly known as: NORVASC Take 1 tablet (2.5 mg total) by mouth daily. Start taking on: October 05, 2019   aspirin EC 81 MG tablet Take 81 mg by mouth daily.   bimatoprost 0.01 % Soln Commonly known as: LUMIGAN Place 1 drop into both eyes at bedtime.   calcitRIOL 0.5 MCG capsule Commonly known as: ROCALTROL Take 0.5 mcg by mouth 3 (three) times a week. Monday, Wednesday and Friday   cycloSPORINE 0.05 % ophthalmic emulsion Commonly known as: RESTASIS Place 1 drop into both eyes 2 (two) times daily.   DULoxetine HCl 40 MG Cpep Take 40 mg by mouth daily for 1 day. Start taking on: October 05, 2019 What changed:   medication strength  how much to take  when to take this   DULoxetine 20 MG capsule Commonly known as: CYMBALTA Take 1 capsule (20 mg total) by mouth daily for 2 days. Start taking on: October 06, 2019 What changed: You were already taking a medication with the same name, and this prescription was added. Make sure you understand how and when to take each.   fluticasone 50 MCG/ACT nasal spray Commonly known as: FLONASE Place 1 spray into the nose daily.   hydrOXYzine 25 MG tablet Commonly known as: ATARAX/VISTARIL Take 1 tablet (25 mg total) by mouth 3 (three) times  daily as needed for anxiety.   levothyroxine 100 MCG tablet Commonly known as: SYNTHROID Take 100 mcg by mouth daily before breakfast.   multivitamin capsule Take 1 capsule by mouth daily.   ondansetron 4 MG tablet Commonly known as: ZOFRAN Take 4 mg by mouth every 4 (four) hours as needed.   pantoprazole 40 MG tablet Commonly known as: PROTONIX Take 1 tablet (40 mg total) by mouth 2 (two) times daily.   rOPINIRole 2 MG tablet Commonly known as: REQUIP Take 1 tablet (2 mg total) by mouth at bedtime as needed (restless legs). What changed:   how much to take  when to take this  reasons to take this   sertraline 25 MG tablet Commonly known as: ZOLOFT Take 1 tablet (25 mg total) by mouth daily. Start taking on: October 09, 2019   Vitamin D3 50 MCG (2000 UT) capsule Take 2,000 Units by mouth 4 (four) times a week. Tuesday, Thursday, Saturday and Sunday            Durable Medical Equipment  (From admission, onward)         Start     Ordered   10/03/19 1544  For home use only DME Walker  Once       Question:  Patient needs a walker to treat with the following condition  Answer:  Balance disorder   10/03/19 1543   10/03/19 1544  For home use only DME Tub bench  Once        10/03/19 1543   10/03/19 1543  For home use only DME 3 n 1  Once        10/03/19 1542   10/03/19 1543  For home use only DME Bedside commode  Once       Question:  Patient needs a bedside commode to treat with the following condition  Answer:  Balance problem   10/03/19 1542          Follow-up Information    Kathyrn Lass, MD Follow up in 1 week(s).   Specialty: Family Medicine Contact information: [place] [address] [phone]        Rexene Agent, MD Follow up in 1 week(s).   Specialty: Nephrology Why: need labs 10-07-2019 Contact information: [place] [address] [phone]              Allergies  Allergen Reactions  .  Iodinated Diagnostic Agents Other (See Comments)    Was told to avoid after being diagnosed with stage 3 kidney disease Was told to avoid after being diagnosed with stage 3 kidney disease   . Sulfamethoxazole-Trimethoprim Swelling    Facial swelling and rash  . Ciprofloxacin Nausea And Vomiting    Consultations:  Nephrology    Procedures/Studies: CT Abdomen Pelvis Wo Contrast  Result Date: 09/30/2019 CLINICAL DATA:  Generalized weakness, abdominal pain, hemoptysis, history of bladder cancer EXAM: CT ABDOMEN AND PELVIS WITHOUT CONTRAST TECHNIQUE: Multidetector CT imaging of the abdomen and pelvis was performed following the standard protocol without IV contrast. COMPARISON:  04/08/2017 FINDINGS: Lower chest: No acute pleural or parenchymal lung disease. Hepatobiliary: No focal liver abnormality is seen. Status post cholecystectomy. No biliary dilatation. Pancreas: Unremarkable. No pancreatic ductal dilatation or surrounding inflammatory changes. Spleen: Normal in size without focal abnormality. Adrenals/Urinary Tract: Postsurgical changes are seen from left nephro ureterectomy and cystectomy. There is significant distension of the right renal collecting system and right ureter, which may be related to diverting urostomy. There is a drainage catheter coiled within the urinary diversion right lower quadrant. Stomach/Bowel: No bowel obstruction or ileus. Diverticulosis of the descending and sigmoid colon without diverticulitis. No bowel wall thickening or inflammatory change. Vascular/Lymphatic: Aortic atherosclerosis. No enlarged abdominal or pelvic lymph nodes. Reproductive: Status post hysterectomy. No adnexal masses. Other: No free fluid or free gas. Small fat containing left inguinal hernia unchanged. Musculoskeletal: No acute or destructive bony lesions. Reconstructed images demonstrate no additional findings. IMPRESSION: 1. Obstructive uropathy within the right kidney and right ureter, of  uncertain etiology. This could be due, in part, to the urinary diversion in the right lower quadrant. 2. Postsurgical changes from a left nephro ureterectomy and cystectomy. 3. Diverticulosis without diverticulitis. Electronically Signed   By: Randa Ngo M.D.   On: 09/30/2019 18:50   MR BRAIN WO CONTRAST  Result Date: 10/03/2019 CLINICAL DATA:  Vertigo. EXAM: MRI HEAD WITHOUT CONTRAST TECHNIQUE: Multiplanar, multiecho pulse sequences of the brain and surrounding structures were obtained without intravenous contrast. COMPARISON:  04/03/2017 head CT. FINDINGS: Please note that motion artifact limits evaluation. Brain: Scattered and confluent T2/FLAIR hyperintense foci involving the periventricular and deep white matter are nonspecific however commonly associated with chronic microvascular ischemic changes. No acute infarct or intracranial hemorrhage. No midline shift, ventriculomegaly or extra-axial fluid collection. No mass lesion. Vascular: Normal flow voids. Skull and upper  cervical spine: Normal marrow signal. Sinuses/Orbits: Normal orbits. Clear paranasal sinuses. No mastoid effusion. Other: None. IMPRESSION: No acute intracranial process. Moderate chronic microvascular ischemic changes. Electronically Signed   By: Primitivo Gauze M.D.   On: 10/03/2019 12:33   NM Pulmonary Perfusion  Result Date: 10/01/2019 CLINICAL DATA:  Dyspnea on exertion.  Hemoptysis EXAM: NUCLEAR MEDICINE PERFUSION LUNG SCAN TECHNIQUE: Perfusion images were obtained in multiple projections after intravenous injection of radiopharmaceutical. Ventilation scans intentionally deferred if perfusion scan and chest x-ray adequate for interpretation during COVID 19 epidemic. RADIOPHARMACEUTICALS:  4.2 mCi Tc-54m MAA IV COMPARISON:  Chest radiograph 09/30/2019 FINDINGS: No wedge-shaped peripheral perfusion defect within LEFT or RIGHT lung to suggest acute pulmonary emboli. Large cardiac defect noted. IMPRESSION: No evidence acute  pulmonary embolism. Electronically Signed   By: Suzy Bouchard M.D.   On: 10/01/2019 15:59   DG Chest Port 1 View  Result Date: 09/30/2019 CLINICAL DATA:  Hemoptysis EXAM: PORTABLE CHEST 1 VIEW COMPARISON:  None. FINDINGS: There is mild cardiomegaly. Both lungs are clear. No large airspace consolidation or pleural effusion. The visualized skeletal structures are unremarkable. IMPRESSION: No acute cardiopulmonary process Electronically Signed   By: Prudencio Pair M.D.   On: 09/30/2019 18:20   IR EXT NEPHROURETERAL CATH EXCHANGE  Result Date: 10/01/2019 INDICATION: History of bladder cancer post cystectomy and left-sided nephro ureterectomy now with ileal conduit creation and right-sided retrograde ureteral stent placement (all of which was performed at an outside institution), now presenting with renal failure. Preceding noncontrast CT scan abdomen and pelvis demonstrates retraction of the right-sided retrograde ureteral catheter to the level of the distal ureteral-enteric anastomosis. As such, patient presents now for fluoroscopic guided ureteral stent exchange/repositioning and/or nephrostomy catheter placement. EXAM: FLUOROSCOPIC GUIDED RIGHT SIDED NEPHROURETERAL CATHETER EXCHANGE COMPARISON:  CT abdomen pelvis-earlier same day CONTRAST:  15 mL Isovue-300 administered into the collecting system FLUOROSCOPY TIME:  4 minutes, 24 seconds (12.4 mGy) COMPLICATIONS: None immediate. TECHNIQUE: Informed written consent was obtained from the patient after a discussion of the risks, benefits and alternatives to treatment. Questions regarding the procedure were encouraged and answered. A timeout was performed prior to the initiation of the procedure. External portion of the existing retrograde nephroureteral catheter as well as the surrounding skin was draped in the usual sterile fashion. A sterile drape was applied covering the operative field. Maximum barrier sterile technique with sterile gowns and gloves were  used for the procedure. A timeout was performed prior to the initiation of the procedure. A pre procedural spot fluoroscopic image was obtained after contrast was injected via the existing 8 French nephroureteral catheter confirming malpositioning of the nephroureteral catheter located at the level of the distal ureteral enteric anastomosis with faint opacification of the distal aspect the right ureter. The external portion of the catheter was cut, however despite prolonged efforts, the wire could not be advanced through the entirety of the catheter due to the catheter's incrustation. As such, the nephroureteral catheter was removed and with the use of a regular glidewire, a Kumpe catheter was advanced to the level of the mid aspect of the right ureter. Contrast injection confirmed appropriate positioning Over a Amplatz wire, a new, slightly larger now 12 French, 45 cm all-purpose drainage catheter was placed with distal end ultimately coiled and locked within the right renal pelvis. Contrast injection confirmed appropriate positioning within the renal pelvis and a post exchange fluoroscopic image was obtained. The catheter was locked and the patient's urostomy was reapplied. The patient tolerated the procedure well  without immediate postprocedural complication. FINDINGS: The existing nephroureteral catheter is malpositioned located at the level of the distal ureteral anastomosis. Additionally, the nephroureteral catheter is nearly completely occluded with encrusted debris. After successful fluoroscopic guided exchange, the new, slightly larger now 12 French nephroureteral catheter is appropriately positioned with end coiled and locked within the right renal pelvis. IMPRESSION: Successful fluoroscopic guided exchange, repositioning and up sizing of now 12 French, 45 cm right-sided nephroureteral catheter. Electronically Signed   By: Sandi Mariscal M.D.   On: 10/01/2019 08:08   VAS [place] LOWER EXTREMITY VENOUS  (DVT)  Result Date: 10/02/2019  Lower Venous DVTStudy Indications: Edema.  Comparison Study: no prior Performing Technologist: Abram Sander RVS  Examination Guidelines: A complete evaluation includes B-mode imaging, spectral Doppler, color Doppler, and power Doppler as needed of all accessible portions of each vessel. Bilateral testing is considered an integral part of a complete examination. Limited examinations for reoccurring indications may be performed as noted. The reflux portion of the exam is performed with the patient in reverse Trendelenburg.  +---------+---------------+---------+-----------+----------+--------------+ RIGHT    CompressibilityPhasicitySpontaneityPropertiesThrombus Aging +---------+---------------+---------+-----------+----------+--------------+ CFV      Full           Yes      Yes                                 +---------+---------------+---------+-----------+----------+--------------+ SFJ      Full                                                        +---------+---------------+---------+-----------+----------+--------------+ FV Prox  Full                                                        +---------+---------------+---------+-----------+----------+--------------+ FV Mid   Full                                                        +---------+---------------+---------+-----------+----------+--------------+ FV DistalFull                                                        +---------+---------------+---------+-----------+----------+--------------+ PFV      Full                                                        +---------+---------------+---------+-----------+----------+--------------+ POP      Full           Yes      Yes                                 +---------+---------------+---------+-----------+----------+--------------+  PTV      Full                                                         +---------+---------------+---------+-----------+----------+--------------+ PERO     Full                                                        +---------+---------------+---------+-----------+----------+--------------+   +---------+---------------+---------+-----------+----------+--------------+ LEFT     CompressibilityPhasicitySpontaneityPropertiesThrombus Aging +---------+---------------+---------+-----------+----------+--------------+ CFV      Full           Yes      Yes                                 +---------+---------------+---------+-----------+----------+--------------+ SFJ      Full                                                        +---------+---------------+---------+-----------+----------+--------------+ FV Prox  Full                                                        +---------+---------------+---------+-----------+----------+--------------+ FV Mid   Full                                                        +---------+---------------+---------+-----------+----------+--------------+ FV DistalFull                                                        +---------+---------------+---------+-----------+----------+--------------+ PFV      Full                                                        +---------+---------------+---------+-----------+----------+--------------+ POP      Full           Yes      Yes                                 +---------+---------------+---------+-----------+----------+--------------+ PTV      Full                                                        +---------+---------------+---------+-----------+----------+--------------+  PERO     Full                                                        +---------+---------------+---------+-----------+----------+--------------+     Summary: BILATERAL: - No evidence of deep vein thrombosis seen in the lower extremities, bilaterally. -No evidence of  popliteal cyst, bilaterally.   *See table(s) above for measurements and observations. Electronically signed by Curt Jews MD on 10/02/2019 at 7:35:06 PM.    Final      Subjective: She is feeling better. Denies dizziness, headaches.  Vomited bacon, but feeling well  Discharge Exam: Vitals:   10/04/19 0538 10/04/19 0905  BP: (!) 144/82 131/82  Pulse: 85 84  Resp:  18  Temp: 98.7 F (37.1 C) 98.6 F (37 C)  SpO2: 96% 98%     General: Pt is alert, awake, not in acute distress Cardiovascular: RRR, S1/S2 +, no rubs, no gallops Respiratory: CTA bilaterally, no wheezing, no rhonchi Abdominal: Soft, NT, ND, bowel sounds + Extremities: no edema, no cyanosis    The results of significant diagnostics from this hospitalization (including imaging, microbiology, ancillary and laboratory) are listed below for reference.     Microbiology: Recent Results (from the past 240 hour(s))  SARS Coronavirus 2 by RT PCR (hospital order, performed in [facility] hospital lab) Nasopharyngeal Nasopharyngeal Swab     Status: None   Collection Time: 09/30/19  8:02 PM   Specimen: Nasopharyngeal Swab  Result Value Ref Range Status   SARS Coronavirus 2 NEGATIVE NEGATIVE Final    Comment: (NOTE) SARS-CoV-2 target nucleic acids are NOT DETECTED.  The SARS-CoV-2 RNA is generally detectable in upper and lower respiratory specimens during the acute phase of infection. The lowest concentration of SARS-CoV-2 viral copies this assay can detect is 250 copies / mL. A negative result does not preclude SARS-CoV-2 infection and should not be used as the sole basis for treatment or other patient management decisions.  A negative result may occur with improper specimen collection / handling, submission of specimen other than nasopharyngeal swab, presence of viral mutation(s) within the areas targeted by this assay, and inadequate number of viral copies (<250 copies / mL). A negative result must be combined with  clinical observations, patient history, and epidemiological information.  Fact Sheet for Patients:   StrictlyIdeas.no  Fact Sheet for Healthcare Providers: BankingDealers.co.za  This test is not yet approved or  cleared by the [place] FDA and has been authorized for detection and/or diagnosis of SARS-CoV-2 by FDA under an Emergency Use Authorization (EUA).  This EUA will remain in effect (meaning this test can be used) for the duration of the COVID-19 declaration under Section 564(b)(1) of the Act, 21 U.S.C. section 360bbb-3(b)(1), unless the authorization is terminated or revoked sooner.  Performed at [facility], [place] [address], [place], [place] [address]   Culture, Urine     Status: Abnormal   Collection Time: 09/30/19 11:45 PM   Specimen: Urine, Catheterized  Result Value Ref Range Status   Specimen Description URINE, CATHETERIZED  Final   Special Requests   Final    NONE Performed at [facility], [place] [address], [place],  [address]    Culture MULTIPLE SPECIES PRESENT, SUGGEST RECOLLECTION (A)  Final   Report Status 10/02/2019 FINAL  Final  Labs: BNP (last 3 results) No results for input(s): BNP in the last 8760 hours. Basic Metabolic Panel: Recent Labs  Lab 09/30/19 2319 10/01/19 0219 10/01/19 1142 10/01/19 2000 10/02/19 0413 10/03/19 1254 10/04/19 1018  NA 138   < > 139 137 138 139 139  K 6.4*   < > 6.0* 5.9* 4.8 4.1 4.0  CL 105   < > 104 101 104 103 103  CO2 18*   < > 18* 20* 18* 21* 23  GLUCOSE 103*   < > 140* 116* 128* 160* 108*  BUN 113*   < > 109* 109* 105* 92* 81*  CREATININE 15.13*   < > 14.18* 13.47* 12.91* 10.15* 8.78*  CALCIUM 9.0   < > 9.7 8.9 9.2 9.6 9.5  MG 2.2  --   --   --   --   --   --   PHOS  --   --  9.6* 8.4*  --  5.6*  --    < > = values in this interval not displayed.   Liver Function Tests: Recent Labs  Lab 09/30/19 2319 10/01/19 1142 10/01/19 2000  10/03/19 1254  AST 6*  --   --   --   ALT 10  --   --   --   ALKPHOS 73  --   --   --   BILITOT 0.8  --   --   --   PROT 5.9*  --   --   --   ALBUMIN 2.8* 3.0* 2.8* 3.6   No results for input(s): LIPASE, AMYLASE in the last 168 hours. No results for input(s): AMMONIA in the last 168 hours. CBC: Recent Labs  Lab 09/30/19 1629 09/30/19 1629 09/30/19 1831 10/01/19 0554 10/02/19 0413 10/03/19 1254 10/04/19 1018  WBC 11.0*  --   --  10.1 10.2 11.8* 11.7*  HGB 8.4*   < > 8.8* 7.8* 8.6* 10.1* 10.0*  HCT 27.7*   < > 26.0* 25.6* 27.9* 32.8* 33.1*  MCV 85.5  --   --  85.9 86.1 85.2 85.3  PLT 580*  --   --  500* 563* 682* 632*   < > = values in this interval not displayed.   Cardiac Enzymes: No results for input(s): CKTOTAL, CKMB, CKMBINDEX, TROPONINI in the last 168 hours. BNP: Invalid input(s): POCBNP CBG: Recent Labs  Lab 09/30/19 2109 10/01/19 0025 10/03/19 0959  GLUCAP 82 84 105*   D-Dimer No results for input(s): DDIMER in the last 72 hours. Hgb A1c No results for input(s): HGBA1C in the last 72 hours. Lipid Profile No results for input(s): CHOL, HDL, LDLCALC, TRIG, CHOLHDL, LDLDIRECT in the last 72 hours. Thyroid function studies No results for input(s): TSH, T4TOTAL, T3FREE, THYROIDAB in the last 72 hours.  Invalid input(s): FREET3 Anemia work up No results for input(s): VITAMINB12, FOLATE, FERRITIN, TIBC, IRON, RETICCTPCT in the last 72 hours. Urinalysis    Component Value Date/Time   COLORURINE YELLOW 09/30/2019 1800   APPEARANCEUR TURBID (A) 09/30/2019 1800   LABSPEC 1.006 09/30/2019 1800   PHURINE 5.0 09/30/2019 1800   GLUCOSEU NEGATIVE 09/30/2019 1800   HGBUR LARGE (A) 09/30/2019 1800   BILIRUBINUR NEGATIVE 09/30/2019 1800   KETONESUR NEGATIVE 09/30/2019 1800   PROTEINUR 100 (A) 09/30/2019 1800   NITRITE NEGATIVE 09/30/2019 1800   LEUKOCYTESUR LARGE (A) 09/30/2019 1800   Sepsis Labs Invalid input(s): PROCALCITONIN,  WBC,   LACTICIDVEN Microbiology Recent Results (from the past 240 hour(s))  SARS Coronavirus 2 by  RT PCR (hospital order, performed in [facility] hospital lab) Nasopharyngeal Nasopharyngeal Swab     Status: None   Collection Time: 09/30/19  8:02 PM   Specimen: Nasopharyngeal Swab  Result Value Ref Range Status   SARS Coronavirus 2 NEGATIVE NEGATIVE Final    Comment: (NOTE) SARS-CoV-2 target nucleic acids are NOT DETECTED.  The SARS-CoV-2 RNA is generally detectable in upper and lower respiratory specimens during the acute phase of infection. The lowest concentration of SARS-CoV-2 viral copies this assay can detect is 250 copies / mL. A negative result does not preclude SARS-CoV-2 infection and should not be used as the sole basis for treatment or other patient management decisions.  A negative result may occur with improper specimen collection / handling, submission of specimen other than nasopharyngeal swab, presence of viral mutation(s) within the areas targeted by this assay, and inadequate number of viral copies (<250 copies / mL). A negative result must be combined with clinical observations, patient history, and epidemiological information.  Fact Sheet for Patients:   StrictlyIdeas.no  Fact Sheet for Healthcare Providers: BankingDealers.co.za  This test is not yet approved or  cleared by the [place] FDA and has been authorized for detection and/or diagnosis of SARS-CoV-2 by FDA under an Emergency Use Authorization (EUA).  This EUA will remain in effect (meaning this test can be used) for the duration of the COVID-19 declaration under Section 564(b)(1) of the Act, 21 U.S.C. section 360bbb-3(b)(1), unless the authorization is terminated or revoked sooner.  Performed at [facility], [place] [address] [place], [place] [address]   Culture, Urine     Status: Abnormal   Collection Time: 09/30/19 11:45 PM   Specimen: Urine,  Catheterized  Result Value Ref Range Status   Specimen Description URINE, CATHETERIZED  Final   Special Requests   Final    NONE Performed at [facility], [place] [address] [place], [place] [address]    Culture MULTIPLE SPECIES PRESENT, SUGGEST RECOLLECTION (A)  Final   Report Status 10/02/2019 FINAL  Final     Time coordinating discharge: 40 minutes  SIGNED:   Elmarie Shiley, MD  [facility]

## 2019-10-05 NOTE — Progress Notes (Signed)
Notified by nursing of situation with tub bench. Charge nurse, Kami, had already spoken with Bertrum Sol ([employer]) concerning situation. Patient had order for tub bench and 3N1 - per Medicare guidelines 3N1 is covered but not tub bench. NCM spoke with Keon to confirm that 3N1 would be delivered to patient home. Kami to follow up with patient.   Manya Silvas, RN MSN CCM Transitions of Care [phone]

## 2020-03-24 DIAGNOSIS — G478 Other sleep disorders: Secondary | ICD-10-CM | POA: Diagnosis not present

## 2020-03-24 DIAGNOSIS — K148 Other diseases of tongue: Secondary | ICD-10-CM | POA: Diagnosis not present

## 2020-03-28 ENCOUNTER — Emergency Department (HOSPITAL_COMMUNITY)
Admission: EM | Admit: 2020-03-28 | Discharge: 2020-03-28 | Disposition: A | Discharge status: Home / Self Care | Payer: Medicare Other | Attending: Emergency Medicine | Admitting: Emergency Medicine

## 2020-03-28 ENCOUNTER — Emergency Department (HOSPITAL_COMMUNITY): Payer: Medicare Other

## 2020-03-28 ENCOUNTER — Encounter (HOSPITAL_COMMUNITY): Payer: Self-pay | Admitting: Emergency Medicine

## 2020-03-28 ENCOUNTER — Other Ambulatory Visit: Payer: Self-pay

## 2020-03-28 DIAGNOSIS — Z79899 Other long term (current) drug therapy: Secondary | ICD-10-CM | POA: Diagnosis not present

## 2020-03-28 DIAGNOSIS — Z96653 Presence of artificial knee joint, bilateral: Secondary | ICD-10-CM | POA: Diagnosis not present

## 2020-03-28 DIAGNOSIS — I517 Cardiomegaly: Secondary | ICD-10-CM | POA: Diagnosis not present

## 2020-03-28 DIAGNOSIS — Z8651 Personal history of combat and operational stress reaction: Secondary | ICD-10-CM | POA: Insufficient documentation

## 2020-03-28 DIAGNOSIS — R0602 Shortness of breath: Secondary | ICD-10-CM | POA: Diagnosis not present

## 2020-03-28 DIAGNOSIS — I10 Essential (primary) hypertension: Secondary | ICD-10-CM | POA: Insufficient documentation

## 2020-03-28 DIAGNOSIS — E039 Hypothyroidism, unspecified: Secondary | ICD-10-CM | POA: Diagnosis not present

## 2020-03-28 DIAGNOSIS — J329 Chronic sinusitis, unspecified: Secondary | ICD-10-CM | POA: Diagnosis not present

## 2020-03-28 LAB — BASIC METABOLIC PANEL
Anion gap: 14 (ref 5–15)
BUN: 34 mg/dL — ABNORMAL HIGH (ref 8–23)
CO2: 21 mmol/L — ABNORMAL LOW (ref 22–32)
Calcium: 9.2 mg/dL (ref 8.9–10.3)
Chloride: 99 mmol/L (ref 98–111)
Creatinine, Ser: 3.08 mg/dL — ABNORMAL HIGH (ref 0.44–1.00)
GFR, Estimated: 16 mL/min — ABNORMAL LOW (ref >60)
Glucose, Bld: 119 mg/dL — ABNORMAL HIGH (ref 70–99)
Potassium: 3.8 mmol/L (ref 3.5–5.1)
Sodium: 134 mmol/L — ABNORMAL LOW (ref 135–145)

## 2020-03-28 LAB — CBC
HCT: 41.1 % (ref 36.0–46.0)
Hemoglobin: 13.5 g/dL (ref 12.0–15.0)
MCH: 28.6 pg (ref 26.0–34.0)
MCHC: 32.8 g/dL (ref 30.0–36.0)
MCV: 87.1 fL (ref 80.0–100.0)
Platelets: 410 10*3/uL — ABNORMAL HIGH (ref 150–400)
RBC: 4.72 MIL/uL (ref 3.87–5.11)
RDW: 14.3 % (ref 11.5–15.5)
WBC: 10.7 10*3/uL — ABNORMAL HIGH (ref 4.0–10.5)
nRBC: 0 % (ref 0.0–0.2)

## 2020-03-28 MED ORDER — DOXYCYCLINE HYCLATE 100 MG PO CAPS
100.0000 mg | ORAL_CAPSULE | Freq: Two times a day (BID) | ORAL | 0 refills | Status: AC
Start: 2020-03-28 — End: 2020-04-04

## 2020-03-28 NOTE — ED Triage Notes (Signed)
C/o SOB at rest and sores in throat and roof of mouth x 1 week. Speaking in complete sentences.

## 2020-03-28 NOTE — Discharge Instructions (Addendum)
You have been seen and discharged from the emergency department.  Follow-up with your primary provider and dental for further care and reevaluation. Take new prescriptions and home medications as prescribed.  If tongue lesions/tender lymph nodes do not resolve these will need to be evaluated further and consider for biopsy.  If you have any worsening symptoms or further concerns for health please return to an emergency department for further evaluation.

## 2020-03-28 NOTE — ED Provider Notes (Signed)
[facility] EMERGENCY DEPARTMENT Provider Note   CSN: [id number] Arrival date & time: 03/28/20  1058     History Chief Complaint  Patient presents with  . Shortness of Breath    Shelia Cruz is a 70 y.o. female.  HPI   70 year old female presents to the emergency department with concern for ongoing sinusitis and tongue lesions.  Patient states for the last 8 months she has been having a problem with bite marks on the side of her tongue from grinding her teeth at night.  She has scheduled outpatient follow-up with dental to be evaluated for a nightguard to prevent this.  Close to 2 weeks ago she developed nasal congestion and thick green snot.  She states her primary doctor put her on a course of amoxicillin, this improved her symptoms but has not resolved it.  She presents now with continued nasal congestion and discolored snot which she says at times makes her feel short of breath.  She denies any fever, chest pain, cough, GI symptoms, swelling of her lower extremities.  She denies any difficulty swallowing but does admit to tender lymph nodes on the left.  Past Medical History:  Diagnosis Date  . Cancer [facility])    bladder cancer  . GERD (gastroesophageal reflux disease)   . Hypertension   . Pancreatic mass   . Renal disorder   . Thyroid disease     Patient Active Problem List   Diagnosis Date Noted  . AKI (acute kidney injury) ([place]) 09/30/2019  . Hyperkalemia 09/30/2019  . History of bladder cancer 09/30/2019  . History of left nephrectomy 09/30/2019  . History of total cystectomy 09/30/2019  . Metabolic acidosis 29/93/7169  . Hemoptysis 09/30/2019  . Hypothyroidism 09/30/2019  . Restless legs syndrome 09/30/2019  . Ureteral obstruction 09/30/2019  . Acute blood loss anemia 09/30/2019  . Chest tightness 08/13/2018  . Hypoxia 08/13/2018  . Hypomagnesemia 08/13/2018  . Cancer ([place])   . Essential hypertension   . GERD (gastroesophageal reflux  disease)     Past Surgical History:  Procedure Laterality Date  . ABDOMINAL HYSTERECTOMY    . BREAST BIOPSY Left    benign  . CHOLECYSTECTOMY    . CYSTOSCOPY    . IR EXT NEPHROURETERAL CATH EXCHANGE  09/30/2019  . KIDNEY SURGERY    . NASAL SEPTUM SURGERY    . REPLACEMENT TOTAL KNEE BILATERAL       OB History   No obstetric history on file.     Family History  Problem Relation Age of Onset  . Hypertension Mother   . Diabetes Other     Social History   Tobacco Use  . Smoking status: Never Smoker  . Smokeless tobacco: Never Used  Substance Use Topics  . Alcohol use: No  . Drug use: No    Home Medications Prior to Admission medications   Medication Sig Start Date End Date Taking? Authorizing Provider  amLODipine (NORVASC) 2.5 MG tablet Take 1 tablet (2.5 mg total) by mouth daily. 10/05/19  Yes Regalado, Belkys A, MD  bimatoprost (LUMIGAN) 0.01 % SOLN Place 1 drop into both eyes at bedtime. 04/15/19  Yes [provider]  Cholecalciferol (VITAMIN D3) 50 MCG (2000 UT) capsule Take 2,000 Units by mouth 4 (four) times a week. Tuesday, Thursday, Saturday and Sunday   Yes [provider]  doxycycline (VIBRAMYCIN) 100 MG capsule Take 1 capsule (100 mg total) by mouth 2 (two) times daily for 7 days. 03/28/20 04/04/20 Yes  Xaviera Flaten M, DO  ferrous sulfate 325 (65 FE) MG tablet Take 325 mg by mouth daily with breakfast.   Yes [provider]  fluticasone (FLONASE) 50 MCG/ACT nasal spray Place 2 sprays into the nose daily.   Yes [provider]  hydrOXYzine (ATARAX/VISTARIL) 25 MG tablet Take 1 tablet (25 mg total) by mouth 3 (three) times daily as needed for anxiety. Patient taking differently: Take 25 mg by mouth 3 (three) times daily. 10/04/19  Yes Regalado, Belkys A, MD  levothyroxine (SYNTHROID) 125 MCG tablet Take 100 mcg by mouth daily before breakfast.    Yes [provider]  Multiple Vitamin (MULTIVITAMIN) capsule Take 1 capsule by  mouth daily.   Yes [provider]  pantoprazole (PROTONIX) 40 MG tablet Take 1 tablet (40 mg total) by mouth 2 (two) times daily. Patient taking differently: Take 40 mg by mouth daily. 10/04/19  Yes Regalado, Belkys A, MD  Probiotic Product (ALIGN) 4 MG CAPS Take 1 capsule by mouth daily.   Yes [provider]  rOPINIRole (REQUIP) 2 MG tablet Take 1 tablet (2 mg total) by mouth at bedtime as needed (restless legs). Patient taking differently: Take 2 mg by mouth in the morning and at bedtime. 10/04/19  Yes Regalado, Belkys A, MD  sertraline (ZOLOFT) 25 MG tablet Take 1 tablet (25 mg total) by mouth daily. 10/09/19  Yes Regalado, Belkys A, MD  DULoxetine (CYMBALTA) 20 MG capsule Take 1 capsule (20 mg total) by mouth daily for 2 days. 10/06/19 10/08/19  Regalado, Belkys A, MD  DULoxetine 40 MG CPEP Take 40 mg by mouth daily for 1 day. 10/05/19 10/06/19  Regalado, Jerald Kief A, MD    Allergies    Iodinated diagnostic agents, Sulfamethoxazole-trimethoprim, Ciprofloxacin, and Sulfacetamide sodium  Review of Systems   Review of Systems  Constitutional: Negative for chills and fever.  HENT: Positive for congestion, mouth sores, postnasal drip, rhinorrhea, sinus pressure and sinus pain. Negative for ear pain, nosebleeds, sore throat, trouble swallowing and voice change.   Eyes: Negative for visual disturbance.  Respiratory: Negative for shortness of breath.   Cardiovascular: Negative for chest pain.  Gastrointestinal: Negative for abdominal pain, diarrhea and vomiting.  Genitourinary: Negative for dysuria.  Skin: Negative for rash.  Neurological: Negative for headaches.    Physical Exam Updated Vital Signs BP (!) 145/86   Pulse (!) 57   Temp 98.1 F (36.7 C)   Resp 16   SpO2 99%   Physical Exam Vitals and nursing note reviewed.  Constitutional:      Appearance: Normal appearance.  HENT:     Head: Normocephalic.     Nose: Nasal tenderness, mucosal edema, congestion and  rhinorrhea present.     Right Turbinates: Swollen.     Right Sinus: Maxillary sinus tenderness and frontal sinus tenderness present.     Mouth/Throat:     Mouth: Mucous membranes are moist.     Pharynx: No pharyngeal swelling or oropharyngeal exudate.      Comments: 2 small tongue bite/ulceration marks on the lateral aspects of the tongue, no bleeding, no signs of thrush, uvula is midline and nonswollen, no difficulty swallowing, no stridor, there is an area of gingival swelling on the roof of the mouth that the patient says has been chronic, evaluated by ENT, unchanged Eyes:     Extraocular Movements: Extraocular movements intact.     Pupils: Pupils are equal, round, and reactive to light.  Neck:     Comments: + Tender submandibular  adenopathy on the left Cardiovascular:     Rate and Rhythm: Normal rate.  Pulmonary:     Effort: Pulmonary effort is normal. No respiratory distress.  Abdominal:     Palpations: Abdomen is soft.     Tenderness: There is no abdominal tenderness.  Skin:    General: Skin is warm.  Neurological:     Mental Status: She is alert and oriented to person, place, and time. Mental status is at baseline.  Psychiatric:        Mood and Affect: Mood normal.     ED Results / Procedures / Treatments   Labs (all labs ordered are listed, but only abnormal results are displayed) Labs Reviewed  BASIC METABOLIC PANEL - Abnormal; Notable for the following components:      Result Value   Sodium 134 (*)    CO2 21 (*)    Glucose, Bld 119 (*)    BUN 34 (*)    Creatinine, Ser 3.08 (*)    GFR, Estimated 16 (*)    All other components within normal limits  CBC - Abnormal; Notable for the following components:   WBC 10.7 (*)    Platelets 410 (*)    All other components within normal limits    EKG EKG Interpretation  Date/Time:  Saturday March 28 2020 11:36:18 EST Ventricular Rate:  63 PR Interval:    QRS Duration: 72 QT Interval:  404 QTC Calculation: 413 R  Axis:   50 Text Interpretation: Junctional rhythm Cannot rule out Anterior infarct , age undetermined Abnormal ECG Sinus rhythm, no STEMI Confirmed by Lavenia Atlas [phone]) on 03/28/2020 4:31:50 PM   Radiology DG Chest Portable 1 View  Result Date: 03/28/2020 CLINICAL DATA:  70 year old female shortness of breath, history of hypertension. EXAM: PORTABLE CHEST 1 VIEW COMPARISON:  09/30/2019 FINDINGS: Unchanged mild cardiomegaly. Cardiomediastinal silhouette is otherwise within normal limits. Both lungs are clear. The visualized skeletal structures are unremarkable. IMPRESSION: No acute cardiopulmonary process.  Unchanged mild cardiomegaly. Electronically Signed   By: Ruthann Cancer MD   On: 03/28/2020 13:03    Procedures Procedures (including critical care time)  Medications Ordered in ED Medications - No data to display  ED Course  I have reviewed the triage vital signs and the nursing notes.  Pertinent labs & imaging results that were available during my care of the patient were reviewed by me and considered in my medical decision making (see chart for details).    MDM Rules/Calculators/A&P                          70 year old female presents the emergency department with ongoing sinus congestion, discolored snot, sinus pressure, postnasal drip and tongue lesions.  Tongue lesions are chronic, she states are secondary to her biting her tongue and grinding her teeth at night, she already has outpatient follow-up with dental, these do not look acutely concerning right now, no signs of thrush, will follow-up as an outpatient for this problem.  I advise getting an over-the-counter groin guard in the meantime.  She has sinus congestion, tenderness to palpation, findings of sinusitis.  She completed a course of amoxicillin, will escalate her antibiotics.  She will follow-up with her primary doctor.  No other acute complaints no chest pain/shortness of breath/cough.  Patient will be discharged and  treated as an outpatient.  Discharge plan and strict return to ED precautions discussed, patient verbalizes understanding and agreement.  Final Clinical Impression(s) / ED  Diagnoses Final diagnoses:  Sinusitis, unspecified chronicity, unspecified location    Rx / DC Orders ED Discharge Orders         Ordered    doxycycline (VIBRAMYCIN) 100 MG capsule  2 times daily        03/28/20 1804           Bonniejean Piano, Alvin Critchley, DO 03/28/20 1820

## 2020-03-31 DIAGNOSIS — I129 Hypertensive chronic kidney disease with stage 1 through stage 4 chronic kidney disease, or unspecified chronic kidney disease: Secondary | ICD-10-CM | POA: Diagnosis not present

## 2020-03-31 DIAGNOSIS — G4733 Obstructive sleep apnea (adult) (pediatric): Secondary | ICD-10-CM | POA: Diagnosis not present

## 2020-03-31 DIAGNOSIS — G2581 Restless legs syndrome: Secondary | ICD-10-CM | POA: Diagnosis not present

## 2020-03-31 DIAGNOSIS — E039 Hypothyroidism, unspecified: Secondary | ICD-10-CM | POA: Diagnosis not present

## 2020-03-31 DIAGNOSIS — J018 Other acute sinusitis: Secondary | ICD-10-CM | POA: Diagnosis not present

## 2020-03-31 DIAGNOSIS — K148 Other diseases of tongue: Secondary | ICD-10-CM | POA: Diagnosis not present

## 2020-04-16 DIAGNOSIS — G4733 Obstructive sleep apnea (adult) (pediatric): Secondary | ICD-10-CM | POA: Diagnosis not present

## 2020-04-16 DIAGNOSIS — E039 Hypothyroidism, unspecified: Secondary | ICD-10-CM | POA: Diagnosis not present

## 2020-04-16 DIAGNOSIS — I10 Essential (primary) hypertension: Secondary | ICD-10-CM | POA: Diagnosis not present

## 2020-04-17 DIAGNOSIS — G4733 Obstructive sleep apnea (adult) (pediatric): Secondary | ICD-10-CM | POA: Diagnosis not present

## 2020-04-23 DIAGNOSIS — R599 Enlarged lymph nodes, unspecified: Secondary | ICD-10-CM | POA: Diagnosis not present

## 2020-04-23 DIAGNOSIS — R1314 Dysphagia, pharyngoesophageal phase: Secondary | ICD-10-CM | POA: Diagnosis not present

## 2020-04-29 ENCOUNTER — Other Ambulatory Visit: Payer: Self-pay | Admitting: Family Medicine

## 2020-04-29 ENCOUNTER — Other Ambulatory Visit (HOSPITAL_COMMUNITY): Payer: Self-pay | Admitting: Family Medicine

## 2020-04-29 DIAGNOSIS — R221 Localized swelling, mass and lump, neck: Secondary | ICD-10-CM | POA: Diagnosis not present

## 2020-04-29 DIAGNOSIS — N185 Chronic kidney disease, stage 5: Secondary | ICD-10-CM | POA: Diagnosis not present

## 2020-05-07 ENCOUNTER — Ambulatory Visit (HOSPITAL_COMMUNITY)
Admission: RE | Admit: 2020-05-07 | Discharge: 2020-05-07 | Disposition: A | Discharge status: Home / Self Care | Payer: Medicare Other | Source: Ambulatory Visit | Attending: Family Medicine | Admitting: Family Medicine

## 2020-05-07 ENCOUNTER — Other Ambulatory Visit (HOSPITAL_BASED_OUTPATIENT_CLINIC_OR_DEPARTMENT_OTHER): Payer: Self-pay

## 2020-05-07 ENCOUNTER — Other Ambulatory Visit: Payer: Self-pay

## 2020-05-07 DIAGNOSIS — Z85528 Personal history of other malignant neoplasm of kidney: Secondary | ICD-10-CM | POA: Diagnosis not present

## 2020-05-07 DIAGNOSIS — G2581 Restless legs syndrome: Secondary | ICD-10-CM

## 2020-05-07 DIAGNOSIS — G4733 Obstructive sleep apnea (adult) (pediatric): Secondary | ICD-10-CM

## 2020-05-07 DIAGNOSIS — R0683 Snoring: Secondary | ICD-10-CM

## 2020-05-07 DIAGNOSIS — R221 Localized swelling, mass and lump, neck: Secondary | ICD-10-CM | POA: Insufficient documentation

## 2020-05-07 DIAGNOSIS — I6529 Occlusion and stenosis of unspecified carotid artery: Secondary | ICD-10-CM | POA: Diagnosis not present

## 2020-05-07 DIAGNOSIS — R1310 Dysphagia, unspecified: Secondary | ICD-10-CM | POA: Diagnosis not present

## 2020-05-08 DIAGNOSIS — Z0000 Encounter for general adult medical examination without abnormal findings: Secondary | ICD-10-CM | POA: Diagnosis not present

## 2020-05-11 ENCOUNTER — Other Ambulatory Visit: Payer: Self-pay | Admitting: Family Medicine

## 2020-05-11 DIAGNOSIS — Z1231 Encounter for screening mammogram for malignant neoplasm of breast: Secondary | ICD-10-CM

## 2020-05-18 DIAGNOSIS — Z936 Other artificial openings of urinary tract status: Secondary | ICD-10-CM | POA: Diagnosis not present

## 2020-05-21 DIAGNOSIS — Z9221 Personal history of antineoplastic chemotherapy: Secondary | ICD-10-CM | POA: Diagnosis not present

## 2020-05-21 DIAGNOSIS — Z9889 Other specified postprocedural states: Secondary | ICD-10-CM | POA: Diagnosis not present

## 2020-05-21 DIAGNOSIS — C652 Malignant neoplasm of left renal pelvis: Secondary | ICD-10-CM | POA: Diagnosis not present

## 2020-05-21 DIAGNOSIS — Z08 Encounter for follow-up examination after completed treatment for malignant neoplasm: Secondary | ICD-10-CM | POA: Diagnosis not present

## 2020-05-21 DIAGNOSIS — Z905 Acquired absence of kidney: Secondary | ICD-10-CM | POA: Diagnosis not present

## 2020-05-21 DIAGNOSIS — Z960 Presence of urogenital implants: Secondary | ICD-10-CM | POA: Diagnosis not present

## 2020-05-21 DIAGNOSIS — C672 Malignant neoplasm of lateral wall of bladder: Secondary | ICD-10-CM | POA: Diagnosis not present

## 2020-05-29 DIAGNOSIS — M79641 Pain in right hand: Secondary | ICD-10-CM | POA: Diagnosis not present

## 2020-06-03 DIAGNOSIS — R112 Nausea with vomiting, unspecified: Secondary | ICD-10-CM | POA: Diagnosis not present

## 2020-06-03 DIAGNOSIS — K2100 Gastro-esophageal reflux disease with esophagitis, without bleeding: Secondary | ICD-10-CM | POA: Diagnosis not present

## 2020-06-08 DIAGNOSIS — Z936 Other artificial openings of urinary tract status: Secondary | ICD-10-CM | POA: Diagnosis not present

## 2020-06-11 DIAGNOSIS — K862 Cyst of pancreas: Secondary | ICD-10-CM | POA: Diagnosis not present

## 2020-06-11 DIAGNOSIS — R1319 Other dysphagia: Secondary | ICD-10-CM | POA: Diagnosis not present

## 2020-06-11 DIAGNOSIS — R1310 Dysphagia, unspecified: Secondary | ICD-10-CM | POA: Diagnosis not present

## 2020-06-18 DIAGNOSIS — Z436 Encounter for attention to other artificial openings of urinary tract: Secondary | ICD-10-CM | POA: Diagnosis not present

## 2020-06-18 DIAGNOSIS — C672 Malignant neoplasm of lateral wall of bladder: Secondary | ICD-10-CM | POA: Diagnosis not present

## 2020-06-18 DIAGNOSIS — Z936 Other artificial openings of urinary tract status: Secondary | ICD-10-CM | POA: Diagnosis not present

## 2020-06-20 DIAGNOSIS — M79641 Pain in right hand: Secondary | ICD-10-CM | POA: Diagnosis not present

## 2020-06-22 DIAGNOSIS — N185 Chronic kidney disease, stage 5: Secondary | ICD-10-CM | POA: Diagnosis not present

## 2020-06-22 DIAGNOSIS — R5383 Other fatigue: Secondary | ICD-10-CM | POA: Diagnosis not present

## 2020-06-22 DIAGNOSIS — M5432 Sciatica, left side: Secondary | ICD-10-CM | POA: Diagnosis not present

## 2020-06-22 DIAGNOSIS — I129 Hypertensive chronic kidney disease with stage 1 through stage 4 chronic kidney disease, or unspecified chronic kidney disease: Secondary | ICD-10-CM | POA: Diagnosis not present

## 2020-06-30 ENCOUNTER — Inpatient Hospital Stay: Admission: RE | Admit: 2020-06-30 | Payer: Medicare Other | Source: Ambulatory Visit

## 2020-07-01 DIAGNOSIS — R2231 Localized swelling, mass and lump, right upper limb: Secondary | ICD-10-CM | POA: Diagnosis not present

## 2020-07-01 DIAGNOSIS — M79641 Pain in right hand: Secondary | ICD-10-CM | POA: Diagnosis not present

## 2020-07-20 DIAGNOSIS — R1310 Dysphagia, unspecified: Secondary | ICD-10-CM | POA: Diagnosis not present

## 2020-07-20 DIAGNOSIS — K8689 Other specified diseases of pancreas: Secondary | ICD-10-CM | POA: Diagnosis not present

## 2020-07-20 DIAGNOSIS — K449 Diaphragmatic hernia without obstruction or gangrene: Secondary | ICD-10-CM | POA: Diagnosis not present

## 2020-07-20 DIAGNOSIS — K862 Cyst of pancreas: Secondary | ICD-10-CM | POA: Diagnosis not present

## 2020-07-20 DIAGNOSIS — R1319 Other dysphagia: Secondary | ICD-10-CM | POA: Diagnosis not present

## 2020-07-23 DIAGNOSIS — D492 Neoplasm of unspecified behavior of bone, soft tissue, and skin: Secondary | ICD-10-CM | POA: Diagnosis not present

## 2020-07-23 DIAGNOSIS — D1721 Benign lipomatous neoplasm of skin and subcutaneous tissue of right arm: Secondary | ICD-10-CM | POA: Diagnosis not present

## 2020-07-27 DIAGNOSIS — C7911 Secondary malignant neoplasm of bladder: Secondary | ICD-10-CM | POA: Diagnosis not present

## 2020-07-27 DIAGNOSIS — N189 Chronic kidney disease, unspecified: Secondary | ICD-10-CM | POA: Diagnosis not present

## 2020-07-27 DIAGNOSIS — N2581 Secondary hyperparathyroidism of renal origin: Secondary | ICD-10-CM | POA: Diagnosis not present

## 2020-07-27 DIAGNOSIS — I129 Hypertensive chronic kidney disease with stage 1 through stage 4 chronic kidney disease, or unspecified chronic kidney disease: Secondary | ICD-10-CM | POA: Diagnosis not present

## 2020-07-27 DIAGNOSIS — N179 Acute kidney failure, unspecified: Secondary | ICD-10-CM | POA: Diagnosis not present

## 2020-08-04 DIAGNOSIS — M79641 Pain in right hand: Secondary | ICD-10-CM | POA: Diagnosis not present

## 2020-08-12 DIAGNOSIS — E039 Hypothyroidism, unspecified: Secondary | ICD-10-CM | POA: Diagnosis not present

## 2020-08-12 DIAGNOSIS — N185 Chronic kidney disease, stage 5: Secondary | ICD-10-CM | POA: Diagnosis not present

## 2020-08-12 DIAGNOSIS — Z936 Other artificial openings of urinary tract status: Secondary | ICD-10-CM | POA: Diagnosis not present

## 2020-08-12 DIAGNOSIS — R531 Weakness: Secondary | ICD-10-CM | POA: Diagnosis not present

## 2020-08-12 DIAGNOSIS — R110 Nausea: Secondary | ICD-10-CM | POA: Diagnosis not present

## 2020-08-14 DIAGNOSIS — M25641 Stiffness of right hand, not elsewhere classified: Secondary | ICD-10-CM | POA: Diagnosis not present

## 2020-08-25 DIAGNOSIS — M79641 Pain in right hand: Secondary | ICD-10-CM | POA: Diagnosis not present

## 2020-09-01 DIAGNOSIS — C672 Malignant neoplasm of lateral wall of bladder: Secondary | ICD-10-CM | POA: Diagnosis not present

## 2020-09-01 DIAGNOSIS — Z936 Other artificial openings of urinary tract status: Secondary | ICD-10-CM | POA: Diagnosis not present

## 2020-11-11 DIAGNOSIS — E039 Hypothyroidism, unspecified: Secondary | ICD-10-CM | POA: Diagnosis not present

## 2020-11-19 DIAGNOSIS — N2581 Secondary hyperparathyroidism of renal origin: Secondary | ICD-10-CM | POA: Diagnosis not present

## 2020-11-19 DIAGNOSIS — N179 Acute kidney failure, unspecified: Secondary | ICD-10-CM | POA: Diagnosis not present

## 2020-11-19 DIAGNOSIS — R300 Dysuria: Secondary | ICD-10-CM | POA: Diagnosis not present

## 2020-11-19 DIAGNOSIS — I129 Hypertensive chronic kidney disease with stage 1 through stage 4 chronic kidney disease, or unspecified chronic kidney disease: Secondary | ICD-10-CM | POA: Diagnosis not present

## 2020-11-19 DIAGNOSIS — N189 Chronic kidney disease, unspecified: Secondary | ICD-10-CM | POA: Diagnosis not present

## 2020-11-19 DIAGNOSIS — C7911 Secondary malignant neoplasm of bladder: Secondary | ICD-10-CM | POA: Diagnosis not present

## 2020-11-23 ENCOUNTER — Emergency Department (HOSPITAL_COMMUNITY): Payer: Medicare Other

## 2020-11-23 ENCOUNTER — Emergency Department (HOSPITAL_COMMUNITY)
Admission: EM | Admit: 2020-11-23 | Discharge: 2020-11-23 | Disposition: A | Discharge status: Home / Self Care | Payer: Medicare Other | Attending: Emergency Medicine | Admitting: Emergency Medicine

## 2020-11-23 ENCOUNTER — Other Ambulatory Visit: Payer: Self-pay

## 2020-11-23 ENCOUNTER — Encounter (HOSPITAL_COMMUNITY): Payer: Self-pay

## 2020-11-23 DIAGNOSIS — N390 Urinary tract infection, site not specified: Secondary | ICD-10-CM

## 2020-11-23 DIAGNOSIS — K4090 Unilateral inguinal hernia, without obstruction or gangrene, not specified as recurrent: Secondary | ICD-10-CM | POA: Diagnosis not present

## 2020-11-23 DIAGNOSIS — Z466 Encounter for fitting and adjustment of urinary device: Secondary | ICD-10-CM | POA: Diagnosis not present

## 2020-11-23 DIAGNOSIS — R319 Hematuria, unspecified: Secondary | ICD-10-CM | POA: Diagnosis present

## 2020-11-23 DIAGNOSIS — N1330 Unspecified hydronephrosis: Secondary | ICD-10-CM | POA: Diagnosis not present

## 2020-11-23 DIAGNOSIS — K5730 Diverticulosis of large intestine without perforation or abscess without bleeding: Secondary | ICD-10-CM | POA: Diagnosis not present

## 2020-11-23 LAB — URINALYSIS, ROUTINE W REFLEX MICROSCOPIC
Bilirubin Urine: NEGATIVE
Glucose, UA: NEGATIVE mg/dL
Ketones, ur: NEGATIVE mg/dL
Nitrite: POSITIVE — AB
Protein, ur: 100 mg/dL — AB
RBC / HPF: >50 RBC/hpf — ABNORMAL HIGH (ref 0–5)
Specific Gravity, Urine: 1.02 (ref 1.005–1.030)
WBC, UA: >50 WBC/hpf — ABNORMAL HIGH (ref 0–5)
pH: 6 (ref 5.0–8.0)

## 2020-11-23 LAB — CBC WITH DIFFERENTIAL/PLATELET
Abs Immature Granulocytes: 0.1 10*3/uL — ABNORMAL HIGH (ref 0.00–0.07)
Basophils Absolute: 0.1 10*3/uL (ref 0.0–0.1)
Basophils Relative: 1 %
Eosinophils Absolute: 0.4 10*3/uL (ref 0.0–0.5)
Eosinophils Relative: 3 %
HCT: 48.1 % — ABNORMAL HIGH (ref 36.0–46.0)
Hemoglobin: 15.2 g/dL — ABNORMAL HIGH (ref 12.0–15.0)
Immature Granulocytes: 1 %
Lymphocytes Relative: 15 %
Lymphs Abs: 1.7 10*3/uL (ref 0.7–4.0)
MCH: 28.3 pg (ref 26.0–34.0)
MCHC: 31.6 g/dL (ref 30.0–36.0)
MCV: 89.4 fL (ref 80.0–100.0)
Monocytes Absolute: 0.6 10*3/uL (ref 0.1–1.0)
Monocytes Relative: 6 %
Neutro Abs: 8.5 10*3/uL — ABNORMAL HIGH (ref 1.7–7.7)
Neutrophils Relative %: 74 %
Platelets: 409 10*3/uL — ABNORMAL HIGH (ref 150–400)
RBC: 5.38 MIL/uL — ABNORMAL HIGH (ref 3.87–5.11)
RDW: 13.9 % (ref 11.5–15.5)
WBC: 11.4 10*3/uL — ABNORMAL HIGH (ref 4.0–10.5)
nRBC: 0 % (ref 0.0–0.2)

## 2020-11-23 LAB — COMPREHENSIVE METABOLIC PANEL
ALT: 16 U/L (ref 0–44)
AST: 15 U/L (ref 15–41)
Albumin: 4.1 g/dL (ref 3.5–5.0)
Alkaline Phosphatase: 136 U/L — ABNORMAL HIGH (ref 38–126)
Anion gap: 11 (ref 5–15)
BUN: 37 mg/dL — ABNORMAL HIGH (ref 8–23)
CO2: 23 mmol/L (ref 22–32)
Calcium: 9.7 mg/dL (ref 8.9–10.3)
Chloride: 109 mmol/L (ref 98–111)
Creatinine, Ser: 2.55 mg/dL — ABNORMAL HIGH (ref 0.44–1.00)
GFR, Estimated: 20 mL/min — ABNORMAL LOW (ref >60)
Glucose, Bld: 105 mg/dL — ABNORMAL HIGH (ref 70–99)
Potassium: 3.9 mmol/L (ref 3.5–5.1)
Sodium: 143 mmol/L (ref 135–145)
Total Bilirubin: 0.5 mg/dL (ref 0.3–1.2)
Total Protein: 7.5 g/dL (ref 6.5–8.1)

## 2020-11-23 LAB — PROTIME-INR
INR: 1 (ref 0.8–1.2)
Prothrombin Time: 12.8 seconds (ref 11.4–15.2)

## 2020-11-23 MED ORDER — FENTANYL CITRATE PF 50 MCG/ML IJ SOSY
50.0000 ug | PREFILLED_SYRINGE | Freq: Once | INTRAMUSCULAR | Status: AC
  Administered 2020-11-23: 50 ug via INTRAVENOUS
  Filled 2020-11-23 (×2): qty 1

## 2020-11-23 MED ORDER — SODIUM CHLORIDE 0.9 % IV SOLN
1.0000 g | Freq: Once | INTRAVENOUS | Status: AC
  Administered 2020-11-23: 1 g via INTRAVENOUS
  Filled 2020-11-23: qty 10

## 2020-11-23 MED ORDER — HYDROCODONE-ACETAMINOPHEN 5-325 MG PO TABS: 1.0000 | ORAL_TABLET | Freq: Four times a day (QID) | ORAL | 0 refills | Status: DC | PRN

## 2020-11-23 MED ORDER — FENTANYL CITRATE PF 50 MCG/ML IJ SOSY
50.0000 ug | PREFILLED_SYRINGE | Freq: Once | INTRAMUSCULAR | Status: AC
  Administered 2020-11-23: 50 ug via INTRAVENOUS
  Filled 2020-11-23: qty 1

## 2020-11-23 MED ORDER — CEPHALEXIN 500 MG PO CAPS: 500.0000 mg | ORAL_CAPSULE | Freq: Three times a day (TID) | ORAL | 0 refills | Status: AC

## 2020-11-23 MED ORDER — HYDROCODONE-ACETAMINOPHEN 5-325 MG PO TABS
2.0000 | ORAL_TABLET | Freq: Once | ORAL | Status: AC
  Administered 2020-11-23: 2 via ORAL
  Filled 2020-11-23: qty 2

## 2020-11-23 NOTE — ED Notes (Signed)
ED Provider at bedside. 

## 2020-11-23 NOTE — ED Provider Notes (Signed)
[facility] EMERGENCY DEPARTMENT Provider Note  CSN: [id number] Arrival date & time: 11/23/20 1839    History Chief Complaint  Patient presents with   Hematuria    Shelia Cruz is a 70 y.o. female with history of bladder and renal cancer has had remote L nephrectomy followed by bladder chemo but cancer kept returning so she had a cystectomy last year. She has a urostomy and a ureteral stent, due for exchange tomorrow.  She has had worsening kidney functioning over time, followed by [facility], reports she has had weakness, poor appetite, itching for several months but since Friday she has had intermittent pelvic pain and then today she noted some bright red blood in her urostomy bag multiple times today. She has not had any clots. No fever nausea or vomiting. Does not take any blood thinners.    Past Medical History:  Diagnosis Date   Cancer [facility])    bladder cancer   GERD (gastroesophageal reflux disease)    Hypertension    Pancreatic mass    Renal disorder    Thyroid disease     Past Surgical History:  Procedure Laterality Date   ABDOMINAL HYSTERECTOMY     BREAST BIOPSY Left    benign   CHOLECYSTECTOMY     CYSTOSCOPY     IR EXT NEPHROURETERAL CATH EXCHANGE  09/30/2019   KIDNEY SURGERY     NASAL SEPTUM SURGERY     REPLACEMENT TOTAL KNEE BILATERAL      Family History  Problem Relation Age of Onset   Hypertension Mother    Diabetes Other     Social History   Tobacco Use   Smoking status: Never   Smokeless tobacco: Never  Vaping Use   Vaping Use: Never used  Substance Use Topics   Alcohol use: No   Drug use: No     Home Medications Prior to Admission medications   Medication Sig Start Date End Date Taking? Authorizing Provider  cephALEXin (KEFLEX) 500 MG capsule Take 1 capsule (500 mg total) by mouth 3 (three) times daily for 7 days. 11/23/20 11/30/20 Yes Truddie Hidden, MD  HYDROcodone-acetaminophen (NORCO/VICODIN) 5-325 MG tablet Take 1  tablet by mouth every 6 (six) hours as needed for severe pain. 11/23/20  Yes Truddie Hidden, MD  amLODipine (NORVASC) 2.5 MG tablet Take 1 tablet (2.5 mg total) by mouth daily. 10/05/19   Regalado, Belkys A, MD  bimatoprost (LUMIGAN) 0.01 % SOLN Place 1 drop into both eyes at bedtime. 04/15/19   [provider]  Cholecalciferol (VITAMIN D3) 50 MCG (2000 UT) capsule Take 2,000 Units by mouth 4 (four) times a week. Tuesday, Thursday, Saturday and Sunday    [provider]  DULoxetine (CYMBALTA) 20 MG capsule Take 1 capsule (20 mg total) by mouth daily for 2 days. 10/06/19 10/08/19  Regalado, Belkys A, MD  DULoxetine 40 MG CPEP Take 40 mg by mouth daily for 1 day. 10/05/19 10/06/19  Regalado, Belkys A, MD  ferrous sulfate 325 (65 FE) MG tablet Take 325 mg by mouth daily with breakfast.    [provider]  fluticasone (FLONASE) 50 MCG/ACT nasal spray Place 2 sprays into the nose daily.    [provider]  hydrOXYzine (ATARAX/VISTARIL) 25 MG tablet Take 1 tablet (25 mg total) by mouth 3 (three) times daily as needed for anxiety. Patient taking differently: Take 25 mg by mouth 3 (three) times daily. 10/04/19   Regalado, Cassie Freer, MD  levothyroxine (SYNTHROID) 125 MCG  tablet Take 100 mcg by mouth daily before breakfast.     [provider]  Multiple Vitamin (MULTIVITAMIN) capsule Take 1 capsule by mouth daily.    [provider]  pantoprazole (PROTONIX) 40 MG tablet Take 1 tablet (40 mg total) by mouth 2 (two) times daily. Patient taking differently: Take 40 mg by mouth daily. 10/04/19   Regalado, Belkys A, MD  Probiotic Product (ALIGN) 4 MG CAPS Take 1 capsule by mouth daily.    [provider]  rOPINIRole (REQUIP) 2 MG tablet Take 1 tablet (2 mg total) by mouth at bedtime as needed (restless legs). Patient taking differently: Take 2 mg by mouth in the morning and at bedtime. 10/04/19   Regalado, Belkys A, MD  sertraline (ZOLOFT) 25 MG tablet Take 1  tablet (25 mg total) by mouth daily. 10/09/19   Regalado, Jerald Kief A, MD     Allergies    Iodinated diagnostic agents, Sulfamethoxazole-trimethoprim, Ciprofloxacin, and Sulfacetamide sodium   Review of Systems   Review of Systems A comprehensive review of systems was completed and negative except as noted in HPI.    Physical Exam BP 140/74 (BP Location: Right Arm)   Pulse 71   Temp 98.2 F (36.8 C) (Oral)   Resp 18   Ht 5\' 5"  (1.651 m)   Wt 98.4 kg   SpO2 95%   BMI 36.11 kg/m   Physical Exam Vitals and nursing note reviewed.  Constitutional:      Appearance: Normal appearance.  HENT:     Head: Normocephalic and atraumatic.     Nose: Nose normal.     Mouth/Throat:     Mouth: Mucous membranes are moist.  Eyes:     Extraocular Movements: Extraocular movements intact.     Conjunctiva/sclera: Conjunctivae normal.  Cardiovascular:     Rate and Rhythm: Normal rate.  Pulmonary:     Effort: Pulmonary effort is normal.     Breath sounds: Normal breath sounds.  Abdominal:     General: Abdomen is flat.     Palpations: Abdomen is soft.     Tenderness: There is no abdominal tenderness.     Comments: Urostomy in RLQ with dark yellow urine in bag. No gross blood  Musculoskeletal:        General: No swelling. Normal range of motion.     Cervical back: Neck supple.  Skin:    General: Skin is warm and dry.  Neurological:     General: No focal deficit present.     Mental Status: She is alert.  Psychiatric:        Mood and Affect: Mood normal.     ED Results / Procedures / Treatments   Labs (all labs ordered are listed, but only abnormal results are displayed) Labs Reviewed  URINALYSIS, ROUTINE W REFLEX MICROSCOPIC - Abnormal; Notable for the following components:      Result Value   APPearance CLOUDY (*)    Hgb urine dipstick LARGE (*)    Protein, ur 100 (*)    Nitrite POSITIVE (*)    Leukocytes,Ua LARGE (*)    RBC / HPF >50 (*)    WBC, UA >50 (*)    Bacteria, UA MANY  (*)    All other components within normal limits  COMPREHENSIVE METABOLIC PANEL - Abnormal; Notable for the following components:   Glucose, Bld 105 (*)    BUN 37 (*)    Creatinine, Ser 2.55 (*)    Alkaline Phosphatase 136 (*)  GFR, Estimated 20 (*)    All other components within normal limits  CBC WITH DIFFERENTIAL/PLATELET - Abnormal; Notable for the following components:   WBC 11.4 (*)    RBC 5.38 (*)    Hemoglobin 15.2 (*)    HCT 48.1 (*)    Platelets 409 (*)    Neutro Abs 8.5 (*)    Abs Immature Granulocytes 0.10 (*)    All other components within normal limits  URINE CULTURE  PROTIME-INR    EKG None  Radiology CT Abdomen Pelvis Wo Contrast  Result Date: 11/23/2020 CLINICAL DATA:  Status post nephrectomy, hysterectomy, cystectomy with urostomy and ureteral stent placement. Pelvic pain, vaginal pain, hematuria. EXAM: CT ABDOMEN AND PELVIS WITHOUT CONTRAST TECHNIQUE: Multidetector CT imaging of the abdomen and pelvis was performed following the standard protocol without IV contrast. COMPARISON:  09/30/2019 FINDINGS: Lower chest: The visualized lung bases are clear. The visualized heart and pericardium are unremarkable. Hepatobiliary: No focal liver abnormality is seen. Status post cholecystectomy. No biliary dilatation. Pancreas: Unremarkable Spleen: Unremarkable Adrenals/Urinary Tract: The adrenal glands are unremarkable. Left nephrectomy has been performed. Right kidney is normal in size and position. Interval placement of a right ureteral stent, extending from the right renal pelvis into the a right lower quadrant urostomy appliance. Previously noted hydronephrosis has resolved. There is a single dilated posterior interpolar calyx noted, however, best seen on axial image # 33/2 and coronal image # 96/5 of unclear significance. This may represent residual caliceal dilation or sequela of a residual mild infundibular stenosis, however, un underlying mass within the a collecting system  can not be definitively excluded on this noncontrast examination. No intrarenal calcification. No perinephric fluid collections. Status post cystectomy. Stomach/Bowel: Stomach, small bowel, and appendix are unremarkable. Moderate transverse, descending, and sigmoid colonic diverticulosis without superimposed acute inflammatory change. The large bowel is otherwise unremarkable. No free intraperitoneal gas or fluid. Vascular/Lymphatic: Mild atherosclerotic calcification within the abdominal aorta. No aortic aneurysm. No pathologic adenopathy within the abdomen and pelvis. Reproductive: Status post hysterectomy. No adnexal masses. Other: Small fat containing left inguinal hernia. Rectum unremarkable. Musculoskeletal: No acute bone abnormality. No lytic or blastic bone lesion. Degenerative changes are seen within the lumbar spine. IMPRESSION: No acute intra-abdominal pathology identified. No definite radiographic explanation for the patient's reported symptoms. Status post left nephroureterectomy and cystectomy. Status post retrograde ureteral stent placement via right lower quadrant urostomy with resolution of hydronephrosis involving the residual right kidney. Focal interpolar caliceal dilation. See differential considerations above. In a patient with ongoing hematuria, CT or MR urography or ureteroscopy may be helpful to exclude an underlying mass of the collecting system. Moderate colonic diverticulosis. Aortic Atherosclerosis (ICD10-I70.0). Electronically Signed   By: Fidela Salisbury M.D.   On: 11/23/2020 21:41    Procedures Procedures  Medications Ordered in the ED Medications  cefTRIAXone (ROCEPHIN) 1 g in sodium chloride 0.9 % 100 mL IVPB (1 g Intravenous New Bag/Given 11/23/20 2154)  HYDROcodone-acetaminophen (NORCO/VICODIN) 5-325 MG per tablet 2 tablet (has no administration in time range)  fentaNYL (SUBLIMAZE) injection 50 mcg (50 mcg Intravenous Given 11/23/20 2002)  fentaNYL (SUBLIMAZE) injection 50  mcg (50 mcg Intravenous Given 11/23/20 2143)     MDM Rules/Calculators/A&P MDM Patient with complex history of bladder/renal cancer now with urostomy and ureteral stent in her remaining R kidney, has CKD as well. Now with gross hematuria at home. Will check labs and re-evaluate.   ED Course  I have reviewed the triage vital signs and the nursing notes.  Pertinent labs & imaging results that were available during my care of the patient were reviewed by me and considered in my medical decision making (see chart for details).  Clinical Course as of 11/23/20 2211  Mon Nov 23, 2020  1957 CBC with mild leukocytosis.  [CS]  2003 Coags are normal.  [CS]  2009 CMP with CKD, improved from previous labs available here. I cannot see results from recent follow up at [place].  [CS]  2101 UA with signs of infection and bleeding. Will begin Abx. Given her lower abdomen/pelvic pain (not near her ostomy or kidney) will check CT without contrast. With RN chaperone present, I also examined her external genitalia as she had a sensation of something trying to come out. I do not see any signs of prolapse,bleeding or mass.  [CS]  2205 Reviewed CT images and discussed results with the patient. No findings to explain her symptoms, but may be referred pain from her R ureteral stent/UTI. She does not have a fever or vomiting and would like to go home. She will be discharged with Rx for Keflex, Urine sent for culture. She will call IR at [facility] tomorrow to discuss whether to postpone her stent exchange while she is being treated for UTI.  [CS]    Clinical Course User Index [CS] Truddie Hidden, MD    Final Clinical Impression(s) / ED Diagnoses Final diagnoses:  Upper urinary tract infection    Rx / DC Orders ED Discharge Orders          Ordered    HYDROcodone-acetaminophen (NORCO/VICODIN) 5-325 MG tablet  Every 6 hours PRN        11/23/20 2210    cephALEXin (KEFLEX) 500 MG capsule  3 times daily         11/23/20 2210             Truddie Hidden, MD 11/23/20 2211

## 2020-11-23 NOTE — ED Triage Notes (Signed)
Patient states she began having a large amount of bright blood in her urine since 1220 today.  Patient has a history of kidney and bladder cancer.

## 2020-11-25 LAB — URINE CULTURE

## 2020-11-30 DIAGNOSIS — N179 Acute kidney failure, unspecified: Secondary | ICD-10-CM | POA: Diagnosis not present

## 2020-12-05 DIAGNOSIS — Z936 Other artificial openings of urinary tract status: Secondary | ICD-10-CM | POA: Diagnosis not present

## 2020-12-14 DIAGNOSIS — Z936 Other artificial openings of urinary tract status: Secondary | ICD-10-CM | POA: Diagnosis not present

## 2020-12-14 DIAGNOSIS — C672 Malignant neoplasm of lateral wall of bladder: Secondary | ICD-10-CM | POA: Diagnosis not present

## 2020-12-22 DIAGNOSIS — R52 Pain, unspecified: Secondary | ICD-10-CM | POA: Diagnosis not present

## 2020-12-22 DIAGNOSIS — Z23 Encounter for immunization: Secondary | ICD-10-CM | POA: Diagnosis not present

## 2020-12-22 DIAGNOSIS — L299 Pruritus, unspecified: Secondary | ICD-10-CM | POA: Diagnosis not present

## 2020-12-22 DIAGNOSIS — I129 Hypertensive chronic kidney disease with stage 1 through stage 4 chronic kidney disease, or unspecified chronic kidney disease: Secondary | ICD-10-CM | POA: Diagnosis not present

## 2020-12-22 DIAGNOSIS — R0602 Shortness of breath: Secondary | ICD-10-CM | POA: Diagnosis not present

## 2020-12-22 DIAGNOSIS — N184 Chronic kidney disease, stage 4 (severe): Secondary | ICD-10-CM | POA: Diagnosis not present

## 2020-12-24 ENCOUNTER — Ambulatory Visit
Admission: RE | Admit: 2020-12-24 | Discharge: 2020-12-24 | Disposition: A | Discharge status: Home / Self Care | Payer: Medicare Other | Source: Ambulatory Visit | Attending: Family Medicine | Admitting: Family Medicine

## 2020-12-24 ENCOUNTER — Other Ambulatory Visit: Payer: Self-pay | Admitting: Family Medicine

## 2020-12-24 DIAGNOSIS — R0602 Shortness of breath: Secondary | ICD-10-CM

## 2021-01-04 DIAGNOSIS — H401131 Primary open-angle glaucoma, bilateral, mild stage: Secondary | ICD-10-CM | POA: Diagnosis not present

## 2021-01-14 ENCOUNTER — Encounter: Payer: Self-pay | Admitting: Pulmonary Disease

## 2021-01-14 ENCOUNTER — Ambulatory Visit: Payer: Medicare Other | Admitting: Pulmonary Disease

## 2021-01-14 ENCOUNTER — Other Ambulatory Visit: Payer: Self-pay

## 2021-01-14 VITALS — BP 118/84 | HR 82 | Ht 65.0 in | Wt 218.4 lb

## 2021-01-14 DIAGNOSIS — R0609 Other forms of dyspnea: Secondary | ICD-10-CM | POA: Diagnosis not present

## 2021-01-14 DIAGNOSIS — J479 Bronchiectasis, uncomplicated: Secondary | ICD-10-CM | POA: Diagnosis not present

## 2021-01-14 MED ORDER — FLUTICASONE FUROATE-VILANTEROL 100-25 MCG/ACT IN AEPB: 1.0000 | INHALATION_SPRAY | Freq: Every day | RESPIRATORY_TRACT | 0 refills | Status: DC

## 2021-01-14 MED ORDER — FLUTICASONE FUROATE-VILANTEROL 200-25 MCG/ACT IN AEPB: 1.0000 | INHALATION_SPRAY | Freq: Every day | RESPIRATORY_TRACT | 3 refills | Status: DC

## 2021-01-14 NOTE — Patient Instructions (Addendum)
Nice to meet you  Use Breo 1 puff daily, rinse your mouth out after every use.  I prescribed the 200 mcg or higher dose.  I provided 2-week sample of the 100 mcg (a lower dose) to get started in case we have issues with your insurance.  If the co-pay is too high, ask the pharmacist what is preferred inhaled corticosteroid and long-acting beta agonist combination.  Call us and let us know if they are not helpful just let us know is too high we will try a different medication.  We will get pulmonary function test next available for further evaluation of your breathing and causes of your symptoms.  Return to clinic in 3 months or sooner as needed with Dr. Silas Flood

## 2021-01-18 DIAGNOSIS — I129 Hypertensive chronic kidney disease with stage 1 through stage 4 chronic kidney disease, or unspecified chronic kidney disease: Secondary | ICD-10-CM | POA: Diagnosis not present

## 2021-01-19 NOTE — Progress Notes (Signed)
@Patient  ID: Shelia Cruz, female    DOB: 1950-05-13, 70 y.o.   MRN: [id number]  Chief Complaint  Patient presents with   Consult    Referred by PCP for SOB for the past few months. Denies ever being on oxygen during the day or night.     Referring provider: Kathyrn Lass, MD  HPI:   70 y.o. whom we are seeing in consultation for evaluation of dyspnea on exertion.  PCP note reviewed.  She reports several month history of dyspnea.  Over 6 months.  Less than a year.  No clear inciting event.  No time of when things are better or worse.  No position to make things better or worse.  No seasonal environmental factors she can identify to make things better or worse.  Worse with inclines or stairs.  Present at rest at times.  Some chest tightness or heaviness that comes and goes.  Not reliably reproduced with exertion.  No other relieving or exacerbating factors.    Reviewed nuclear medicine cardiac stress test 07/2018 that is normal without evidence of ischemia.Reviewed TTE that demonstrated hypokinesis of the left ventricular basal mid inferior septal wall and inferior wall concerning for ischemia of the right coronary artery, borderline EF 50 to 55%, elevated left atrial pressure normal RV size and function.  Reviewed chest imaging CT 07/2018 that shows mild bibasilar bronchiectasis and mild bronchial thickening throughout on my interpretation.  Reviewed CT chest 11/23/2020 that shows mild bibasilar bronchiectasis without significant change in similar bronchial appearance when compared to 2020 on my interpretation.  PMH: Hypothyroidism, hypertension, GERD, history of bladder cancer Surgical history: Hysterectomy, cholecystectomy, knee replacement bilaterally, nephrectomy Family history: Mother with hypertension Social history: Never smoker, lives in [place] / Pulmonary Flowsheets:   ACT:  No flowsheet data found.  MMRC: No flowsheet data found.  [facility]worth:  No  flowsheet data found.  Tests:   FENO:  No results found for: NITRICOXIDE  PFT: No flowsheet data found.  WALK:  No flowsheet data found.  Imaging: Personally reviewed and as per EMR and discussion in this note DG Chest 2 View  Result Date: 12/24/2020 CLINICAL DATA:  Shortness of breath. EXAM: CHEST - 2 VIEW COMPARISON:  March 28, 2020. FINDINGS: The heart size and mediastinal contours are within normal limits. Both lungs are clear. The visualized skeletal structures are unremarkable. IMPRESSION: No active cardiopulmonary disease. Electronically Signed   By: Marijo Conception M.D.   On: 12/24/2020 14:43    Lab Results: Personally reviewed, notably mild elevation of eosinophils present CBC    Component Value Date/Time   WBC 11.4 (H) 11/23/2020 1924   RBC 5.38 (H) 11/23/2020 1924   HGB 15.2 (H) 11/23/2020 1924   HCT 48.1 (H) 11/23/2020 1924   PLT 409 (H) 11/23/2020 1924   MCV 89.4 11/23/2020 1924   MCH 28.3 11/23/2020 1924   MCHC 31.6 11/23/2020 1924   RDW 13.9 11/23/2020 1924   LYMPHSABS 1.7 11/23/2020 1924   MONOABS 0.6 11/23/2020 1924   EOSABS 0.4 11/23/2020 1924   BASOSABS 0.1 11/23/2020 1924    BMET    Component Value Date/Time   NA 143 11/23/2020 1924   K 3.9 11/23/2020 1924   CL 109 11/23/2020 1924   CO2 23 11/23/2020 1924   GLUCOSE 105 (H) 11/23/2020 1924   BUN 37 (H) 11/23/2020 1924   CREATININE 2.55 (H) 11/23/2020 1924   CALCIUM 9.7 11/23/2020 1924   GFRNONAA 20 (L) 11/23/2020 1924  GFRAA 5 (L) 10/04/2019 1018    BNP    Component Value Date/Time   BNP 71.1 08/12/2018 2306    ProBNP No results found for: PROBNP  Specialty Problems       Pulmonary Problems   Hypoxia   Hemoptysis    Allergies  Allergen Reactions   Iodinated Diagnostic Agents Other (See Comments)    Was told to avoid after being diagnosed with stage 3 kidney disease Was told to avoid after being diagnosed with stage 3 kidney disease    Sulfamethoxazole-Trimethoprim  Swelling    Facial swelling and rash   Ciprofloxacin Nausea And Vomiting   Sulfacetamide Sodium Swelling    Immunization History  Administered Date(s) Administered   Influenza, High Dose Seasonal PF 11/14/2019, 12/22/2020   PFIZER Comirnaty(Gray Top)Covid-19 Tri-Sucrose Vaccine 06/12/2019, 07/10/2019, 11/26/2019   Zoster Recombinat (Shingrix) 11/26/2019    Past Medical History:  Diagnosis Date   Cancer ([place])    bladder cancer   GERD (gastroesophageal reflux disease)    Hypertension    Pancreatic mass    Renal disorder    Thyroid disease     Tobacco History: Social History   Tobacco Use  Smoking Status Never  Smokeless Tobacco Never   Counseling given: Not Answered   Continue to not smoke  Outpatient Encounter Medications as of 01/14/2021  Medication Sig   amLODipine (NORVASC) 2.5 MG tablet Take 1 tablet (2.5 mg total) by mouth daily.   bimatoprost (LUMIGAN) 0.01 % SOLN Place 1 drop into both eyes at bedtime.   Cholecalciferol (VITAMIN D3) 50 MCG (2000 UT) capsule Take 2,000 Units by mouth 4 (four) times a week. Tuesday, Thursday, Saturday and Sunday   ferrous sulfate 325 (65 FE) MG tablet Take 325 mg by mouth daily with breakfast.   fluticasone (FLONASE) 50 MCG/ACT nasal spray Place 2 sprays into the nose daily.   fluticasone furoate-vilanterol (BREO ELLIPTA) 100-25 MCG/ACT AEPB Inhale 1 puff into the lungs daily.   fluticasone furoate-vilanterol (BREO ELLIPTA) 200-25 MCG/ACT AEPB Inhale 1 puff into the lungs daily.   gabapentin (NEURONTIN) 300 MG capsule Take 300 mg by mouth 2 (two) times daily.   HYDROcodone-acetaminophen (NORCO/VICODIN) 5-325 MG tablet Take 1 tablet by mouth every 6 (six) hours as needed for severe pain.   hydrOXYzine (ATARAX/VISTARIL) 25 MG tablet Take 1 tablet (25 mg total) by mouth 3 (three) times daily as needed for anxiety. (Patient taking differently: Take 25 mg by mouth 3 (three) times daily.)   levothyroxine (SYNTHROID) 150 MCG tablet Take  150 mcg by mouth daily.   Multiple Vitamin (MULTIVITAMIN) capsule Take 1 capsule by mouth daily.   pantoprazole (PROTONIX) 40 MG tablet Take 1 tablet (40 mg total) by mouth 2 (two) times daily. (Patient taking differently: Take 40 mg by mouth daily.)   Probiotic Product (ALIGN) 4 MG CAPS Take 1 capsule by mouth daily.   rOPINIRole (REQUIP) 2 MG tablet Take 1 tablet (2 mg total) by mouth at bedtime as needed (restless legs). (Patient taking differently: Take 2 mg by mouth in the morning and at bedtime.)   sertraline (ZOLOFT) 25 MG tablet Take 1 tablet (25 mg total) by mouth daily.   DULoxetine (CYMBALTA) 20 MG capsule Take 1 capsule (20 mg total) by mouth daily for 2 days.   DULoxetine 40 MG CPEP Take 40 mg by mouth daily for 1 day.   [DISCONTINUED] levothyroxine (SYNTHROID) 125 MCG tablet Take 100 mcg by mouth daily before breakfast.    No facility-administered encounter medications  on file as of 01/14/2021.     Review of Systems  Review of Systems  No chest pain with exertion.  No orthopnea or PND.  Comprehensive review of systems otherwise negative. Physical Exam  BP 118/84   Pulse 82   Ht 5\' 5"  (1.651 m)   Wt 218 lb 6.4 oz (99.1 kg)   SpO2 97% Comment: on RA  BMI 36.34 kg/m   Wt Readings from Last 5 Encounters:  01/14/21 218 lb 6.4 oz (99.1 kg)  11/23/20 217 lb (98.4 kg)  10/04/19 190 lb 11.2 oz (86.5 kg)  08/14/18 190 lb 3.2 oz (86.3 kg)  06/21/18 191 lb (86.6 kg)    BMI Readings from Last 5 Encounters:  01/14/21 36.34 kg/m  11/23/20 36.11 kg/m  10/04/19 30.78 kg/m  08/14/18 30.70 kg/m  06/21/18 30.83 kg/m     Physical Exam General: Well-appearing, sitting in exam chair, Eyes: EOMI, no icterus Neck: Supple, no JVP Cardiovascular: Regular rate and rhythm, no murmur Pulmonary: Clear, good excursion, normal work of breathing Abdomen: Nondistended, bowel sounds present MSK: No synovitis, no joint effusion Neuro: Normal gait, no weakness Psych: Normal mood,  full affect   Assessment & Plan:   Dyspnea on exertion: Suspect multifactorial.  Weight has increased over the last year or so.  Less active, deconditioning likely playing a role.  Possible cardiac opponent given prior abnormal TTE 2020 with elevated left atrial pressure, some focal wall motion deficits although subsequent nuclear medicine scan was reportedly normal.  CT scan demonstrates mild bibasilar bronchiectasis, mildly thickened bronchials throughout.  Possibly chronic bronchitis versus asthma.  Will obtain PFTs for further evaluation.  New prescription for [facility] today as therapeutic trial.  Bronchiectasis: Mild, bibasilar.  Likely reflective of chronic GERD versus aspiration.  Denies frank aspiration symptoms.  No dysphagia.  Continue to monitor.   Return in about 3 months (around 04/16/2021).   Lanier Clam, MD 01/19/2021

## 2021-02-19 DIAGNOSIS — I129 Hypertensive chronic kidney disease with stage 1 through stage 4 chronic kidney disease, or unspecified chronic kidney disease: Secondary | ICD-10-CM | POA: Diagnosis not present

## 2021-02-19 DIAGNOSIS — G2581 Restless legs syndrome: Secondary | ICD-10-CM | POA: Diagnosis not present

## 2021-02-19 DIAGNOSIS — N184 Chronic kidney disease, stage 4 (severe): Secondary | ICD-10-CM | POA: Diagnosis not present

## 2021-02-19 DIAGNOSIS — K219 Gastro-esophageal reflux disease without esophagitis: Secondary | ICD-10-CM | POA: Diagnosis not present

## 2021-02-19 DIAGNOSIS — M1991 Primary osteoarthritis, unspecified site: Secondary | ICD-10-CM | POA: Diagnosis not present

## 2021-02-19 DIAGNOSIS — R52 Pain, unspecified: Secondary | ICD-10-CM | POA: Diagnosis not present

## 2021-02-19 DIAGNOSIS — E039 Hypothyroidism, unspecified: Secondary | ICD-10-CM | POA: Diagnosis not present

## 2021-02-19 DIAGNOSIS — F5101 Primary insomnia: Secondary | ICD-10-CM | POA: Diagnosis not present

## 2021-02-19 DIAGNOSIS — L299 Pruritus, unspecified: Secondary | ICD-10-CM | POA: Diagnosis not present

## 2021-02-19 DIAGNOSIS — R0602 Shortness of breath: Secondary | ICD-10-CM | POA: Diagnosis not present

## 2021-03-18 ENCOUNTER — Telehealth: Payer: Self-pay | Admitting: Pulmonary Disease

## 2021-03-18 NOTE — Telephone Encounter (Signed)
ATC patient-unable to leave vm due to mailbox being full.  Will call back.  

## 2021-03-24 DIAGNOSIS — C672 Malignant neoplasm of lateral wall of bladder: Secondary | ICD-10-CM | POA: Diagnosis not present

## 2021-03-24 DIAGNOSIS — Z936 Other artificial openings of urinary tract status: Secondary | ICD-10-CM | POA: Diagnosis not present

## 2021-03-27 DIAGNOSIS — Z936 Other artificial openings of urinary tract status: Secondary | ICD-10-CM | POA: Diagnosis not present

## 2021-04-19 ENCOUNTER — Ambulatory Visit (INDEPENDENT_AMBULATORY_CARE_PROVIDER_SITE_OTHER): Payer: Medicare Other | Admitting: Pulmonary Disease

## 2021-04-19 ENCOUNTER — Other Ambulatory Visit: Payer: Self-pay

## 2021-04-19 ENCOUNTER — Ambulatory Visit: Payer: Medicare Other | Admitting: Pulmonary Disease

## 2021-04-19 ENCOUNTER — Encounter: Payer: Self-pay | Admitting: Pulmonary Disease

## 2021-04-19 VITALS — BP 130/72 | HR 82 | Temp 97.9°F | Ht 66.0 in | Wt 218.0 lb

## 2021-04-19 DIAGNOSIS — R0609 Other forms of dyspnea: Secondary | ICD-10-CM

## 2021-04-19 DIAGNOSIS — J479 Bronchiectasis, uncomplicated: Secondary | ICD-10-CM

## 2021-04-19 DIAGNOSIS — R2681 Unsteadiness on feet: Secondary | ICD-10-CM | POA: Diagnosis not present

## 2021-04-19 LAB — PULMONARY FUNCTION TEST
DL/VA % pred: 129 %
DL/VA: 5.33 ml/min/mmHg/L
DLCO cor % pred: 86 %
DLCO cor: 17.49 ml/min/mmHg
DLCO unc % pred: 86 %
DLCO unc: 17.49 ml/min/mmHg
FEF 25-75 Post: 0.7 L/sec
FEF 25-75 Pre: 1.1 L/sec
FEF2575-%Change-Post: -36 %
FEF2575-%Pred-Post: 34 %
FEF2575-%Pred-Pre: 54 %
FEV1-%Change-Post: -26 %
FEV1-%Pred-Post: 51 %
FEV1-%Pred-Pre: 70 %
FEV1-Post: 1.24 L
FEV1-Pre: 1.69 L
FEV1FVC-%Change-Post: -15 %
FEV1FVC-%Pred-Pre: 96 %
FEV6-%Change-Post: -13 %
FEV6-%Pred-Post: 66 %
FEV6-%Pred-Pre: 75 %
FEV6-Post: 2 L
FEV6-Pre: 2.29 L
FEV6FVC-%Pred-Post: 104 %
FEV6FVC-%Pred-Pre: 104 %
FVC-%Change-Post: -12 %
FVC-%Pred-Post: 63 %
FVC-%Pred-Pre: 72 %
FVC-Post: 2 L
FVC-Pre: 2.29 L
Post FEV1/FVC ratio: 62 %
Post FEV6/FVC ratio: 100 %
Pre FEV1/FVC ratio: 73 %
Pre FEV6/FVC Ratio: 100 %

## 2021-04-19 MED ORDER — TRELEGY ELLIPTA 200-62.5-25 MCG/ACT IN AEPB: 1.0000 | INHALATION_SPRAY | Freq: Every day | RESPIRATORY_TRACT | 11 refills | Status: DC

## 2021-04-19 NOTE — Patient Instructions (Signed)
Nice to see you again  I ordered a CT scan of the chest as well as a heart ultrasound to continue looking for reasons why you are short of breath  I also ordered physical therapy to come to where you live to work with you occasionally and give you exercises to work on increasing your strength particularly with your legs.  I do think this will also ultimately help with your shortness of breath.  Stop Breo, use Trelegy 1 puff daily.  Rinse your mouth out after every use.  Return to clinic in 3 months or sooner as needed

## 2021-04-19 NOTE — Patient Instructions (Signed)
Full PFT performed today. °

## 2021-04-19 NOTE — Progress Notes (Signed)
Full PFT performed today. °

## 2021-04-20 ENCOUNTER — Telehealth: Payer: Self-pay | Admitting: Pulmonary Disease

## 2021-04-20 ENCOUNTER — Other Ambulatory Visit: Payer: Self-pay

## 2021-04-20 DIAGNOSIS — D631 Anemia in chronic kidney disease: Secondary | ICD-10-CM | POA: Diagnosis not present

## 2021-04-20 DIAGNOSIS — C7911 Secondary malignant neoplasm of bladder: Secondary | ICD-10-CM | POA: Diagnosis not present

## 2021-04-20 DIAGNOSIS — N184 Chronic kidney disease, stage 4 (severe): Secondary | ICD-10-CM | POA: Diagnosis not present

## 2021-04-20 DIAGNOSIS — N2581 Secondary hyperparathyroidism of renal origin: Secondary | ICD-10-CM | POA: Diagnosis not present

## 2021-04-20 DIAGNOSIS — I129 Hypertensive chronic kidney disease with stage 1 through stage 4 chronic kidney disease, or unspecified chronic kidney disease: Secondary | ICD-10-CM | POA: Diagnosis not present

## 2021-04-20 MED ORDER — TRELEGY ELLIPTA 200-62.5-25 MCG/ACT IN AEPB: 1.0000 | INHALATION_SPRAY | Freq: Every day | RESPIRATORY_TRACT | 2 refills | Status: DC

## 2021-04-20 NOTE — Telephone Encounter (Signed)
Routing to April for f/u

## 2021-04-22 ENCOUNTER — Other Ambulatory Visit: Payer: Self-pay

## 2021-04-22 ENCOUNTER — Ambulatory Visit (INDEPENDENT_AMBULATORY_CARE_PROVIDER_SITE_OTHER): Payer: Medicare Other

## 2021-04-22 ENCOUNTER — Ambulatory Visit (HOSPITAL_BASED_OUTPATIENT_CLINIC_OR_DEPARTMENT_OTHER)
Admission: RE | Admit: 2021-04-22 | Discharge: 2021-04-22 | Disposition: A | Discharge status: Home / Self Care | Payer: Medicare Other | Source: Ambulatory Visit | Attending: Pulmonary Disease | Admitting: Pulmonary Disease

## 2021-04-22 DIAGNOSIS — R0609 Other forms of dyspnea: Secondary | ICD-10-CM | POA: Diagnosis not present

## 2021-04-22 DIAGNOSIS — J479 Bronchiectasis, uncomplicated: Secondary | ICD-10-CM | POA: Diagnosis not present

## 2021-04-22 DIAGNOSIS — R0602 Shortness of breath: Secondary | ICD-10-CM | POA: Diagnosis not present

## 2021-04-22 DIAGNOSIS — R911 Solitary pulmonary nodule: Secondary | ICD-10-CM | POA: Diagnosis not present

## 2021-04-22 DIAGNOSIS — J9811 Atelectasis: Secondary | ICD-10-CM | POA: Diagnosis not present

## 2021-04-22 LAB — ECHOCARDIOGRAM COMPLETE
AR max vel: 2.74 cm2
AV Area VTI: 2.59 cm2
AV Area mean vel: 2.63 cm2
AV Mean grad: 8 mmHg
AV Peak grad: 13.1 mmHg
Ao pk vel: 1.81 m/s
Area-P 1/2: 3.83 cm2
S' Lateral: 3.26 cm

## 2021-04-22 MED ORDER — PERFLUTREN LIPID MICROSPHERE
1.0000 mL | INTRAVENOUS | Status: AC | PRN
  Administered 2021-04-22: 2 mL via INTRAVENOUS

## 2021-04-22 NOTE — Progress Notes (Unsigned)
DEFINITY LOT [id number] EXP 12/19/2021

## 2021-04-29 ENCOUNTER — Telehealth: Payer: Self-pay

## 2021-04-29 NOTE — Telephone Encounter (Signed)
Attempted to contact patient to schedule a Palliative Care consult appointment. No answer left a message to return call.  

## 2021-05-01 ENCOUNTER — Encounter: Payer: Self-pay | Admitting: Pulmonary Disease

## 2021-05-03 ENCOUNTER — Encounter: Payer: Self-pay | Admitting: Pulmonary Disease

## 2021-05-03 ENCOUNTER — Telehealth: Payer: Self-pay

## 2021-05-03 DIAGNOSIS — R931 Abnormal findings on diagnostic imaging of heart and coronary circulation: Secondary | ICD-10-CM

## 2021-05-03 NOTE — Telephone Encounter (Signed)
I faxed referral to [facility] on 2/7.  I called today and spoke to [place].  She states they can take pt & they tried to call her over the weekend and didn't get an answer.  They will try her again today.  Pt can call them at [phone] if she would like to go ahead and call them.  Will route message back to triage so they can make pt aware thru [place].

## 2021-05-03 NOTE — Telephone Encounter (Signed)
Spoke with patient and scheduled a Mychart Palliative Consult for 05/07/21 @ 1PM.  Consent obtained; updated Outlook/Netsmart/Team List and Epic.

## 2021-05-03 NOTE — Telephone Encounter (Signed)
Please advise 

## 2021-05-03 NOTE — Telephone Encounter (Signed)
Dr. Silas Flood, pt is requesting PFT results. Thanks.

## 2021-05-05 DIAGNOSIS — H401131 Primary open-angle glaucoma, bilateral, mild stage: Secondary | ICD-10-CM | POA: Diagnosis not present

## 2021-05-05 NOTE — Telephone Encounter (Signed)
Placed cardiology  referral to [facility], MD

## 2021-05-07 ENCOUNTER — Telehealth: Payer: Medicare Other | Admitting: Family Medicine

## 2021-05-08 DIAGNOSIS — R2681 Unsteadiness on feet: Secondary | ICD-10-CM | POA: Diagnosis not present

## 2021-05-08 DIAGNOSIS — J479 Bronchiectasis, uncomplicated: Secondary | ICD-10-CM | POA: Diagnosis not present

## 2021-05-08 DIAGNOSIS — R0609 Other forms of dyspnea: Secondary | ICD-10-CM | POA: Diagnosis not present

## 2021-05-08 DIAGNOSIS — Z936 Other artificial openings of urinary tract status: Secondary | ICD-10-CM | POA: Diagnosis not present

## 2021-05-10 ENCOUNTER — Other Ambulatory Visit: Payer: Self-pay

## 2021-05-10 ENCOUNTER — Telehealth: Payer: Medicare Other | Admitting: Family Medicine

## 2021-05-10 ENCOUNTER — Encounter: Payer: Self-pay | Admitting: Family Medicine

## 2021-05-10 VITALS — Wt 218.0 lb

## 2021-05-10 DIAGNOSIS — R112 Nausea with vomiting, unspecified: Secondary | ICD-10-CM | POA: Diagnosis not present

## 2021-05-10 DIAGNOSIS — R079 Chest pain, unspecified: Secondary | ICD-10-CM | POA: Diagnosis not present

## 2021-05-10 DIAGNOSIS — Z8551 Personal history of malignant neoplasm of bladder: Secondary | ICD-10-CM | POA: Diagnosis not present

## 2021-05-10 DIAGNOSIS — Z515 Encounter for palliative care: Secondary | ICD-10-CM | POA: Insufficient documentation

## 2021-05-10 NOTE — Progress Notes (Signed)
[facility] Consult Note Telephone: [phone]  Fax: [phone]   Date of encounter: 05/10/21 3:03 PM PATIENT NAME: Shelia Cruz [employer] [address] [place] [address]   [phone] (home)  DOB: June 10, 1950 MRN: [id number] PRIMARY CARE PROVIDER:    Kathyrn Lass, MD,  [place] [address] [phone]  REFERRING PROVIDER:   Kathyrn Lass, [place] [address] [place],  [place] [address] [phone]  RESPONSIBLE PARTY:    Contact Information     Name Relation Home Work Mobile   Cruz,Shelia Daughter [phone]  [phone]   Shelia Cruz Daughter [phone]  [phone]        I connected with  Shelia Cruz on 05/10/21 by a video enabled telemedicine application and verified that I am speaking with the correct person using two identifiers.   I discussed the limitations of evaluation and management by telemedicine. The patient expressed understanding and agreed to proceed.          ASSESSMENT, SYMPTOM MANAGEMENT AND PLAN / RECOMMENDATIONS:   Palliative Care Encounter Has DNR, will address MOST at follow up visit.     Chest Pain Recent echo 04/22/21 shows low normal EF 50-55% with LV wall motion abnormalities-  mild hypokinesis of the left ventricular, basal-mid  inferoseptal wall and inferior wall and grade I diastolic dysfunction.  Recent EKG shows junctional rhythm. Symptoms concerning for angina, has follow up with Cardiology 05/31/21.   Follow up with Cardiology to see about moving forward appt or advisability of prescribing Imdur. If pt has dyspnea with chest pressure or nausea and vomiting, call 911. Avoid exertion or any activity which worsens pain.   Nausea and vomiting, unspecified Question if related to cardiac etiology. Has had cholecystectomy. Continue Zofran prn   Hx of bladder cancer Due to have replacement of stent.   Follow up Palliative  Care Visit: Palliative care will continue to follow for complex medical decision making, advance care planning, and clarification of goals. Return 1 week or prn.    This visit was coded based on medical decision making (MDM).  PPS: 50%  HOSPICE ELIGIBILITY/DIAGNOSIS: TBD  Chief Complaint:  Shelia Cruz received a referral to follow up with patient for chronic disease management, advance directive and defining/refining goals of care. Pt c/o "almost constant" chest pain, exertional dyspnea and orthopnea.  Intermittent nausea and vomiting unrelated to intake or lack of intake.  HISTORY OF PRESENT ILLNESS:  Shelia Cruz is a 71 y.o. year old female with hx of bladder and left kidney cancers, who has hypertension, chest pain, dyspnea on exertion and intermittent nausea and vomiting as well as CKD stage IV.  Pt states her chest pain is "almost constant" and doesn't go away.  She has some exertional dyspnea but O2 sats have been good.  Recently saw Dr Silas Flood who had a chest CT done with no acute findings to explain dyspnea, some PA dilatation consistent with PAH.  Pt states although her renal function is better, "I feel worse." On 11/23/20 her Cr was 2.55 with eGFR 20.  Her O2 sats are running 94%.  She states she uses a cane in her apartment but when going out she uses a rollator.  She has had a PT evaluation through [facility] to help strengthen her legs.  Her daughter Shelia Cruz lives in [place] and is her [facility] POA.  She has a DNR but has not done a MOST.  History obtained  from review of EMR, discussion with [name].  I reviewed available labs, medications, imaging, studies and related documents from the EMR.  Records reviewed and summarized above.   ROS General: NAD EYES: denies vision changes ENMT: denies dysphagia Cardiovascular: endorses chest pain and DOE Pulmonary: denies cough, endorses orthopnea, no PND Abdomen: endorses good appetite, denies  constipation, endorses continence of bowel MSK:  endorses increased  LE weakness, no falls reported Skin: denies rashes or wounds Neurological: denies insomnia Psych: Endorses positive mood Heme/lymph/immuno: denies bruises, abnormal bleeding  Physical Exam: Constitutional: NAD General: ill appearing, obese  EYES: anicteric sclera, lids intact, no discharge  ENMT: intact hearing CV:  no visible cyanosis, mild dyspnea with talking Pulmonary: no cough, room air, dyspnea with prolonged talking MSK: no sarcopenia, moves all extremities, ambulatory Neuro: no cognitive impairment Psych: non-anxious affect, A and O x 3 Hem/lymph/immuno: no widespread bruising  CURRENT PROBLEM LIST:  Patient Active Problem List   Diagnosis Date Noted   AKI (acute kidney injury) ([facility]) 09/30/2019   Hyperkalemia 09/30/2019   History of bladder cancer 09/30/2019   History of left nephrectomy 09/30/2019   History of total cystectomy 30/86/5784   Metabolic acidosis 69/62/9528   Hemoptysis 09/30/2019   Hypothyroidism 09/30/2019   Restless legs syndrome 09/30/2019   Ureteral obstruction 09/30/2019   Acute blood loss anemia 09/30/2019   Chest tightness 08/13/2018   Hypoxia 08/13/2018   Hypomagnesemia 08/13/2018   Cancer (HCC)    Essential hypertension    GERD (gastroesophageal reflux disease)    PAST MEDICAL HISTORY:  Active Ambulatory Problems    Diagnosis Date Noted   Chest tightness 08/13/2018   Hypoxia 08/13/2018   Cancer ([facility])    Hypomagnesemia 08/13/2018   Essential hypertension    GERD (gastroesophageal reflux disease)    AKI (acute kidney injury) ([facility]) 09/30/2019   Hyperkalemia 09/30/2019   History of bladder cancer 09/30/2019   History of left nephrectomy 09/30/2019   History of total cystectomy 41/32/4401   Metabolic acidosis 02/72/5366   Hemoptysis 09/30/2019   Hypothyroidism 09/30/2019   Restless legs syndrome 09/30/2019   Ureteral obstruction 09/30/2019   Acute blood loss anemia  09/30/2019   Resolved Ambulatory Problems    Diagnosis Date Noted   No Resolved Ambulatory Problems   Past Medical History:  Diagnosis Date   Hypertension    Pancreatic mass    Renal disorder    Thyroid disease    SOCIAL HX:  Social History   Tobacco Use   Smoking status: Never   Smokeless tobacco: Never  Substance Use Topics   Alcohol use: No   FAMILY HX:  Family History  Problem Relation Age of Onset   Hypertension Mother    Diabetes Other        Preferred Pharmacy: ALLERGIES:  Allergies  Allergen Reactions   Iodinated Contrast Media Other (See Comments)    Was told to avoid after being diagnosed with stage 3 kidney disease Was told to avoid after being diagnosed with stage 3 kidney disease    Sulfamethoxazole-Trimethoprim Swelling    Facial swelling and rash   Ciprofloxacin Nausea And Vomiting   Sulfacetamide Sodium Swelling     PERTINENT MEDICATIONS:  Outpatient Encounter Medications as of 05/10/2021  Medication Sig   amLODipine (NORVASC) 2.5 MG tablet Take 1 tablet (2.5 mg total) by mouth daily.   bimatoprost (LUMIGAN) 0.01 % SOLN Place 1 drop into both eyes at bedtime.   fluticasone (FLONASE) 50 MCG/ACT nasal spray Place 2 sprays into the  nose daily.   Fluticasone-Umeclidin-Vilant (TRELEGY ELLIPTA) 200-62.5-25 MCG/ACT AEPB Inhale 1 puff into the lungs daily.   Fluticasone-Umeclidin-Vilant (TRELEGY ELLIPTA) 200-62.5-25 MCG/ACT AEPB Inhale 1 puff into the lungs daily.   gabapentin (NEURONTIN) 300 MG capsule Take 300 mg by mouth 2 (two) times daily.   levothyroxine (SYNTHROID) 150 MCG tablet Take 150 mcg by mouth daily.   pantoprazole (PROTONIX) 40 MG tablet Take 1 tablet (40 mg total) by mouth 2 (two) times daily. (Patient taking differently: Take 40 mg by mouth daily.)   Probiotic Product (ALIGN) 4 MG CAPS Take 1 capsule by mouth daily.   rOPINIRole (REQUIP) 2 MG tablet Take 1 tablet (2 mg total) by mouth at bedtime as needed (restless legs). (Patient taking  differently: Take 2 mg by mouth in the morning and at bedtime.)   sertraline (ZOLOFT) 50 MG tablet Take 50 mg by mouth daily.   traMADol (ULTRAM) 50 MG tablet Take 50 mg by mouth 2 (two) times daily as needed.   No facility-administered encounter medications on file as of 05/10/2021.     ------------------------------------------------------------------------------------------------------------------------------------------------------------------------------------------------------------------------------ Advance Care Planning/Goals of Care: Goals include to maximize quality of life and symptom management. Patient gave her permission to discuss.Our advance care planning conversation included a discussion about:    The value and importance of advance care planning  Exploration of goals of care in the event of a sudden injury or illness-DNR in place Identification  of a healthcare agent -[facility] POA-daughter Shelia Cruz Review of advance directive document-MOST, will complete at pt's discretion at follow up visit. Decision not to resuscitate or to de-escalate disease focused treatments due to poor prognosis. CODE STATUS: DNR   Thank you for the opportunity to participate in the care of Ms. Van.  The palliative care team will continue to follow. Please call our office at [phone] if we can be of additional assistance.   Marijo Conception, FNP-C  COVID-19 PATIENT SCREENING TOOL Asked and negative response unless otherwise noted:  Have you had symptoms of covid, tested positive or been in contact with someone with symptoms/positive test in the past 5-10 days? No

## 2021-05-11 ENCOUNTER — Telehealth: Payer: Self-pay

## 2021-05-11 NOTE — Telephone Encounter (Signed)
Left message for patient to call back. Holding DOD spot on Wednesday with Dr. Marlou Porch if patient can make that time and date.

## 2021-05-11 NOTE — Telephone Encounter (Signed)
Called pt to schedule next available DOD appt, no answer. Left message to call office to expedite this appointment due to her increasing CP. Will re-schedule upon return call.

## 2021-05-11 NOTE — Telephone Encounter (Signed)
----- Message from Donnalee Curry K sent at 05/11/2021 12:13 PM EST ----- Regarding: FW: Referral  ----- Message ----- From: Werner Lean, MD Sent: 05/11/2021   8:07 AM EST To: Marijo Conception, FNP, Cv Div [facility] Triage Subject: RE: Referral                                   Thanks so much for reaching out.  It sounds like this is a very complex patient.  I haven't had the pleasure of meeting her, but sounds like comorbidity of bladder cancer, kidney cancer and CKD would make things challenging from being able to offer her a procedural intervention.  Thankfully, reviewing her last chest CT, lack of coronary calcium at the age of 70 is optimistic for not having having significant obstructive CAD.  Given that, I'm not sure I can recommend Imdur purely through chart review.  I am cc'ing our triage team. Team: Can we book this patient in a DOD spot instead of with me 05/31/21 given her escalation of chest pain?  That way we could expedite her care.  She is unknown to [facility].  Thanks all,  MAC   ----- Message ----- From: Marijo Conception, FNP Sent: 05/10/2021   7:42 PM EST To: Werner Lean, MD Subject: Referral                                       Dr Gasper Sells, My name is Damaris Hippo and I am the nurse practitioner with [facility].  I have admitted this patient to Palliative Service as of today.  She has an initial appointment with you on March 13th.  I am concerned after talking with her today that she has some unstable angina and wanted to address with you.  She is c/o "almost constant" chest pain, has DOE and some orthopnea but no PND.  She was seen by Dr Silas Flood who sent her for chest CT and echo.  CT chest showed no acute findings to explain DOE but had dilated PA consistent with PAH. She has a hx of bladder and kidney cancer with stage IV CKD.  Prior to the last 2 EKGs done this month her last EKG was SR with PAC, last 2 this month showed  junctional rhythms.  Her echo had low normal EF 50-55% with regional wall motion abnormalities of mild hypokinesis of the left ventricular, basal-mid  inferoseptal wall and inferior wall with grade I diastolic dysfunction. In addition to the dyspnea she has intermittent nausea and vomiting and has had her gallbladder removed.  Nausea and vomiting is all different times of day or night and does not appear to relate to intake or lack thereof.  She is not on a beta blocker, has bronchiectasis and is on Amlodipine.  I am wondering if she needs to follow up with you more urgently or have a prescription for Imdur.  I am happy to help in any way I can to facilitate if you will let me know what you think.  She is DNR but does not have MOST which I will do with her next week.  If you are in agreement and think she would benefit from Imdur, let me know if you want me to order it.  Her BP is 120s/80s, pulse in low 70s  with O2 sats 94%.   Respectfully, Damaris Hippo FNP-C [facility] NP Cell [phone]

## 2021-05-11 NOTE — Telephone Encounter (Signed)
Patient returned call. I offered held slot Thursday, 2/23 with Dr. Marlou Porch, but patient declined. States she has another appointment scheduled at the same time.

## 2021-05-11 NOTE — Telephone Encounter (Signed)
-----   Message from Mahesh A Chandrasekhar, MD sent at 05/11/2021  8:00 AM EST ----- °Regarding: RE: Referral °Thanks so much for reaching out. ° °It sounds like this is a very complex patient. ° °I haven't had the pleasure of meeting her, but sounds like comorbidity of bladder cancer, kidney cancer and CKD would make things challenging from being able to offer her a procedural intervention.  Thankfully, reviewing her last chest CT, lack of coronary calcium at the age of 70 is optimistic for not having having significant obstructive CAD. ° °Given that, I'm not sure I can recommend Imdur purely through chart review. ° °I am cc'ing our triage team. °Team: Can we book this patient in a DOD spot instead of with me 05/31/21 given her escalation of chest pain?  That way we could expedite her care.  She is unknown to [facility]. ° °Thanks all,  °MAC  ° °----- Message ----- °From: Highfill, Karen A, FNP °Sent: 05/10/2021   7:42 PM EST °To: Mahesh A Chandrasekhar, MD °Subject: Referral                                      ° °Dr Chandrasekhar, °My name is Karen Highfill and I am the nurse practitioner with [facility].  I have admitted this patient to Palliative Service as of today.  She has an initial appointment with you on March 13th.  I am concerned after talking with her today that she has some unstable angina and wanted to address with you.  She is c/o "almost constant" chest pain, has DOE and some orthopnea but no PND.  She was seen by Dr Hunsucker who sent her for chest CT and echo.  CT chest showed no acute findings to explain DOE but had dilated PA consistent with PAH. She has a hx of bladder and kidney cancer with stage IV CKD.  Prior to the last 2 EKGs done this month her last EKG was SR with PAC, last 2 this month showed junctional rhythms.  Her echo had low normal EF 50-55% with regional wall motion abnormalities of mild hypokinesis of the left ventricular, basal-mid  °inferoseptal wall and inferior wall  with grade I diastolic dysfunction. In addition to the dyspnea she has intermittent nausea and vomiting and has had her gallbladder removed.  Nausea and vomiting is all different times of day or night and does not appear to relate to intake or lack thereof.  She is not on a beta blocker, has bronchiectasis and is on Amlodipine.  I am wondering if she needs to follow up with you more urgently or have a prescription for Imdur.  I am happy to help in any way I can to facilitate if you will let me know what you think.  She is DNR but does not have MOST which I will do with her next week.  If you are in agreement and think she would benefit from Imdur, let me know if you want me to order it.  Her BP is 120s/80s, pulse in low 70s with O2 sats 94%.   °Respectfully, °Karen Highfill FNP-C °[facility] NP °Cell [phone] ° ° °

## 2021-05-11 NOTE — Telephone Encounter (Signed)
Breo was changed to Trelegy by Dr. Silas Flood.

## 2021-05-11 NOTE — Telephone Encounter (Signed)
Patient has to depend on a ride service to get to appointments, so patient was give DOD spot on Friday.

## 2021-05-12 ENCOUNTER — Telehealth: Payer: Self-pay

## 2021-05-12 NOTE — Telephone Encounter (Signed)
Michaelyn Barter, RN      4:57 PM Note Patient has to depend on a ride service to get to appointments, so patient was give DOD spot on Friday.

## 2021-05-12 NOTE — Telephone Encounter (Signed)
-----   Message from Werner Lean, MD sent at 05/11/2021  8:00 AM EST ----- Regarding: RE: Referral Thanks so much for reaching out.  It sounds like this is a very complex patient.  I haven't had the pleasure of meeting her, but sounds like comorbidity of bladder cancer, kidney cancer and CKD would make things challenging from being able to offer her a procedural intervention.  Thankfully, reviewing her last chest CT, lack of coronary calcium at the age of 86 is optimistic for not having having significant obstructive CAD.  Given that, I'm not sure I can recommend Imdur purely through chart review.  I am cc'ing our triage team. Team: Can we book this patient in a DOD spot instead of with me 05/31/21 given her escalation of chest pain?  That way we could expedite her care.  She is unknown to [facility].  Thanks all,  MAC   ----- Message ----- From: Marijo Conception, FNP Sent: 05/10/2021   7:42 PM EST To: Werner Lean, MD Subject: Referral                                       Dr Gasper Sells, My name is Shelia Cruz and I am the nurse practitioner with [facility].  I have admitted this patient to Palliative Service as of today.  She has an initial appointment with you on March 13th.  I am concerned after talking with her today that she has some unstable angina and wanted to address with you.  She is c/o "almost constant" chest pain, has DOE and some orthopnea but no PND.  She was seen by Dr Silas Flood who sent her for chest CT and echo.  CT chest showed no acute findings to explain DOE but had dilated PA consistent with PAH. She has a hx of bladder and kidney cancer with stage IV CKD.  Prior to the last 2 EKGs done this month her last EKG was SR with PAC, last 2 this month showed junctional rhythms.  Her echo had low normal EF 50-55% with regional wall motion abnormalities of mild hypokinesis of the left ventricular, basal-mid  inferoseptal wall and inferior wall  with grade I diastolic dysfunction. In addition to the dyspnea she has intermittent nausea and vomiting and has had her gallbladder removed.  Nausea and vomiting is all different times of day or night and does not appear to relate to intake or lack thereof.  She is not on a beta blocker, has bronchiectasis and is on Amlodipine.  I am wondering if she needs to follow up with you more urgently or have a prescription for Imdur.  I am happy to help in any way I can to facilitate if you will let me know what you think.  She is DNR but does not have MOST which I will do with her next week.  If you are in agreement and think she would benefit from Imdur, let me know if you want me to order it.  Her BP is 120s/80s, pulse in low 70s with O2 sats 94%.   Respectfully, Shelia Hippo FNP-C [facility] NP Cell [phone]

## 2021-05-13 DIAGNOSIS — I129 Hypertensive chronic kidney disease with stage 1 through stage 4 chronic kidney disease, or unspecified chronic kidney disease: Secondary | ICD-10-CM | POA: Diagnosis not present

## 2021-05-13 DIAGNOSIS — R2681 Unsteadiness on feet: Secondary | ICD-10-CM | POA: Diagnosis not present

## 2021-05-13 DIAGNOSIS — J479 Bronchiectasis, uncomplicated: Secondary | ICD-10-CM | POA: Diagnosis not present

## 2021-05-13 DIAGNOSIS — Z0000 Encounter for general adult medical examination without abnormal findings: Secondary | ICD-10-CM | POA: Diagnosis not present

## 2021-05-13 DIAGNOSIS — R0609 Other forms of dyspnea: Secondary | ICD-10-CM | POA: Diagnosis not present

## 2021-05-13 DIAGNOSIS — Z79899 Other long term (current) drug therapy: Secondary | ICD-10-CM | POA: Diagnosis not present

## 2021-05-13 DIAGNOSIS — Z936 Other artificial openings of urinary tract status: Secondary | ICD-10-CM | POA: Diagnosis not present

## 2021-05-14 ENCOUNTER — Other Ambulatory Visit: Payer: Self-pay

## 2021-05-14 ENCOUNTER — Encounter: Payer: Self-pay | Admitting: Cardiovascular Disease

## 2021-05-14 ENCOUNTER — Ambulatory Visit: Payer: Medicare Other | Admitting: Cardiovascular Disease

## 2021-05-14 VITALS — BP 130/86 | HR 70 | Ht 65.0 in | Wt 225.2 lb

## 2021-05-14 DIAGNOSIS — R0602 Shortness of breath: Secondary | ICD-10-CM | POA: Diagnosis not present

## 2021-05-14 DIAGNOSIS — I2510 Atherosclerotic heart disease of native coronary artery without angina pectoris: Secondary | ICD-10-CM | POA: Diagnosis not present

## 2021-05-14 NOTE — Progress Notes (Signed)
Cardiology Office Note:    Date:  05/14/2021   ID:  Shelia Cruz, DOB [birth year], MRN [id number]  PCP:  Kathyrn Lass, MD   [facility] Providers Cardiologist:  None     Referring MD: Kathyrn Lass, MD   Chief Complaint  Patient presents with   Shortness of Breath    History of Present Illness:    Shelia Cruz is a 71 y.o. female presenting for evaluation of shortness of breath.   The patient was first diagnosed with bladder CA in 2017. She was initially treated with chemotherapy and reportedly tolerated fairly well. She then had renal CA in 2020 and had a left nephrectomy. She then required a big surgery in 2021 when her bladder was removed and she extensive surgery at that time.  She reports poor health ever since then.  She is here today for evaluation of severe dyspnea. She has undergone CT studies and an echocardiogram.  The patient's chest CT has demonstrated mild bibasilar bronchiectasis without significant change over time.  An echocardiogram demonstrated an LVEF of 50 to 55% with hypokinesis of the inferoseptal and inferior walls, also stable over time my review of serial echo studies.  RV function is normal.  There is no significant valvular disease.  An updated CT scan of the chest showed mild dilatation of the central pulmonary artery which can be seen with pulmonary hypertension.  Otherwise no significant findings are identified.  The patient denies chest pain or chest pressure.  She complains of orthopnea and marked exertional dyspnea.  She has a chronic cough.  She has gained a lot of weight over the past few years.  No heart palpitations.  Past Medical History:  Diagnosis Date   Bladder cancer ([place])    bladder cancer   GERD (gastroesophageal reflux disease)    Hypertension    Pancreatic mass    Renal disorder    Thyroid disease     Past Surgical History:  Procedure Laterality Date   ABDOMINAL HYSTERECTOMY     BREAST BIOPSY Left    benign    CHOLECYSTECTOMY     CYSTOSCOPY     IR EXT NEPHROURETERAL CATH EXCHANGE  09/30/2019   KIDNEY SURGERY     NASAL SEPTUM SURGERY     REPLACEMENT TOTAL KNEE BILATERAL      Current Medications: Current Meds  Medication Sig   amLODipine (NORVASC) 2.5 MG tablet Take 1 tablet (2.5 mg total) by mouth daily.   bimatoprost (LUMIGAN) 0.01 % SOLN Place 1 drop into both eyes at bedtime.   fluticasone (FLONASE) 50 MCG/ACT nasal spray Place 2 sprays into the nose daily.   Fluticasone-Umeclidin-Vilant (TRELEGY ELLIPTA) 200-62.5-25 MCG/ACT AEPB Inhale 1 puff into the lungs daily.   gabapentin (NEURONTIN) 300 MG capsule Take 300 mg by mouth 2 (two) times daily.   levothyroxine (SYNTHROID) 150 MCG tablet Take 150 mcg by mouth daily.   pantoprazole (PROTONIX) 40 MG tablet Take 1 tablet (40 mg total) by mouth 2 (two) times daily. (Patient taking differently: Take 40 mg by mouth daily.)   Probiotic Product (ALIGN) 4 MG CAPS Take 1 capsule by mouth daily.   rOPINIRole (REQUIP) 2 MG tablet Take 1 tablet (2 mg total) by mouth at bedtime as needed (restless legs). (Patient taking differently: Take 2 mg by mouth in the morning and at bedtime.)   sertraline (ZOLOFT) 50 MG tablet Take 50 mg by mouth daily.   traMADol (ULTRAM) 50 MG tablet Take 50 mg by mouth 2 (  two) times daily as needed.     Allergies:   Iodinated contrast media, Sulfamethoxazole-trimethoprim, Ciprofloxacin, and Sulfacetamide sodium   Social History   Socioeconomic History   Marital status: Single    Spouse name: Not on file   Number of children: Not on file   Years of education: Not on file   Highest education level: Not on file  Occupational History   Not on file  Tobacco Use   Smoking status: Never   Smokeless tobacco: Never  Vaping Use   Vaping Use: Never used  Substance and Sexual Activity   Alcohol use: No   Drug use: No   Sexual activity: Not on file  Other Topics Concern   Not on file  Social History Narrative   Not on  file   Social Determinants of Health   Financial Resource Strain: Not on file  Food Insecurity: Not on file  Transportation Needs: Not on file  Physical Activity: Not on file  Stress: Not on file  Social Connections: Not on file     Family History: The patient's family history includes Diabetes in an other family member; Hypertension in her mother.  ROS:   Please see the history of present illness.    All other systems reviewed and are negative.  EKGs/Labs/Other Studies Reviewed:    The following studies were reviewed today: Echo 04/22/21: 1. Left ventricular ejection fraction, by estimation, is 50 to 55%. The  left ventricle has low normal function. The left ventricle demonstrates  regional wall motion abnormalities (see scoring diagram/findings for  description). There is mild concentric  left ventricular hypertrophy. Left ventricular diastolic parameters are  consistent with Grade I diastolic dysfunction (impaired relaxation).  Elevated left ventricular end-diastolic pressure. There is mild  hypokinesis of the left ventricular, basal-mid  inferoseptal wall and inferior wall.   2. Right ventricular systolic function is normal. The right ventricular  size is normal. There is normal pulmonary artery systolic pressure.   3. Left atrial size was severely dilated.   4. The mitral valve is normal in structure. No evidence of mitral valve  regurgitation. No evidence of mitral stenosis.   5. The aortic valve is tricuspid. Aortic valve regurgitation is not  visualized. No aortic stenosis is present.   6. The inferior vena cava is dilated in size with >50% respiratory  variability, suggesting right atrial pressure of 8 mmHg.   Comparison(s): EF 50%, LV moderate hypokinesis of the left ventricular,  basal-mid inferoseptal wall and inferior wall.   CT Chest: IMPRESSION: 1. No acute findings or explanation for shortness of breath. No bronchiectasis. 2. Mild dilatation of the  central pulmonary artery, can be seen with pulmonary arterial hypertension. 3. There is scattered tiny punctate nodules throughout the right lung. These are all unchanged from 2020 chest CT and considered benign.  EKG:  EKG is ordered today.  The ekg ordered today demonstrates normal sinus rhythm 70 bpm, within normal limits.  Recent Labs: 11/23/2020: ALT 16; BUN 37; Creatinine, Ser 2.55; Hemoglobin 15.2; Platelets 409; Potassium 3.9; Sodium 143  Recent Lipid Panel    Component Value Date/Time   CHOL 142 08/13/2018 0316   TRIG 51 08/13/2018 0316   HDL 53 08/13/2018 0316   CHOLHDL 2.7 08/13/2018 0316   VLDL 10 08/13/2018 0316   LDLCALC 79 08/13/2018 0316     Risk Assessment/Calculations:           Physical Exam:    VS:  BP 130/86    Pulse  70    Ht 5\' 5"  (1.651 m)    Wt 225 lb 3.2 oz (102.2 kg)    SpO2 95%    BMI 37.48 kg/m     Wt Readings from Last 3 Encounters:  05/14/21 225 lb 3.2 oz (102.2 kg)  05/10/21 218 lb (98.9 kg)  04/19/21 218 lb (98.9 kg)     GEN:  Well nourished, well developed in no acute distress HEENT: Normal NECK: No JVD; No carotid bruits LYMPHATICS: No lymphadenopathy CARDIAC: RRR, no murmurs, rubs, gallops RESPIRATORY:  Clear to auscultation without rales, wheezing or rhonchi  ABDOMEN: Soft, non-tender, non-distended MUSCULOSKELETAL:  No edema; No deformity  SKIN: Warm and dry NEUROLOGIC:  Alert and oriented x 3 PSYCHIATRIC:  Normal affect   ASSESSMENT:    1. Shortness of breath   2. Coronary artery disease involving native coronary artery of native heart without angina pectoris    PLAN:    In order of problems listed above:  2D echo and CT studies are reviewed.  No obvious explanation for the patient's shortness of breath and I suspect this is a multifactorial process.  Weight gain may be a significant component.  The CT CAT scan demonstrates a dilated main pulmonary artery which can be seen with pulmonary hypertension.  Because the  patient's progressive symptoms and marked functional limitation (NYHA functional class III), I have recommended a right heart catheterization to evaluate for pulmonary hypertension or severe LV diastolic dysfunction.  Risks, indications, and alternatives are reviewed with the patient.  She understands the risk of serious complication with right heart catheterization is extremely low.  While she probably does have coronary artery disease based on her segmental LV dysfunction seen on echo, I am not inclined to pursue left heart catheterization/coronary angiography.  The patient has advanced renal disease, she is not experiencing angina, and I think the risk/benefit for coronary angiography and/or PCI is unfavorable.  Hopefully assessment of her cardiac hemodynamics will help direct treatment. Patient with presumed CAD based on segmental wall motion abnormality on her echo study.  Multiple medical problems noted.  Favor conservative therapy at this point.      Shared Decision Making/Informed Consent The risks [stroke (1 in 1000), death (1 in 1000), kidney failure [usually temporary] (1 in 500), bleeding (1 in 200), allergic reaction [possibly serious] (1 in 200)], benefits (diagnostic support and management of coronary artery disease) and alternatives of a cardiac catheterization were discussed in detail with Shelia Cruz and she is willing to proceed.    Medication Adjustments/Labs and Tests Ordered: Current medicines are reviewed at length with the patient today.  Concerns regarding medicines are outlined above.  Orders Placed This Encounter  Procedures   EKG 12-Lead   No orders of the defined types were placed in this encounter.   Patient Instructions  Medication Instructions:  Your physician recommends that you continue on your current medications as directed. Please refer to the Current Medication list given to you today.  *If you need a refill on your cardiac medications before your next  appointment, please call your pharmacy*   Lab Work: None today If you have labs (blood work) drawn today and your tests are completely normal, you will receive your results only by: [place] (if you have MyChart) OR A paper copy in the mail If you have any lab test that is abnormal or we need to change your treatment, we will call you to review the results.   Testing/Procedures: R heart cath Your  physician has requested that you have a cardiac catheterization. Cardiac catheterization is used to diagnose and/or treat various heart conditions. Doctors may recommend this procedure for a number of different reasons. The most common reason is to evaluate chest pain. Chest pain can be a symptom of coronary artery disease (CAD), and cardiac catheterization can show whether plaque is narrowing or blocking your hearts arteries. This procedure is also used to evaluate the valves, as well as measure the blood flow and oxygen levels in different parts of your heart. For further information please visit HugeFiesta.tn. Please follow instruction sheet, as given.    Follow-Up: At [facility], you and your health needs are our priority.  As part of our continuing mission to provide you with exceptional heart care, we have created designated Provider Care Teams.  These Care Teams include your primary Cardiologist (physician) and Advanced Practice Providers (APPs -  Physician Assistants and Nurse Practitioners) who all work together to provide you with the care you need, when you need it.}    Other Instructions  [place] OFFICE [place], [place] [address] Dept: [place]: [phone]  Shelia Cruz  05/14/2021  You are scheduled for a Cardiac Catheterization on Thursday, May 20, 2021 with Dr. Daneen Schick.  1. Please arrive at the [facility] (Main Entrance A) at [facility]: [address] [place], [place] [address] at 9:00am (This time is two hours before your procedure to ensure your preparation). Free valet parking service is available.   Special note: Every effort is made to have your procedure done on time. Please understand that emergencies sometimes delay scheduled procedures.  2. Diet: Do not eat solid foods after midnight.  The patient may have clear liquids until 5am upon the day of the procedure.  3. Labs: CBC, BMET will be done prior to procedure  4. Medication instructions in preparation for your procedure:   Contrast Allergy: No  On the morning of your procedure, take Aspirin 81mg   5. Bring a current list of your medications and current insurance cards. 6. You MUST have a responsible person to drive you home. 7. Someone MUST be with you the first 24 hours after you arrive home or your discharge will be delayed. 8. Please wear clothes that are easy to get on and off and wear slip-on shoes.  Thank you for allowing [place] to care for you!   -- [facility] Invasive Cardiovascular services       Signed, Sherren Mocha, MD  05/14/2021 5:55 PM    [place]

## 2021-05-14 NOTE — Patient Instructions (Addendum)
Medication Instructions:  Your physician recommends that you continue on your current medications as directed. Please refer to the Current Medication list given to you today.  *If you need a refill on your cardiac medications before your next appointment, please call your pharmacy*   Lab Work: None today If you have labs (blood work) drawn today and your tests are completely normal, you will receive your results only by: [place] (if you have MyChart) OR A paper copy in the mail If you have any lab test that is abnormal or we need to change your treatment, we will call you to review the results.   Testing/Procedures: R heart cath Your physician has requested that you have a cardiac catheterization. Cardiac catheterization is used to diagnose and/or treat various heart conditions. Doctors may recommend this procedure for a number of different reasons. The most common reason is to evaluate chest pain. Chest pain can be a symptom of coronary artery disease (CAD), and cardiac catheterization can show whether plaque is narrowing or blocking your hearts arteries. This procedure is also used to evaluate the valves, as well as measure the blood flow and oxygen levels in different parts of your heart. For further information please visit HugeFiesta.tn. Please follow instruction sheet, as given.    Follow-Up: At [facility], you and your health needs are our priority.  As part of our continuing mission to provide you with exceptional heart care, we have created designated Provider Care Teams.  These Care Teams include your primary Cardiologist (physician) and Advanced Practice Providers (APPs -  Physician Assistants and Nurse Practitioners) who all work together to provide you with the care you need, when you need it.}    Other Instructions  [place] OFFICE [place], [place]  [address] Dept: [place]: [phone]  Shelia Cruz  05/14/2021  You are scheduled for a Cardiac Catheterization on Thursday, May 20, 2021 with Dr. Daneen Schick.  1. Please arrive at the [facility] (Main Entrance A) at [facility]: [address] [place], [place] [address] at 9:00am (This time is two hours before your procedure to ensure your preparation). Free valet parking service is available.   Special note: Every effort is made to have your procedure done on time. Please understand that emergencies sometimes delay scheduled procedures.  2. Diet: Do not eat solid foods after midnight.  The patient may have clear liquids until 5am upon the day of the procedure.  3. Labs: CBC, BMET will be done prior to procedure  4. Medication instructions in preparation for your procedure:   Contrast Allergy: No  On the morning of your procedure, take Aspirin 81mg   5. Bring a current list of your medications and current insurance cards. 6. You MUST have a responsible person to drive you home. 7. Someone MUST be with you the first 24 hours after you arrive home or your discharge will be delayed. 8. Please wear clothes that are easy to get on and off and wear slip-on shoes.  Thank you for allowing [place] to care for you!   -- [place] Invasive Cardiovascular services

## 2021-05-14 NOTE — H&P (View-Only) (Signed)
Cardiology Office Note:    Date:  05/14/2021   ID:  Shelia Cruz, DOB September 23, 1950, MRN [id number]  PCP:  Kathyrn Lass, MD   [facility] Providers Cardiologist:  None     Referring MD: Kathyrn Lass, MD   Chief Complaint  Patient presents with   Shortness of Breath    History of Present Illness:    Shelia Cruz is a 71 y.o. female presenting for evaluation of shortness of breath.   The patient was first diagnosed with bladder CA in 2017. She was initially treated with chemotherapy and reportedly tolerated fairly well. She then had renal CA in 2020 and had a left nephrectomy. She then required a big surgery in 2021 when her bladder was removed and she extensive surgery at that time.  She reports poor health ever since then.  She is here today for evaluation of severe dyspnea. She has undergone CT studies and an echocardiogram.  The patient's chest CT has demonstrated mild bibasilar bronchiectasis without significant change over time.  An echocardiogram demonstrated an LVEF of 50 to 55% with hypokinesis of the inferoseptal and inferior walls, also stable over time my review of serial echo studies.  RV function is normal.  There is no significant valvular disease.  An updated CT scan of the chest showed mild dilatation of the central pulmonary artery which can be seen with pulmonary hypertension.  Otherwise no significant findings are identified.  The patient denies chest pain or chest pressure.  She complains of orthopnea and marked exertional dyspnea.  She has a chronic cough.  She has gained a lot of weight over the past few years.  No heart palpitations.  Past Medical History:  Diagnosis Date   Bladder cancer ([place])    bladder cancer   GERD (gastroesophageal reflux disease)    Hypertension    Pancreatic mass    Renal disorder    Thyroid disease     Past Surgical History:  Procedure Laterality Date   ABDOMINAL HYSTERECTOMY     BREAST BIOPSY Left    benign    CHOLECYSTECTOMY     CYSTOSCOPY     IR EXT NEPHROURETERAL CATH EXCHANGE  09/30/2019   KIDNEY SURGERY     NASAL SEPTUM SURGERY     REPLACEMENT TOTAL KNEE BILATERAL      Current Medications: Current Meds  Medication Sig   amLODipine (NORVASC) 2.5 MG tablet Take 1 tablet (2.5 mg total) by mouth daily.   bimatoprost (LUMIGAN) 0.01 % SOLN Place 1 drop into both eyes at bedtime.   fluticasone (FLONASE) 50 MCG/ACT nasal spray Place 2 sprays into the nose daily.   Fluticasone-Umeclidin-Vilant (TRELEGY ELLIPTA) 200-62.5-25 MCG/ACT AEPB Inhale 1 puff into the lungs daily.   gabapentin (NEURONTIN) 300 MG capsule Take 300 mg by mouth 2 (two) times daily.   levothyroxine (SYNTHROID) 150 MCG tablet Take 150 mcg by mouth daily.   pantoprazole (PROTONIX) 40 MG tablet Take 1 tablet (40 mg total) by mouth 2 (two) times daily. (Patient taking differently: Take 40 mg by mouth daily.)   Probiotic Product (ALIGN) 4 MG CAPS Take 1 capsule by mouth daily.   rOPINIRole (REQUIP) 2 MG tablet Take 1 tablet (2 mg total) by mouth at bedtime as needed (restless legs). (Patient taking differently: Take 2 mg by mouth in the morning and at bedtime.)   sertraline (ZOLOFT) 50 MG tablet Take 50 mg by mouth daily.   traMADol (ULTRAM) 50 MG tablet Take 50 mg by mouth 2 (  two) times daily as needed.     Allergies:   Iodinated contrast media, Sulfamethoxazole-trimethoprim, Ciprofloxacin, and Sulfacetamide sodium   Social History   Socioeconomic History   Marital status: Single    Spouse name: Not on file   Number of children: Not on file   Years of education: Not on file   Highest education level: Not on file  Occupational History   Not on file  Tobacco Use   Smoking status: Never   Smokeless tobacco: Never  Vaping Use   Vaping Use: Never used  Substance and Sexual Activity   Alcohol use: No   Drug use: No   Sexual activity: Not on file  Other Topics Concern   Not on file  Social History Narrative   Not on  file   Social Determinants of Health   Financial Resource Strain: Not on file  Food Insecurity: Not on file  Transportation Needs: Not on file  Physical Activity: Not on file  Stress: Not on file  Social Connections: Not on file     Family History: The patient's family history includes Diabetes in an other family member; Hypertension in her mother.  ROS:   Please see the history of present illness.    All other systems reviewed and are negative.  EKGs/Labs/Other Studies Reviewed:    The following studies were reviewed today: Echo 04/22/21: 1. Left ventricular ejection fraction, by estimation, is 50 to 55%. The  left ventricle has low normal function. The left ventricle demonstrates  regional wall motion abnormalities (see scoring diagram/findings for  description). There is mild concentric  left ventricular hypertrophy. Left ventricular diastolic parameters are  consistent with Grade I diastolic dysfunction (impaired relaxation).  Elevated left ventricular end-diastolic pressure. There is mild  hypokinesis of the left ventricular, basal-mid  inferoseptal wall and inferior wall.   2. Right ventricular systolic function is normal. The right ventricular  size is normal. There is normal pulmonary artery systolic pressure.   3. Left atrial size was severely dilated.   4. The mitral valve is normal in structure. No evidence of mitral valve  regurgitation. No evidence of mitral stenosis.   5. The aortic valve is tricuspid. Aortic valve regurgitation is not  visualized. No aortic stenosis is present.   6. The inferior vena cava is dilated in size with >50% respiratory  variability, suggesting right atrial pressure of 8 mmHg.   Comparison(s): EF 50%, LV moderate hypokinesis of the left ventricular,  basal-mid inferoseptal wall and inferior wall.   CT Chest: IMPRESSION: 1. No acute findings or explanation for shortness of breath. No bronchiectasis. 2. Mild dilatation of the  central pulmonary artery, can be seen with pulmonary arterial hypertension. 3. There is scattered tiny punctate nodules throughout the right lung. These are all unchanged from 2020 chest CT and considered benign.  EKG:  EKG is ordered today.  The ekg ordered today demonstrates normal sinus rhythm 70 bpm, within normal limits.  Recent Labs: 11/23/2020: ALT 16; BUN 37; Creatinine, Ser 2.55; Hemoglobin 15.2; Platelets 409; Potassium 3.9; Sodium 143  Recent Lipid Panel    Component Value Date/Time   CHOL 142 08/13/2018 0316   TRIG 51 08/13/2018 0316   HDL 53 08/13/2018 0316   CHOLHDL 2.7 08/13/2018 0316   VLDL 10 08/13/2018 0316   LDLCALC 79 08/13/2018 0316     Risk Assessment/Calculations:           Physical Exam:    VS:  BP 130/86    Pulse  70    Ht 5\' 5"  (1.651 m)    Wt 225 lb 3.2 oz (102.2 kg)    SpO2 95%    BMI 37.48 kg/m     Wt Readings from Last 3 Encounters:  05/14/21 225 lb 3.2 oz (102.2 kg)  05/10/21 218 lb (98.9 kg)  04/19/21 218 lb (98.9 kg)     GEN:  Well nourished, well developed in no acute distress HEENT: Normal NECK: No JVD; No carotid bruits LYMPHATICS: No lymphadenopathy CARDIAC: RRR, no murmurs, rubs, gallops RESPIRATORY:  Clear to auscultation without rales, wheezing or rhonchi  ABDOMEN: Soft, non-tender, non-distended MUSCULOSKELETAL:  No edema; No deformity  SKIN: Warm and dry NEUROLOGIC:  Alert and oriented x 3 PSYCHIATRIC:  Normal affect   ASSESSMENT:    1. Shortness of breath   2. Coronary artery disease involving native coronary artery of native heart without angina pectoris    PLAN:    In order of problems listed above:  2D echo and CT studies are reviewed.  No obvious explanation for the patient's shortness of breath and I suspect this is a multifactorial process.  Weight gain may be a significant component.  The CT CAT scan demonstrates a dilated main pulmonary artery which can be seen with pulmonary hypertension.  Because the  patient's progressive symptoms and marked functional limitation (NYHA functional class III), I have recommended a right heart catheterization to evaluate for pulmonary hypertension or severe LV diastolic dysfunction.  Risks, indications, and alternatives are reviewed with the patient.  She understands the risk of serious complication with right heart catheterization is extremely low.  While she probably does have coronary artery disease based on her segmental LV dysfunction seen on echo, I am not inclined to pursue left heart catheterization/coronary angiography.  The patient has advanced renal disease, she is not experiencing angina, and I think the risk/benefit for coronary angiography and/or PCI is unfavorable.  Hopefully assessment of her cardiac hemodynamics will help direct treatment. Patient with presumed CAD based on segmental wall motion abnormality on her echo study.  Multiple medical problems noted.  Favor conservative therapy at this point.      Shared Decision Making/Informed Consent The risks [stroke (1 in 1000), death (1 in 1000), kidney failure [usually temporary] (1 in 500), bleeding (1 in 200), allergic reaction [possibly serious] (1 in 200)], benefits (diagnostic support and management of coronary artery disease) and alternatives of a cardiac catheterization were discussed in detail with Shelia Cruz and she is willing to proceed.    Medication Adjustments/Labs and Tests Ordered: Current medicines are reviewed at length with the patient today.  Concerns regarding medicines are outlined above.  Orders Placed This Encounter  Procedures   EKG 12-Lead   No orders of the defined types were placed in this encounter.   Patient Instructions  Medication Instructions:  Your physician recommends that you continue on your current medications as directed. Please refer to the Current Medication list given to you today.  *If you need a refill on your cardiac medications before your next  appointment, please call your pharmacy*   Lab Work: None today If you have labs (blood work) drawn today and your tests are completely normal, you will receive your results only by: [place] (if you have MyChart) OR A paper copy in the mail If you have any lab test that is abnormal or we need to change your treatment, we will call you to review the results.   Testing/Procedures: R heart cath Your  physician has requested that you have a cardiac catheterization. Cardiac catheterization is used to diagnose and/or treat various heart conditions. Doctors may recommend this procedure for a number of different reasons. The most common reason is to evaluate chest pain. Chest pain can be a symptom of coronary artery disease (CAD), and cardiac catheterization can show whether plaque is narrowing or blocking your hearts arteries. This procedure is also used to evaluate the valves, as well as measure the blood flow and oxygen levels in different parts of your heart. For further information please visit [email or web address]. Please follow instruction sheet, as given.    Follow-Up: At [facility], you and your health needs are our priority.  As part of our continuing mission to provide you with exceptional heart care, we have created designated Provider Care Teams.  These Care Teams include your primary Cardiologist (physician) and Advanced Practice Providers (APPs -  Physician Assistants and Nurse Practitioners) who all work together to provide you with the care you need, when you need it.}    Other Instructions  [place] OFFICE [place], [place] [address] Dept: [place]: [phone]  Shelia Cruz  05/14/2021  You are scheduled for a Cardiac Catheterization on Thursday, May 20, 2021 with Dr. Daneen Schick.  1. Please arrive at the [facility] (Main Entrance A) at [facility]: [address] [place], [place] [address] at 9:00am (This time is two hours before your procedure to ensure your preparation). Free valet parking service is available.   Special note: Every effort is made to have your procedure done on time. Please understand that emergencies sometimes delay scheduled procedures.  2. Diet: Do not eat solid foods after midnight.  The patient may have clear liquids until 5am upon the day of the procedure.  3. Labs: CBC, BMET will be done prior to procedure  4. Medication instructions in preparation for your procedure:   Contrast Allergy: No  On the morning of your procedure, take Aspirin 81mg   5. Bring a current list of your medications and current insurance cards. 6. You MUST have a responsible person to drive you home. 7. Someone MUST be with you the first 24 hours after you arrive home or your discharge will be delayed. 8. Please wear clothes that are easy to get on and off and wear slip-on shoes.  Thank you for allowing [place] to care for you!   -- [facility] In[facility] Cardiovascular services       Signed, Sherren Mocha, MD  05/14/2021 5:55 PM    [place]

## 2021-05-15 ENCOUNTER — Encounter: Payer: Self-pay | Admitting: Cardiovascular Disease

## 2021-05-17 ENCOUNTER — Encounter: Payer: Self-pay | Admitting: Pulmonary Disease

## 2021-05-17 ENCOUNTER — Encounter: Payer: Self-pay | Admitting: Family Medicine

## 2021-05-17 ENCOUNTER — Other Ambulatory Visit: Payer: Self-pay

## 2021-05-17 ENCOUNTER — Encounter: Payer: Self-pay | Admitting: *Deleted

## 2021-05-17 ENCOUNTER — Other Ambulatory Visit: Payer: Medicare Other | Admitting: Family Medicine

## 2021-05-17 ENCOUNTER — Encounter: Payer: Self-pay | Admitting: Cardiovascular Disease

## 2021-05-17 ENCOUNTER — Telehealth: Payer: Self-pay | Admitting: Cardiovascular Disease

## 2021-05-17 VITALS — BP 144/88 | HR 77 | Temp 97.8°F | Resp 20

## 2021-05-17 DIAGNOSIS — R079 Chest pain, unspecified: Secondary | ICD-10-CM | POA: Diagnosis not present

## 2021-05-17 DIAGNOSIS — Z515 Encounter for palliative care: Secondary | ICD-10-CM

## 2021-05-17 DIAGNOSIS — F321 Major depressive disorder, single episode, moderate: Secondary | ICD-10-CM | POA: Insufficient documentation
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
## 2021-05-17 NOTE — Telephone Encounter (Signed)
Right Heart Cath scheduled at [facility] for: Thursday May 20, 2021 [facility] Main Entrance A [facility]) at: 9 AM   Diet-no solid food after midnight prior to cath, clear liquids until 5 AM day of procedure.  Medication instructions for procedure: -Usual morning medications can be taken pre-cath with sips of water.    Must have responsible adult to drive home post procedure and be with patient first 24 hours after arriving home.  [facility] does allow one visitor to wait in the waiting room during the time you are there.   Patient reports does not currently have any new symptoms concerning for COVID-19 and no household members with COVID-19 like illness.    Reviewed procedure instructions with patient.

## 2021-05-17 NOTE — Telephone Encounter (Signed)
° °  Pt said she missed a call from Dr. Antionette Char nurse about few minutes ago. She wanted to know if that's regarding his procedure tomorrow

## 2021-05-18 DIAGNOSIS — R0609 Other forms of dyspnea: Secondary | ICD-10-CM | POA: Diagnosis not present

## 2021-05-18 DIAGNOSIS — R2681 Unsteadiness on feet: Secondary | ICD-10-CM | POA: Diagnosis not present

## 2021-05-18 DIAGNOSIS — J479 Bronchiectasis, uncomplicated: Secondary | ICD-10-CM | POA: Diagnosis not present

## 2021-05-18 DIAGNOSIS — Z936 Other artificial openings of urinary tract status: Secondary | ICD-10-CM | POA: Diagnosis not present

## 2021-05-18 NOTE — Telephone Encounter (Signed)
Dr. Silas Flood, please see pt's emails. She feels the Trelegy isnt helping. This was prescribed on 04/20/21. Thanks.

## 2021-05-19 DIAGNOSIS — Z436 Encounter for attention to other artificial openings of urinary tract: Secondary | ICD-10-CM | POA: Diagnosis not present

## 2021-05-19 DIAGNOSIS — C672 Malignant neoplasm of lateral wall of bladder: Secondary | ICD-10-CM | POA: Diagnosis not present

## 2021-05-19 DIAGNOSIS — Z4682 Encounter for fitting and adjustment of non-vascular catheter: Secondary | ICD-10-CM | POA: Diagnosis not present

## 2021-05-19 NOTE — H&P (Signed)
Exertional dyspnea, no chest pain, inferobasal wall motion abnormality, with low normal LV EF of 50 to 55%.  Grade 2 diastolic dysfunction noted. ?Creatinine 2.6. ?Presents for right heart cath to assess hemodynamics and hopefully help guide therapy. ?

## 2021-05-19 NOTE — Telephone Encounter (Signed)
CMP/CBC done 05/13/21 at [facility] scanned to [place] "Lab" tab. ?

## 2021-05-20 ENCOUNTER — Ambulatory Visit (HOSPITAL_COMMUNITY)
Admission: RE | Admit: 2021-05-20 | Discharge: 2021-05-20 | Disposition: A | Discharge status: Home / Self Care | Payer: Medicare Other | Attending: Interventional Cardiology | Admitting: Interventional Cardiology

## 2021-05-20 ENCOUNTER — Encounter (HOSPITAL_COMMUNITY)
Admission: RE | Disposition: A | Discharge status: Home / Self Care | Payer: Self-pay | Source: Home / Self Care | Attending: Interventional Cardiology

## 2021-05-20 ENCOUNTER — Other Ambulatory Visit: Payer: Self-pay

## 2021-05-20 DIAGNOSIS — R0609 Other forms of dyspnea: Secondary | ICD-10-CM | POA: Diagnosis not present

## 2021-05-20 DIAGNOSIS — I272 Pulmonary hypertension, unspecified: Secondary | ICD-10-CM | POA: Diagnosis not present

## 2021-05-20 HISTORY — PX: RIGHT HEART CATH: CATH118263

## 2021-05-20 LAB — POCT I-STAT EG7
Acid-base deficit: 2 mmol/L (ref 0.0–2.0)
Acid-base deficit: 2 mmol/L (ref 0.0–2.0)
Bicarbonate: 24.8 mmol/L (ref 20.0–28.0)
Bicarbonate: 25 mmol/L (ref 20.0–28.0)
Calcium, Ion: 1.28 mmol/L (ref 1.15–1.40)
Calcium, Ion: 1.29 mmol/L (ref 1.15–1.40)
HCT: 37 % (ref 36.0–46.0)
HCT: 37 % (ref 36.0–46.0)
Hemoglobin: 12.6 g/dL (ref 12.0–15.0)
Hemoglobin: 12.6 g/dL (ref 12.0–15.0)
O2 Saturation: 77 %
O2 Saturation: 77 %
Potassium: 4.3 mmol/L (ref 3.5–5.1)
Potassium: 4.4 mmol/L (ref 3.5–5.1)
Sodium: 141 mmol/L (ref 135–145)
Sodium: 141 mmol/L (ref 135–145)
TCO2: 26 mmol/L (ref 22–32)
TCO2: 27 mmol/L (ref 22–32)
pCO2, Ven: 48.8 mmHg (ref 44–60)
pCO2, Ven: 49.1 mmHg (ref 44–60)
pH, Ven: 7.314 (ref 7.25–7.43)
pH, Ven: 7.316 (ref 7.25–7.43)
pO2, Ven: 45 mmHg (ref 32–45)
pO2, Ven: 46 mmHg — ABNORMAL HIGH (ref 32–45)

## 2021-05-20 SURGERY — RIGHT HEART CATH

## 2021-05-20 MED ORDER — HYDRALAZINE HCL 20 MG/ML IJ SOLN: 10.0000 mg | INTRAMUSCULAR | Status: DC | PRN

## 2021-05-20 MED ORDER — SODIUM CHLORIDE 0.9 % IV SOLN: 250.0000 mL | INTRAVENOUS | Status: DC | PRN

## 2021-05-20 MED ORDER — SODIUM CHLORIDE 0.9% FLUSH: 3.0000 mL | Freq: Two times a day (BID) | INTRAVENOUS | Status: DC

## 2021-05-20 MED ORDER — SODIUM CHLORIDE 0.9 % IV SOLN
INTRAVENOUS | Status: DC
Start: 2021-05-20 — End: 2021-05-20

## 2021-05-20 MED ORDER — SODIUM CHLORIDE 0.9 % WEIGHT BASED INFUSION: 1.0000 mL/kg/h | INTRAVENOUS | Status: DC

## 2021-05-20 MED ORDER — LABETALOL HCL 5 MG/ML IV SOLN: 10.0000 mg | INTRAVENOUS | Status: DC | PRN

## 2021-05-20 MED ORDER — ONDANSETRON HCL 4 MG/2ML IJ SOLN: 4.0000 mg | Freq: Four times a day (QID) | INTRAMUSCULAR | Status: DC | PRN

## 2021-05-20 MED ORDER — MIDAZOLAM HCL 2 MG/2ML IJ SOLN
INTRAMUSCULAR | Status: DC | PRN
  Administered 2021-05-20: .5 mg via INTRAVENOUS

## 2021-05-20 MED ORDER — HEPARIN (PORCINE) IN NACL 1000-0.9 UT/500ML-% IV SOLN
INTRAVENOUS | Status: AC
  Filled 2021-05-20: qty 500

## 2021-05-20 MED ORDER — ACETAMINOPHEN 325 MG PO TABS: 650.0000 mg | ORAL_TABLET | ORAL | Status: DC | PRN

## 2021-05-20 MED ORDER — SODIUM CHLORIDE 0.9% FLUSH: 3.0000 mL | INTRAVENOUS | Status: DC | PRN

## 2021-05-20 MED ORDER — FENTANYL CITRATE (PF) 100 MCG/2ML IJ SOLN
INTRAMUSCULAR | Status: AC
  Filled 2021-05-20: qty 2

## 2021-05-20 MED ORDER — SODIUM CHLORIDE 0.9% FLUSH
3.0000 mL | INTRAVENOUS | Status: DC | PRN
Start: 2021-05-20 — End: 2021-05-20

## 2021-05-20 MED ORDER — MIDAZOLAM HCL 2 MG/2ML IJ SOLN
INTRAMUSCULAR | Status: AC
  Filled 2021-05-20: qty 2

## 2021-05-20 MED ORDER — LIDOCAINE HCL (PF) 1 % IJ SOLN
INTRAMUSCULAR | Status: DC | PRN
  Administered 2021-05-20: 2 mL

## 2021-05-20 MED ORDER — SODIUM CHLORIDE 0.9 % WEIGHT BASED INFUSION: 3.0000 mL/kg/h | INTRAVENOUS | Status: DC

## 2021-05-20 MED ORDER — FENTANYL CITRATE (PF) 100 MCG/2ML IJ SOLN
INTRAMUSCULAR | Status: DC | PRN
Start: 2021-05-20 — End: 2021-05-20
  Administered 2021-05-20: 25 ug via INTRAVENOUS

## 2021-05-20 MED ORDER — LIDOCAINE HCL (PF) 1 % IJ SOLN
INTRAMUSCULAR | Status: AC
  Filled 2021-05-20: qty 30

## 2021-05-20 MED ORDER — HEPARIN (PORCINE) IN NACL 1000-0.9 UT/500ML-% IV SOLN
INTRAVENOUS | Status: DC | PRN
  Administered 2021-05-20: 500 mL

## 2021-05-20 SURGICAL SUPPLY — 7 items
CATH BALLN WEDGE 5F 110CM (CATHETERS) ×1 IMPLANT
KIT HEART LEFT (KITS) ×1 IMPLANT
PACK CARDIAC CATHETERIZATION (CUSTOM PROCEDURE TRAY) ×1 IMPLANT
SHEATH GLIDE SLENDER 4/5FR (SHEATH) ×1 IMPLANT
SHEATH PROBE COVER 6X72 (BAG) ×1 IMPLANT
TUBING ART PRESS 72  MALE/MALE (TUBING) ×1 IMPLANT
WIRE MICROINTRODUCER 60CM (WIRE) ×1 IMPLANT

## 2021-05-20 NOTE — Discharge Instructions (Signed)
Call Dr. Tamala Julian for any problems or concerns. Watch IV site for any redness, swelling, drainage- call Dr. Tamala Julian  ?

## 2021-05-20 NOTE — Progress Notes (Signed)
Shelia Cruz - could you let her know that I reviewed her cath data and the study showed that the pressures in her heart are a little high and indicate too much fluid on board. I think she should try furosemide 20 mg daily to see if a low-dose diuretic might improve her breathing. thx ?

## 2021-05-20 NOTE — Interval H&P Note (Signed)
Cath Lab Visit (complete for each Cath Lab visit) ? ?Clinical Evaluation Leading to the Procedure:  ? ?ACS: No. ? ?Non-ACS:   ? ?Anginal Classification: CCS III ? ?Anti-ischemic medical therapy: No Therapy ? ?Non-Invasive Test Results: No non-invasive testing performed ? ?Prior CABG: No previous CABG ? ? ? ? ? ?History and Physical Interval Note: ? ?05/20/2021 ?9:47 AM ? ?Shelia Cruz  has presented today for surgery, with the diagnosis of shortness of breath.  The various methods of treatment have been discussed with the patient and family. After consideration of risks, benefits and other options for treatment, the patient has consented to  Procedure(s): ?RIGHT HEART CATH (N/A) as a surgical intervention.  The patient's history has been reviewed, patient examined, no change in status, stable for surgery.  I have reviewed the patient's chart and labs.  Questions were answered to the patient's satisfaction.   ? ? ?[name] ? ? ?

## 2021-05-20 NOTE — CV Procedure (Signed)
Right heart catheterization data: ?Right atrial mean 10 mmHg ?RV pressure 38 over 16 mmHg ?PA mean 25 mmHg, consistent with mild pulmonary hypertension, probably WHO group 2 or Group 5 ?Pulmonary capillary wedge mean 20 mmHg ?Pulmonary vascular resistance 2.88 Wood units ?Pulmonary oxygen saturation 77% ?Peripheral arterial oxygen saturation 95% ?Cardiac output 8.68 L/min; index 4.21 ?

## 2021-05-21 ENCOUNTER — Telehealth: Payer: Self-pay

## 2021-05-21 ENCOUNTER — Encounter (HOSPITAL_COMMUNITY): Payer: Self-pay | Admitting: Interventional Cardiology

## 2021-05-21 DIAGNOSIS — Z79899 Other long term (current) drug therapy: Secondary | ICD-10-CM

## 2021-05-21 DIAGNOSIS — R0602 Shortness of breath: Secondary | ICD-10-CM

## 2021-05-21 MED ORDER — FUROSEMIDE 20 MG PO TABS: 20.0000 mg | ORAL_TABLET | Freq: Every day | ORAL | 3 refills | Status: DC

## 2021-05-21 NOTE — Telephone Encounter (Signed)
-----   Message from Sherren Mocha, MD sent at 05/20/2021  7:55 PM EST ----- ? ? ? ?----- Message ----- ?From: Belva Crome, MD ?Sent: 05/20/2021  11:42 AM EST ?To: Radene Gunning, NP, Sherren Mocha, MD ? ?Ronalee Belts, ?Mild pulmonary hypertension with elevated mean wedge. Also mild increase in RA mean. ?

## 2021-05-21 NOTE — Telephone Encounter (Signed)
Author: Sherren Mocha, MD Service: Cardiology Author Type: Physician  ?Filed: 05/20/2021  7:56 PM Date of Service: 05/20/2021 12:00 PM Status: Signed  ?Editor: Sherren Mocha, MD (Physician)  ?   ?   ?Judson Roch - could you let her know that I reviewed her cath data and the study showed that the pressures in her heart are a little high and indicate too much fluid on board. I think she should try furosemide 20 mg daily to see if a low-dose diuretic might improve her breathing. thx  ?  ? ?Made patient aware of recommendations and answered questions related to kidney functioning. Pt states that she follows with her nephrologist every 2 months and gets blood work done. Cooper aware and requesting that pt get a BMET done 2-3 weeks after beginning lasix. Pt has transportation issues and may wish to hold off on beginning medication until she can coordinate it with her next nephrology appt. ? ?Will contact pt via MyChart to get name of current nephrologist and send copy of notes to office to make them aware. ?

## 2021-05-21 NOTE — Telephone Encounter (Signed)
Added Dr Candiss Norse to pt's treatment team on file and sent note and recommendation to him from Dr Burt Knack stating that she should have a BMET drawn 2-3 weeks after beginning Lasix. Pt states that she has an appt on 3/14 there and will be getting blood work (per our telephone conversation earlier). Advised pt that if she wants it done here instead, to let me know and we will schedule it. ?

## 2021-05-26 NOTE — Interval H&P Note (Signed)
History and Physical Interval Note: ? ?05/26/2021 ?2:49 PM ? ?Shelia Cruz  has presented today for surgery, with the diagnosis of shortness of breath.  The various methods of treatment have been discussed with the patient and family. After consideration of risks, benefits and other options for treatment, the patient has consented to  Procedure(s): ?RIGHT HEART CATH (N/A) as a surgical intervention.  The patient's history has been reviewed, patient examined, no change in status, stable for surgery.  I have reviewed the patient's chart and labs.  Questions were answered to the patient's satisfaction.   ? ? ?[name] ? ? ?

## 2021-05-27 DIAGNOSIS — C672 Malignant neoplasm of lateral wall of bladder: Secondary | ICD-10-CM | POA: Diagnosis not present

## 2021-05-27 DIAGNOSIS — Z08 Encounter for follow-up examination after completed treatment for malignant neoplasm: Secondary | ICD-10-CM | POA: Diagnosis not present

## 2021-05-27 DIAGNOSIS — C652 Malignant neoplasm of left renal pelvis: Secondary | ICD-10-CM | POA: Diagnosis not present

## 2021-05-27 DIAGNOSIS — Z8553 Personal history of malignant neoplasm of renal pelvis: Secondary | ICD-10-CM | POA: Diagnosis not present

## 2021-05-27 DIAGNOSIS — Z8551 Personal history of malignant neoplasm of bladder: Secondary | ICD-10-CM | POA: Diagnosis not present

## 2021-05-27 DIAGNOSIS — Z9889 Other specified postprocedural states: Secondary | ICD-10-CM | POA: Diagnosis not present

## 2021-05-31 ENCOUNTER — Ambulatory Visit: Payer: Medicare Other | Admitting: Internal Medicine

## 2021-06-01 DIAGNOSIS — D631 Anemia in chronic kidney disease: Secondary | ICD-10-CM | POA: Diagnosis not present

## 2021-06-01 DIAGNOSIS — C7911 Secondary malignant neoplasm of bladder: Secondary | ICD-10-CM | POA: Diagnosis not present

## 2021-06-01 DIAGNOSIS — N184 Chronic kidney disease, stage 4 (severe): Secondary | ICD-10-CM | POA: Diagnosis not present

## 2021-06-01 DIAGNOSIS — I129 Hypertensive chronic kidney disease with stage 1 through stage 4 chronic kidney disease, or unspecified chronic kidney disease: Secondary | ICD-10-CM | POA: Diagnosis not present

## 2021-06-01 DIAGNOSIS — N2581 Secondary hyperparathyroidism of renal origin: Secondary | ICD-10-CM | POA: Diagnosis not present

## 2021-06-02 ENCOUNTER — Other Ambulatory Visit: Payer: Self-pay

## 2021-06-02 ENCOUNTER — Other Ambulatory Visit: Payer: Medicare Other | Admitting: Family Medicine

## 2021-06-02 ENCOUNTER — Encounter: Payer: Self-pay | Admitting: Family Medicine

## 2021-06-02 VITALS — BP 138/84 | HR 70 | Temp 97.6°F | Resp 18

## 2021-06-02 DIAGNOSIS — F321 Major depressive disorder, single episode, moderate: Secondary | ICD-10-CM

## 2021-06-02 DIAGNOSIS — Z936 Other artificial openings of urinary tract status: Secondary | ICD-10-CM | POA: Diagnosis not present

## 2021-06-02 DIAGNOSIS — R079 Chest pain, unspecified: Secondary | ICD-10-CM

## 2021-06-02 DIAGNOSIS — R0609 Other forms of dyspnea: Secondary | ICD-10-CM

## 2021-06-02 DIAGNOSIS — R102 Pelvic and perineal pain: Secondary | ICD-10-CM

## 2021-06-02 DIAGNOSIS — J479 Bronchiectasis, uncomplicated: Secondary | ICD-10-CM | POA: Diagnosis not present

## 2021-06-02 DIAGNOSIS — Z8551 Personal history of malignant neoplasm of bladder: Secondary | ICD-10-CM

## 2021-06-02 DIAGNOSIS — R197 Diarrhea, unspecified: Secondary | ICD-10-CM

## 2021-06-02 DIAGNOSIS — I272 Pulmonary hypertension, unspecified: Secondary | ICD-10-CM

## 2021-06-02 DIAGNOSIS — R2681 Unsteadiness on feet: Secondary | ICD-10-CM | POA: Diagnosis not present

## 2021-06-02 HISTORY — DX: Pelvic and perineal pain: R10.2
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
## 2021-06-03 ENCOUNTER — Telehealth: Payer: Self-pay

## 2021-06-03 NOTE — Telephone Encounter (Signed)
(  3:20 pm) SW left a message for patient extending support and requested a call back SW also emailed several resources for counseling in the area.  ?

## 2021-06-07 ENCOUNTER — Encounter: Payer: Self-pay | Admitting: Cardiovascular Disease

## 2021-06-07 ENCOUNTER — Other Ambulatory Visit: Payer: Self-pay

## 2021-06-07 DIAGNOSIS — Z905 Acquired absence of kidney: Secondary | ICD-10-CM

## 2021-06-07 DIAGNOSIS — Z79899 Other long term (current) drug therapy: Secondary | ICD-10-CM

## 2021-06-07 DIAGNOSIS — N179 Acute kidney failure, unspecified: Secondary | ICD-10-CM

## 2021-06-07 DIAGNOSIS — R6889 Other general symptoms and signs: Secondary | ICD-10-CM | POA: Diagnosis not present

## 2021-06-07 NOTE — Telephone Encounter (Signed)
Order placed for repeat BMET to evaluate kidney function post-furosemide start per pt request to be done here and orders of Dr Burt Knack. See 05/21/21 telephone note for details. ?

## 2021-06-08 DIAGNOSIS — R6889 Other general symptoms and signs: Secondary | ICD-10-CM | POA: Diagnosis not present

## 2021-06-10 ENCOUNTER — Encounter: Payer: Self-pay | Admitting: Pulmonary Disease

## 2021-06-11 ENCOUNTER — Encounter: Payer: Self-pay | Admitting: Cardiovascular Disease

## 2021-06-11 DIAGNOSIS — Z79899 Other long term (current) drug therapy: Secondary | ICD-10-CM | POA: Diagnosis not present

## 2021-06-11 DIAGNOSIS — R0602 Shortness of breath: Secondary | ICD-10-CM | POA: Diagnosis not present

## 2021-06-11 LAB — BASIC METABOLIC PANEL
BUN/Creatinine Ratio: 19 (ref 12–28)
BUN: 53 mg/dL — ABNORMAL HIGH (ref 8–27)
CO2: 19 mmol/L — ABNORMAL LOW (ref 20–29)
Calcium: 9.8 mg/dL (ref 8.7–10.3)
Chloride: 104 mmol/L (ref 96–106)
Creatinine, Ser: 2.81 mg/dL — ABNORMAL HIGH (ref 0.57–1.00)
Glucose: 94 mg/dL (ref 70–99)
Potassium: 5.1 mmol/L (ref 3.5–5.2)
Sodium: 141 mmol/L (ref 134–144)
eGFR: 18 mL/min/{1.73_m2} — ABNORMAL LOW (ref >59)

## 2021-06-16 DIAGNOSIS — R0609 Other forms of dyspnea: Secondary | ICD-10-CM | POA: Diagnosis not present

## 2021-06-16 DIAGNOSIS — R2681 Unsteadiness on feet: Secondary | ICD-10-CM | POA: Diagnosis not present

## 2021-06-16 DIAGNOSIS — Z936 Other artificial openings of urinary tract status: Secondary | ICD-10-CM | POA: Diagnosis not present

## 2021-06-16 DIAGNOSIS — J479 Bronchiectasis, uncomplicated: Secondary | ICD-10-CM | POA: Diagnosis not present

## 2021-06-23 DIAGNOSIS — Z936 Other artificial openings of urinary tract status: Secondary | ICD-10-CM | POA: Diagnosis not present

## 2021-07-02 ENCOUNTER — Other Ambulatory Visit: Payer: Medicare Other | Admitting: Family Medicine

## 2021-07-07 ENCOUNTER — Telehealth: Payer: Self-pay | Admitting: Family Medicine

## 2021-07-07 NOTE — Telephone Encounter (Signed)
Left message for pt that due to Covid exposure this provider was attempting to reschedule for either a video visit tomorrow or for in-person appointment next Thursday.  Left phone number for call back.  Damaris Hippo FNP-C ?

## 2021-07-09 ENCOUNTER — Other Ambulatory Visit: Payer: Medicare Other | Admitting: Family Medicine

## 2021-07-12 ENCOUNTER — Other Ambulatory Visit: Payer: Medicare Other | Admitting: Family Medicine

## 2021-07-15 ENCOUNTER — Ambulatory Visit: Payer: Medicare Other | Admitting: Pulmonary Disease

## 2021-08-04 DIAGNOSIS — I129 Hypertensive chronic kidney disease with stage 1 through stage 4 chronic kidney disease, or unspecified chronic kidney disease: Secondary | ICD-10-CM | POA: Diagnosis not present

## 2021-08-04 DIAGNOSIS — E559 Vitamin D deficiency, unspecified: Secondary | ICD-10-CM | POA: Diagnosis not present

## 2021-08-04 DIAGNOSIS — N184 Chronic kidney disease, stage 4 (severe): Secondary | ICD-10-CM | POA: Diagnosis not present

## 2021-08-04 DIAGNOSIS — D631 Anemia in chronic kidney disease: Secondary | ICD-10-CM | POA: Diagnosis not present

## 2021-08-04 DIAGNOSIS — N2581 Secondary hyperparathyroidism of renal origin: Secondary | ICD-10-CM | POA: Diagnosis not present

## 2021-08-25 DIAGNOSIS — Z4682 Encounter for fitting and adjustment of non-vascular catheter: Secondary | ICD-10-CM | POA: Diagnosis not present

## 2021-08-25 DIAGNOSIS — C672 Malignant neoplasm of lateral wall of bladder: Secondary | ICD-10-CM | POA: Diagnosis not present

## 2021-08-30 DIAGNOSIS — H401131 Primary open-angle glaucoma, bilateral, mild stage: Secondary | ICD-10-CM | POA: Diagnosis not present

## 2021-09-06 NOTE — Progress Notes (Signed)
$'@Patient'N$  ID: Shelia Cruz, female    DOB: [birth year], 71 y.o.   MRN: [id number]  Chief Complaint  Patient presents with   Follow-up    Follow up for [place]. Pt states that her DOE is not doing well. Pt states that the Breo inhaler is working ok for her     Referring provider: Kathyrn Lass, MD  HPI:   71 y.o. whom we are seeing in follow up for evaluation of dyspnea on exertion.   Dyspnea seems no better. Using breo. Admits not very active.  PFTs performed today reviewed with patient in detail.  Spirometry without fixed obstruction but suggestive of mild restriction versus air trapping, no bronchodilator response, TLC moderately reduced with severely low ERV, but used nitrogen washout so could be underestimated, DLCO within normal limits.  HPI at initial visit: She reports several month history of dyspnea.  Over 6 months.  Less than a year.  No clear inciting event.  No time of when things are better or worse.  No position to make things better or worse.  No seasonal environmental factors she can identify to make things better or worse.  Worse with inclines or stairs.  Present at rest at times.  Some chest tightness or heaviness that comes and goes.  Not reliably reproduced with exertion.  No other relieving or exacerbating factors.    Reviewed nuclear medicine cardiac stress test 07/2018 that is normal without evidence of ischemia.Reviewed TTE that demonstrated hypokinesis of the left ventricular basal mid inferior septal wall and inferior wall concerning for ischemia of the right coronary artery, borderline EF 50 to 55%, elevated left atrial pressure normal RV size and function.  Reviewed chest imaging CT 07/2018 that shows mild bibasilar bronchiectasis and mild bronchial thickening throughout on my interpretation.  Reviewed CT chest 11/23/2020 that shows mild bibasilar bronchiectasis without significant change in similar bronchial appearance when compared to 2020 on my  interpretation.  PMH: Hypothyroidism, hypertension, GERD, history of bladder cancer Surgical history: Hysterectomy, cholecystectomy, knee replacement bilaterally, nephrectomy Family history: Mother with hypertension Social history: Never smoker, lives in [place] / Pulmonary Flowsheets:   ACT:      No data to display          MMRC:     No data to display          Epworth:      No data to display          Tests:   FENO:  No results found for: "NITRICOXIDE"  PFT:    Latest Ref Rng & Units 04/19/2021    2:49 PM  PFT Results  FVC-Pre L 2.29   FVC-Predicted Pre % 72   FVC-Post L 2.00   FVC-Predicted Post % 63   Pre FEV1/FVC % % 73   Post FEV1/FCV % % 62   FEV1-Pre L 1.69   FEV1-Predicted Pre % 70   FEV1-Post L 1.24   DLCO uncorrected ml/min/mmHg 17.49   DLCO UNC% % 86   DLCO corrected ml/min/mmHg 17.49   DLCO COR %Predicted % 86   DLVA Predicted % 129   Personally reviewed and interpreted [id number] without fixed obstruction but suggestive of mild restriction versus air trapping, no bronchodilator response, TLC moderately reduced with severely low ERV, but used nitrogen washout so could be underestimated, DLCO within normal limits. WALK:      No data to display          Imaging: Personally reviewed and as per  EMR and discussion in this note No results found.  Lab Results: Personally reviewed, notably mild elevation of eosinophils present CBC    Component Value Date/Time   WBC 11.4 (H) 11/23/2020 1924   RBC 5.38 (H) 11/23/2020 1924   HGB 12.6 05/20/2021 1036   HCT 37.0 05/20/2021 1036   PLT 409 (H) 11/23/2020 1924   MCV 89.4 11/23/2020 1924   MCH 28.3 11/23/2020 1924   MCHC 31.6 11/23/2020 1924   RDW 13.9 11/23/2020 1924   LYMPHSABS 1.7 11/23/2020 1924   MONOABS 0.6 11/23/2020 1924   EOSABS 0.4 11/23/2020 1924   BASOSABS 0.1 11/23/2020 1924    BMET    Component Value Date/Time   NA 141 06/11/2021 1113   K 5.1  06/11/2021 1113   CL 104 06/11/2021 1113   CO2 19 (L) 06/11/2021 1113   GLUCOSE 94 06/11/2021 1113   GLUCOSE 105 (H) 11/23/2020 1924   BUN 53 (H) 06/11/2021 1113   CREATININE 2.81 (H) 06/11/2021 1113   CALCIUM 9.8 06/11/2021 1113   GFRNONAA 20 (L) 11/23/2020 1924   GFRAA 5 (L) 10/04/2019 1018    BNP    Component Value Date/Time   BNP 71.1 08/12/2018 2306    ProBNP No results found for: "PROBNP"  Specialty Problems       Pulmonary Problems   Hypoxia   Hemoptysis   Exertional dyspnea    Allergies  Allergen Reactions   Iodinated Contrast Media Other (See Comments)    Was told to avoid after being diagnosed with stage 3 kidney disease   Sulfamethoxazole-Trimethoprim Swelling    Facial swelling and rash   Ciprofloxacin Nausea And Vomiting   Sulfacetamide Sodium Swelling    Immunization History  Administered Date(s) Administered   Influenza Split 11/19/2019   Influenza, High Dose Seasonal PF 11/14/2019, 12/22/2020   Influenza,inj,quad, With Preservative 01/05/2018   PFIZER Comirnaty(Gray Top)Covid-19 Tri-Sucrose Vaccine 06/12/2019, 07/10/2019, 11/26/2019   PFIZER(Purple Top)SARS-COV-2 Vaccination 06/12/2019, 07/10/2019, 11/26/2019   Pneumococcal Conjugate-13 09/19/2016   Pneumococcal Polysaccharide-23 12/19/2014, 01/09/2018   Tdap 03/21/2013   Zoster Recombinat (Shingrix) 11/26/2019   Zoster, Live 11/26/2019    Past Medical History:  Diagnosis Date   Bladder cancer ([place])    bladder cancer   GERD (gastroesophageal reflux disease)    Hypertension    Pancreatic mass    Renal disorder    Thyroid disease    Vulvar pain 06/02/2021    Tobacco History: Social History   Tobacco Use  Smoking Status Never  Smokeless Tobacco Never   Counseling given: Not Answered   Continue to not smoke  Outpatient Encounter Medications as of 04/19/2021  Medication Sig   amLODipine (NORVASC) 2.5 MG tablet Take 1 tablet (2.5 mg total) by mouth daily.   bimatoprost (LUMIGAN)  0.01 % SOLN Place 1 drop into both eyes at bedtime.   fluticasone (FLONASE) 50 MCG/ACT nasal spray Place 1 spray into the nose daily.   gabapentin (NEURONTIN) 300 MG capsule Take 300 mg by mouth 2 (two) times daily as needed (pain).   levothyroxine (SYNTHROID) 150 MCG tablet Take 150 mcg by mouth daily before breakfast.   pantoprazole (PROTONIX) 40 MG tablet Take 1 tablet (40 mg total) by mouth 2 (two) times daily. (Patient taking differently: Take 80 mg by mouth daily.)   Probiotic Product (ALIGN) 4 MG CAPS Take 1 capsule by mouth daily.   rOPINIRole (REQUIP) 2 MG tablet Take 1 tablet (2 mg total) by mouth at bedtime as needed (restless legs). (Patient taking differently:  Take 2 mg by mouth at bedtime.)   sertraline (ZOLOFT) 50 MG tablet Take 50 mg by mouth daily.   traMADol (ULTRAM) 50 MG tablet Take 50 mg by mouth 2 (two) times daily as needed for severe pain.   [DISCONTINUED] fluticasone furoate-vilanterol (BREO ELLIPTA) 200-25 MCG/ACT AEPB Inhale 1 puff into the lungs daily.   [DISCONTINUED] Fluticasone-Umeclidin-Vilant (TRELEGY ELLIPTA) 200-62.5-25 MCG/ACT AEPB Inhale 1 puff into the lungs daily. (Patient not taking: Reported on 05/14/2021)   [DISCONTINUED] Cholecalciferol (VITAMIN D3) 50 MCG (2000 UT) capsule Take 2,000 Units by mouth 4 (four) times a week. Tuesday, Thursday, Saturday and Sunday   [DISCONTINUED] DULoxetine (CYMBALTA) 20 MG capsule Take 1 capsule (20 mg total) by mouth daily for 2 days.   [DISCONTINUED] DULoxetine 40 MG CPEP Take 40 mg by mouth daily for 1 day.   [DISCONTINUED] ferrous sulfate 325 (65 FE) MG tablet Take 325 mg by mouth daily with breakfast.   [DISCONTINUED] fluticasone furoate-vilanterol (BREO ELLIPTA) 100-25 MCG/ACT AEPB Inhale 1 puff into the lungs daily.   [DISCONTINUED] HYDROcodone-acetaminophen (NORCO/VICODIN) 5-325 MG tablet Take 1 tablet by mouth every 6 (six) hours as needed for severe pain.   [DISCONTINUED] hydrOXYzine (ATARAX/VISTARIL) 25 MG tablet  Take 1 tablet (25 mg total) by mouth 3 (three) times daily as needed for anxiety. (Patient taking differently: Take 25 mg by mouth 3 (three) times daily.)   [DISCONTINUED] Multiple Vitamin (MULTIVITAMIN) capsule Take 1 capsule by mouth daily.   [DISCONTINUED] sertraline (ZOLOFT) 25 MG tablet Take 1 tablet (25 mg total) by mouth daily.   No facility-administered encounter medications on file as of 04/19/2021.     Review of Systems  Review of Systems  N/a Physical Exam  BP 130/72 (BP Location: Left Arm, Patient Position: Sitting, Cuff Size: Normal)   Pulse 82   Temp 97.9 F (36.6 C) (Oral)   Ht '5\' 6"'$  (1.676 m)   Wt 218 lb (98.9 kg)   SpO2 96%   BMI 35.19 kg/m   Wt Readings from Last 5 Encounters:  05/20/21 220 lb (99.8 kg)  05/14/21 225 lb 3.2 oz (102.2 kg)  05/10/21 218 lb (98.9 kg)  04/19/21 218 lb (98.9 kg)  01/14/21 218 lb 6.4 oz (99.1 kg)    BMI Readings from Last 5 Encounters:  05/20/21 36.61 kg/m  05/14/21 37.48 kg/m  05/10/21 35.19 kg/m  04/19/21 35.19 kg/m  01/14/21 36.34 kg/m     Physical Exam General: Well-appearing, sitting in exam chair, Eyes: EOMI, no icterus Neck: Supple, no JVP Cardiovascular: Regular rate and rhythm, no murmur Pulmonary: Clear, good excursion, normal work of breathing Abdomen: Nondistended, bowel sounds present MSK: No synovitis, no joint effusion Neuro: Normal gait, no weakness Psych: Normal mood, full affect   Assessment & Plan:   Dyspnea on exertion: Suspect multifactorial.  Weight has increased over the last year or so.  Less active, deconditioning likely playing a role.  Possible cardiac opponent given prior abnormal TTE 2020 with elevated left atrial pressure, some focal wall motion deficits although subsequent nuclear medicine scan was reportedly normal.  CT scan demonstrates mild bibasilar bronchiectasis, mildly thickened bronchials throughout.  Possibly chronic bronchitis versus asthma.  Not improved with Breo.   Escalate to Trelegy.  PFTs consistent with extrathoracic restriction, likely due to habitus.  CT scan ordered to rule out ILD.  Bronchiectasis: Mild, bibasilar.  Likely reflective of chronic GERD versus aspiration.  Denies frank aspiration symptoms.  No dysphagia.  Continue to monitor.   Return in about 3 months (  around 07/18/2021).   Lanier Clam, MD 09/06/2021

## 2021-09-22 ENCOUNTER — Telehealth: Payer: Self-pay | Admitting: Family Medicine

## 2021-09-22 NOTE — Telephone Encounter (Signed)
Left vm for patient that I was calling to follow up to schedule a visit.  Left my number for return call.  Damaris Hippo FNP-C

## 2021-09-25 DIAGNOSIS — Z936 Other artificial openings of urinary tract status: Secondary | ICD-10-CM | POA: Diagnosis not present

## 2021-10-04 ENCOUNTER — Ambulatory Visit: Payer: Medicare Other | Admitting: Cardiovascular Disease

## 2021-12-14 DIAGNOSIS — E559 Vitamin D deficiency, unspecified: Secondary | ICD-10-CM | POA: Diagnosis not present

## 2021-12-14 DIAGNOSIS — N2581 Secondary hyperparathyroidism of renal origin: Secondary | ICD-10-CM | POA: Diagnosis not present

## 2021-12-14 DIAGNOSIS — N184 Chronic kidney disease, stage 4 (severe): Secondary | ICD-10-CM | POA: Diagnosis not present

## 2021-12-14 DIAGNOSIS — I129 Hypertensive chronic kidney disease with stage 1 through stage 4 chronic kidney disease, or unspecified chronic kidney disease: Secondary | ICD-10-CM | POA: Diagnosis not present

## 2021-12-14 DIAGNOSIS — C7911 Secondary malignant neoplasm of bladder: Secondary | ICD-10-CM | POA: Diagnosis not present

## 2021-12-14 DIAGNOSIS — D631 Anemia in chronic kidney disease: Secondary | ICD-10-CM | POA: Diagnosis not present

## 2021-12-15 ENCOUNTER — Telehealth: Payer: Self-pay | Admitting: *Deleted

## 2021-12-16 ENCOUNTER — Ambulatory Visit: Payer: Medicare Other | Admitting: Pulmonary Disease

## 2021-12-16 ENCOUNTER — Ambulatory Visit (INDEPENDENT_AMBULATORY_CARE_PROVIDER_SITE_OTHER): Payer: Medicare Other

## 2021-12-16 ENCOUNTER — Other Ambulatory Visit: Payer: Self-pay | Admitting: Pulmonary Disease

## 2021-12-16 ENCOUNTER — Ambulatory Visit: Payer: Medicare Other

## 2021-12-16 ENCOUNTER — Encounter: Payer: Self-pay | Admitting: Pulmonary Disease

## 2021-12-16 VITALS — BP 132/88 | HR 84 | Ht 64.0 in | Wt 214.2 lb

## 2021-12-16 DIAGNOSIS — J40 Bronchitis, not specified as acute or chronic: Secondary | ICD-10-CM

## 2021-12-16 DIAGNOSIS — R059 Cough, unspecified: Secondary | ICD-10-CM | POA: Diagnosis not present

## 2021-12-16 DIAGNOSIS — R051 Acute cough: Secondary | ICD-10-CM

## 2021-12-16 DIAGNOSIS — J471 Bronchiectasis with (acute) exacerbation: Secondary | ICD-10-CM

## 2021-12-16 MED ORDER — ALBUTEROL SULFATE HFA 108 (90 BASE) MCG/ACT IN AERS: 2.0000 | INHALATION_SPRAY | Freq: Four times a day (QID) | RESPIRATORY_TRACT | 6 refills | Status: DC | PRN

## 2021-12-16 MED ORDER — DOXYCYCLINE HYCLATE 100 MG PO TABS: 100.0000 mg | ORAL_TABLET | Freq: Two times a day (BID) | ORAL | 0 refills | Status: AC

## 2021-12-16 MED ORDER — PREDNISONE 20 MG PO TABS: 40.0000 mg | ORAL_TABLET | Freq: Every day | ORAL | 0 refills | Status: AC

## 2021-12-16 NOTE — Patient Instructions (Addendum)
I am sorry you are not feeling well  Your chest x-ray looks largely really unchanged to me from most recent chest x-ray in October 2022.  I do not see any evidence of pneumonia.  You are wheezing today.  To treat this I sent in prednisone 40 mg once a day for 5 days.  In addition, I called in antibiotic doxycycline.  Take 1 pill twice a day for 14 days.  This is to help treat mucus production that can build up.  I sent in a prescription for a rescue inhaler, albuterol.  Use 2 puffs every 6 hours as needed for wheeze or shortness of breath.  With your symptoms may be hard to use but if your symptoms start to get a little bit better and you can take deep breaths without coughing the albuterol may be easier use it may be more helpful.  My hope is the combination of these medications will help your cough and your shortness of breath in the coming days.  If you worsen over the next 24 to 72 hours I recommend going to the emergency room for further evaluation.  If you are not improving by early next week please call our office for additional recommendations.  Return to clinic in 3 months or sooner if needed with Dr. Silas Flood

## 2021-12-16 NOTE — Telephone Encounter (Signed)
Appt set for this afternoon.

## 2021-12-16 NOTE — Progress Notes (Signed)
$'@Patient'j$  ID: Shelia Cruz, female    DOB: 1951/03/19, 71 y.o.   MRN: [id number]  Chief Complaint  Patient presents with   Follow-up    Referring provider: Kathyrn Lass, MD  HPI:   71 y.o. whom we are seeing as acute visit for 10 days of productive cough wheeze and dyspnea on exertion.   Patient lost to follow-up.  Baseline dyspnea largely unchanged it sounds like.  Acute onset of cough and wheeze 12/06/2021.  Progressive shortness of breath.  Worsened to the point where she contacted the office last week.  Schedule with appointment with me.  To the COVID test that was negative.  Chest x-ray performed today without radiology read yet but on my review interpretation reveals clear lungs bilaterally largely unchanged from 12/2020, no evidence of effusion, focal infiltrate etc.  HPI at initial visit: She reports several month history of dyspnea.  Over 6 months.  Less than a year.  No clear inciting event.  No time of when things are better or worse.  No position to make things better or worse.  No seasonal environmental factors she can identify to make things better or worse.  Worse with inclines or stairs.  Present at rest at times.  Some chest tightness or heaviness that comes and goes.  Not reliably reproduced with exertion.  No other relieving or exacerbating factors.    Reviewed nuclear medicine cardiac stress test 07/2018 that is normal without evidence of ischemia.Reviewed TTE that demonstrated hypokinesis of the left ventricular basal mid inferior septal wall and inferior wall concerning for ischemia of the right coronary artery, borderline EF 50 to 55%, elevated left atrial pressure normal RV size and function.  Reviewed chest imaging CT 07/2018 that shows mild bibasilar bronchiectasis and mild bronchial thickening throughout on my interpretation.  Reviewed CT chest 11/23/2020 that shows mild bibasilar bronchiectasis without significant change in similar bronchial appearance when  compared to 2020 on my interpretation.  PMH: Hypothyroidism, hypertension, GERD, history of bladder cancer Surgical history: Hysterectomy, cholecystectomy, knee replacement bilaterally, nephrectomy Family history: Mother with hypertension Social history: Never smoker, lives in [place] / Pulmonary Flowsheets:   ACT:      No data to display           MMRC:     No data to display           Epworth:      No data to display           Tests:   FENO:  No results found for: "NITRICOXIDE"  PFT:    Latest Ref Rng & Units 04/19/2021    2:49 PM  PFT Results  FVC-Pre L 2.29   FVC-Predicted Pre % 72   FVC-Post L 2.00   FVC-Predicted Post % 63   Pre FEV1/FVC % % 73   Post FEV1/FCV % % 62   FEV1-Pre L 1.69   FEV1-Predicted Pre % 70   FEV1-Post L 1.24   DLCO uncorrected ml/min/mmHg 17.49   DLCO UNC% % 86   DLCO corrected ml/min/mmHg 17.49   DLCO COR %Predicted % 86   DLVA Predicted % 129   Personally reviewed and interpreted YW:VPXTGGYIRS without fixed obstruction but suggestive of mild restriction versus air trapping, no bronchodilator response, TLC moderately reduced with severely low ERV, but used nitrogen washout so could be underestimated, DLCO within normal limits. WALK:      No data to display  Imaging: Personally reviewed and as per EMR and discussion in this note No results found.  Lab Results: Personally reviewed, notably mild elevation of eosinophils present CBC    Component Value Date/Time   WBC 11.4 (H) 11/23/2020 1924   RBC 5.38 (H) 11/23/2020 1924   HGB 12.6 05/20/2021 1036   HCT 37.0 05/20/2021 1036   PLT 409 (H) 11/23/2020 1924   MCV 89.4 11/23/2020 1924   MCH 28.3 11/23/2020 1924   MCHC 31.6 11/23/2020 1924   RDW 13.9 11/23/2020 1924   LYMPHSABS 1.7 11/23/2020 1924   MONOABS 0.6 11/23/2020 1924   EOSABS 0.4 11/23/2020 1924   BASOSABS 0.1 11/23/2020 1924    BMET    Component Value Date/Time   NA  141 06/11/2021 1113   K 5.1 06/11/2021 1113   CL 104 06/11/2021 1113   CO2 19 (L) 06/11/2021 1113   GLUCOSE 94 06/11/2021 1113   GLUCOSE 105 (H) 11/23/2020 1924   BUN 53 (H) 06/11/2021 1113   CREATININE 2.81 (H) 06/11/2021 1113   CALCIUM 9.8 06/11/2021 1113   GFRNONAA 20 (L) 11/23/2020 1924   GFRAA 5 (L) 10/04/2019 1018    BNP    Component Value Date/Time   BNP 71.1 08/12/2018 2306    ProBNP No results found for: "PROBNP"  Specialty Problems       Pulmonary Problems   Hypoxia   Hemoptysis   Exertional dyspnea    Allergies  Allergen Reactions   Iodinated Contrast Media Other (See Comments)    Was told to avoid after being diagnosed with stage 3 kidney disease   Sulfamethoxazole-Trimethoprim Swelling    Facial swelling and rash   Ciprofloxacin Nausea And Vomiting   Sulfacetamide Sodium Swelling    Immunization History  Administered Date(s) Administered   Influenza Split 11/19/2019   Influenza, High Dose Seasonal PF 11/14/2019, 12/22/2020   Influenza,inj,quad, With Preservative 01/05/2018   PFIZER Comirnaty(Gray Top)Covid-19 Tri-Sucrose Vaccine 06/12/2019, 07/10/2019, 11/26/2019   PFIZER(Purple Top)SARS-COV-2 Vaccination 06/12/2019, 07/10/2019, 11/26/2019   Pneumococcal Conjugate-13 09/19/2016   Pneumococcal Polysaccharide-23 12/19/2014, 01/09/2018   Tdap 03/21/2013   Zoster Recombinat (Shingrix) 11/26/2019   Zoster, Live 11/26/2019    Past Medical History:  Diagnosis Date   Bladder cancer ([place])    bladder cancer   GERD (gastroesophageal reflux disease)    Hypertension    Pancreatic mass    Renal disorder    Thyroid disease    Vulvar pain 06/02/2021    Tobacco History: Social History   Tobacco Use  Smoking Status Never  Smokeless Tobacco Never   Counseling given: Not Answered   Continue to not smoke  Outpatient Encounter Medications as of 12/16/2021  Medication Sig   albuterol (VENTOLIN HFA) 108 (90 Base) MCG/ACT inhaler Inhale 2 puffs into  the lungs every 6 (six) hours as needed for wheezing or shortness of breath.   amLODipine (NORVASC) 2.5 MG tablet Take 1 tablet (2.5 mg total) by mouth daily.   bimatoprost (LUMIGAN) 0.01 % SOLN Place 1 drop into both eyes at bedtime.   doxycycline (VIBRA-TABS) 100 MG tablet Take 1 tablet (100 mg total) by mouth 2 (two) times daily for 14 days.   fluticasone (FLONASE) 50 MCG/ACT nasal spray Place 1 spray into the nose daily.   furosemide (LASIX) 20 MG tablet Take 1 tablet (20 mg total) by mouth daily. (Patient taking differently: Take 20 mg by mouth daily. Take 2 tabs)   gabapentin (NEURONTIN) 300 MG capsule Take 300 mg by mouth 2 (two) times daily  as needed (pain).   levothyroxine (SYNTHROID) 150 MCG tablet Take 150 mcg by mouth daily before breakfast.   ondansetron (ZOFRAN) 4 MG tablet Take 4 mg by mouth every morning.   pantoprazole (PROTONIX) 40 MG tablet Take 1 tablet (40 mg total) by mouth 2 (two) times daily. (Patient taking differently: Take 80 mg by mouth daily.)   predniSONE (DELTASONE) 20 MG tablet Take 2 tablets (40 mg total) by mouth daily with breakfast for 5 days.   Probiotic Product (ALIGN) 4 MG CAPS Take 1 capsule by mouth daily.   Propylene Glycol (SYSTANE COMPLETE) 0.6 % SOLN Place 1 drop into both eyes daily as needed (dry eyes).   rOPINIRole (REQUIP) 2 MG tablet Take 1 tablet (2 mg total) by mouth at bedtime as needed (restless legs). (Patient taking differently: Take 2 mg by mouth at bedtime.)   sertraline (ZOLOFT) 50 MG tablet Take 50 mg by mouth daily.   traMADol (ULTRAM) 50 MG tablet Take 50 mg by mouth 2 (two) times daily as needed for severe pain.   Fluticasone-Umeclidin-Vilant (TRELEGY ELLIPTA) 200-62.5-25 MCG/ACT AEPB Inhale 1 puff into the lungs daily.   No facility-administered encounter medications on file as of 12/16/2021.     Review of Systems  Review of Systems  N/a Physical Exam  BP 132/88 (BP Location: Left Arm, Cuff Size: Large)   Pulse 84   Ht '5\' 4"'$   (1.626 m)   Wt 214 lb 3.2 oz (97.2 kg)   SpO2 96%   BMI 36.77 kg/m   Wt Readings from Last 5 Encounters:  12/16/21 214 lb 3.2 oz (97.2 kg)  05/20/21 220 lb (99.8 kg)  05/14/21 225 lb 3.2 oz (102.2 kg)  05/10/21 218 lb (98.9 kg)  04/19/21 218 lb (98.9 kg)    BMI Readings from Last 5 Encounters:  12/16/21 36.77 kg/m  05/20/21 36.61 kg/m  05/14/21 37.48 kg/m  05/10/21 35.19 kg/m  04/19/21 35.19 kg/m     Physical Exam General: Ill-appearing, sitting in chair Eyes: EOMI, no icterus Neck: Supple, no JVP Cardiovascular: Regular rate and rhythm, no murmur Pulmonary: Coughing throughout, easily reproduced with deep breath, bilateral scattered wheezing  Abdomen: Nondistended, bowel sounds present MSK: No synovitis, no joint effusion Neuro: Normal gait, no weakness Psych: Normal mood, full affect   Assessment & Plan:   Wheeze, cough, shortness of breath: Suspect acute bronchitis and possible exacerbation of underlying asthma.  Prednisone 40 mg daily x5 days.  Antibiotics as below.  Chest x-ray appears relatively clear on my review without focal infiltrate to suggest pneumonia.  Bronchiectasis with exacerbation: Mild bibasilar.  With sputum production and cough today on the side of antibiotics for possible bronchitic exacerbation in the setting of bronchitis and possible asthma exacerbation.  Doxycycline 100 mg twice daily x14 days prescribed.  Return in about 3 months (around 03/17/2022).   Lanier Clam, MD 12/16/2021

## 2021-12-21 ENCOUNTER — Telehealth: Payer: Self-pay | Admitting: Family Medicine

## 2021-12-21 NOTE — Telephone Encounter (Signed)
Call went directly to voice mail.  Left message that I was calling to set up an appointment and left number for call back.  Damaris Hippo FNP-C

## 2021-12-21 NOTE — Progress Notes (Signed)
Chest xray is clear which is good

## 2021-12-27 DIAGNOSIS — Z936 Other artificial openings of urinary tract status: Secondary | ICD-10-CM | POA: Diagnosis not present

## 2022-01-08 ENCOUNTER — Other Ambulatory Visit: Payer: Self-pay | Admitting: Cardiovascular Disease

## 2022-01-08 DIAGNOSIS — R0602 Shortness of breath: Secondary | ICD-10-CM

## 2022-02-02 ENCOUNTER — Other Ambulatory Visit: Payer: Medicare Other | Admitting: Family Medicine

## 2022-02-02 VITALS — BP 160/90 | HR 78 | Resp 20

## 2022-02-02 DIAGNOSIS — F339 Major depressive disorder, recurrent, unspecified: Secondary | ICD-10-CM

## 2022-02-02 DIAGNOSIS — I10 Essential (primary) hypertension: Secondary | ICD-10-CM

## 2022-02-02 DIAGNOSIS — Z599 Problem related to housing and economic circumstances, unspecified: Secondary | ICD-10-CM

## 2022-02-02 DIAGNOSIS — R079 Chest pain, unspecified: Secondary | ICD-10-CM

## 2022-02-03 ENCOUNTER — Encounter: Payer: Self-pay | Admitting: Family Medicine

## 2022-02-03 DIAGNOSIS — F339 Major depressive disorder, recurrent, unspecified: Secondary | ICD-10-CM | POA: Insufficient documentation

## 2022-02-03 DIAGNOSIS — Z599 Problem related to housing and economic circumstances, unspecified: Secondary | ICD-10-CM | POA: Insufficient documentation

## 2022-02-03 NOTE — Progress Notes (Signed)
[facility] Consult Note Telephone: [phone]  Fax: [phone]    Date of encounter: 02/02/22 3:10 PM PATIENT NAME: Shelia Cruz [address] [place] [id number]   [phone] (home)  DOB: 11-Aug-1950 MRN: [id number] PRIMARY CARE PROVIDER:    Kathyrn Lass, MD,  [place] [address] [phone]  REFERRING PROVIDER:   Kathyrn Cruz, [place],  [place] [address] [phone]  RESPONSIBLE PARTY:    Contact Information     Name Relation Home Work Mobile   Shelia Cruz Daughter [phone]  [phone]   Shelia Cruz Daughter [phone]  [phone]        I met face to face with patient in her home. Palliative Care was asked to follow this patient by consultation request of  Shelia Lass, MD to address advance care planning and complex medical decision making. This is a follow up visit   ASSESSMENT , SYMPTOM MANAGEMENT AND PLAN / RECOMMENDATIONS:   Recurrent major depression Contracted for safety to call for assistance prior to attempting any efforts to take her life. Discuss with PCP increasing her Zoloft from 50 to 100 mg daily. SW referral to help with identifying counseling services. Follow up in 2 weeks.  2.  Chest pain at rest Has known CAD and pulmonary hypertension Unable to take NSAIDs, respiratory bronchospasms Question if able to take beta blocker, not a candidate for ACE-I/ARB due to CKD stage IV  3.  Hypertension Reports compliance with meds-Amlodipine 2.5 mg Needs monitoring and if remains elevated, may want to consider increasing Amlodipine dose and/or possibly long acting nitrate like Imdur.  4. Inadequate community resources Provided pt with number for Mom's meals to see if she qualifies. SW referral to help identify remote counseling services and possible assistance with food stamps application  Advance Care  Planning/Goals of Care:  CODE STATUS: Last code status was full code     Follow up Palliative Care Visit: Palliative care will continue to follow for complex medical decision making, advance care planning, and clarification of goals. Return 2 weeks or prn.    This visit was coded based on medical decision making (MDM).  PPS: 60%  HOSPICE ELIGIBILITY/DIAGNOSIS: TBD  Chief Complaint:  Palliative Care is continuing to follow patient for chronic medical management in setting of pulmonary hypertension, difficulty coping and to assist with advanced care planning, refining and defining goals of care.   HISTORY OF PRESENT ILLNESS:  Shelia Cruz is a 71 y.o. year old female with pulmonary hypertension and DOE, hx of bronchiectasis, GERD, nausea and vomiting, hypothyroidism, hx of bladder and renal cancer with left nephrectomy with stage IV CKD, resting CP, pancreatic mass, back pain and vulvar pain. Pt reports recent hospitalization for bronchitis and states now they have found fluid "in my heart".  States being unable to ambulate for significant distances due to DOE and fatigue.  States "I'm tired of all this and it just doesn't seem to get any better."  States her daughter married and moved from [place] to [place], bought a house with her husband and hasn't been around for a long while.  She states that she feels like "nobody cares if I am around." She had a counselor lined up to do remote counseling and before the first appointment she says the counselor called and told her she didn't think they would be a good match.  Her insurance is changing next year and they won't cover  her cardiologist whom she loves.  She has an apartment with new owners who are making changes and going to begin charging them for their water, increasing the rent and charging a pet rent.  She has a cat. She has groceries delivered because getting up and down the stairs has become too difficult for her.  Waited over  a year on the waiting list for meals on wheels only to be told she lives too far outside the service area.  Has been trying to apply for food stamps but was sent an 18 page application that she has to go to [employer] to print at 10 cents a page. She states thinking about killing herself at times, has even thought she would slit her throat but says she couldn't do that to her kids. She contracts for safety.   History obtained from review of EMR, discussion with Shelia Cruz.   04/22/21 CT Chest wo contrast: FINDINGS: Cardiovascular: Mild atherosclerosis of the thoracic aorta. No aortic aneurysm. Mild prominence of the central pulmonary artery at 3.4 cm. Upper normal heart size. No pericardial effusion.   Mediastinum/Nodes: No enlarged mediastinal lymph nodes. Limited assessment for hilar adenopathy on this unenhanced exam, there is no bulky hilar adenopathy. No esophageal wall thickening.   Lungs/Pleura: There are tiny punctate nodules throughout the right ung, primarily subpleural, some of which may be calcified. These are all unchanged from 2020 chest CT and considered benign. There are no new, enlarging, or suspicious pulmonary nodules. No consolidation or focal airspace disease. No pleural effusion. The trachea and central bronchi are patent without wall thickening. There is no bronchiectasis. Mild compressive atelectasis in the medial right lower lobe adjacent to thoracic osteophytes.   Upper Abdomen: Cholecystectomy clips. No acute findings in the included upper abdomen.   Musculoskeletal: Thoracic spondylosis with anterior spurring. Slight scoliosis. No acute osseous findings or focal bone lesion. Chest wall soft tissues are unremarkable.   IMPRESSION: 1. No acute findings or explanation for shortness of breath. No bronchiectasis. 2. Mild dilatation of the central pulmonary artery, can be seen with pulmonary arterial hypertension. 3. There is scattered tiny punctate nodules throughout  the right lung. These are all unchanged from 2020 chest CT and considered benign.  04/22/21 echo: IMPRESSIONS    1. Left ventricular EF 50 to 55%. The left ventricle has low normal function and demonstrates  regional wall motion abnormalities (see scoring diagram/findings for description). Mild concentric  left ventricular hypertrophy. Grade I diastolic dysfunction (impaired relaxation).  Mild hypokinesis of the left ventricular, basal-mid inferoseptal wall and inferior wall.   2. Right ventricular systolic function and size are normal. Normal pulmonary artery systolic pressure.   3. Left atrial size was severely dilated.   4. The mitral valve is normal in structure with no regurgitation or stenosis.   5. The aortic valve is tricuspid without regurgitation or stenosis.   6. The inferior vena cava is dilated in size with >50% respiratory variability, suggesting right atrial pressure of 8 mmHg.   Comparison(s): EF 50%, LV moderate hypokinesis of the left ventricular, basal-mid inferoseptal wall and inferior wall.   06/07/21 Cardiac cath right heart: Mild pulmonary hypertension, with mean PA pressure 25 mmHg, WHO group 2 or possibly group 5. Pulmonary capillary wedge mean 20 mmHg. Mildly elevated right atrial mean pressure Pulmonary vascular resistance 0.58 Woods units Main pulmonary artery O2 saturation 77% Systemic arterial simultaneous O2 saturation 95% Cardiac output 8.68 L/min with index of 4.21 L/min/m Component 05/27/21 05/21/20 11/07/19 09/19/19  09/05/19 08/21/19  Sodium 139 137 141 -- 138 139  Potassium 4.9  4.3  4.3  -- 5.2  5.1   Chloride 110 106 106 -- 104 103  CO2 19 Low  24 26 -- 28 24  BUN 41 High  43 High  48 High  -- 36 High  39 High   Glucose 146 High   87  107 High   -- 81  98   Creatinine 2.87 High   2.51 High  3.32 High  -- 2.65 High  2.81 High   Calcium 8.8 9.6 9.8 -- 10.0 10.3  Total Protein 7.0  -- -- 6.2  -- --  Albumin 4.1  -- -- -- -- --  Total Bilirubin 0.4   -- -- -- -- --  Alkaline Phosphatase 124 High  -- -- -- -- --  AST (SGOT) 18  -- -- -- -- --  ALT (SGPT) 10 -- -- -- -- --  Anion Gap _0 -- 6 12  Est. GFR 17 Low   20 Low   -- -- -- --  Est. GFR Non-African American -- -- 14 Low   -- 18 Low   17 Low     05/27/21 CT abd/pelvis wo contrast: CT ABDOMEN PELVIS WO CONTRAST (ROUTINE), 05/27/2021 12:08 PM  INDICATION:bladder cancer staging \ C67.2 Malignant neoplasm of lateral wall of urinary bladder (HCC) \ C65.2 Cancer of left renal pelvis (HCC) COMPARISON: CT from 05/21/2020  TECHNIQUE: Multislice axial images were obtained through the abdomen and pelvis without administration of iodinated intravenous contrast material. Multi-planar reformatted images were generated for additional analysis. Nongated technique limits cardiac detail.  All CT scans at [facility] and [facility] are performed using radiation dose optimization techniques as appropriate to a performed exam, including but not limited to one or more of the following: automatic exposure control, adjustment of the mA and/or kV according to patient size, use of iterative reconstruction technique. In addition, our institution participates in a radiation dose monitoring program to optimize patient radiation exposure.  FINDINGS:  LOWER CHEST: .  Mediastinum: Within normal limits. .  Heart/vessel: Normal heart size. No pericardial effusion. .  Lungs: Within normal limits. .  Pleura: Within normal limits.  ABDOMEN: .  Liver: Within normal limits. .  Gallbladder/biliary: Cholecystectomy .  Spleen: Within normal limits. .  Pancreas: Fatty atrophy of the distal pancreas .  Adrenals: Within normal limits. .  Kidneys: Prior left nephrectomy changes. Right nephroureteral stent in similar position. No hydronephrosis on the right .  Peritoneum: Within normal limits. .  Mesentery: Within normal limits. .  Extraperitoneum: Within normal limits. .  GI tract:  No evidence of bowel obstruction. Diverticulosis without diverticulitis .  Vascular: Atherosclerotic changes  PELVIS: .  Peritoneum: Within normal limits. .  Extraperitoneum: Within normal limits. .  Ureters: Within normal limits. .  Bladder: Within normal limits. .  Reproductive system: Hysterectomy and bilateral salpingo-oophorectomy changes .  Vascular: As above  MSK: .  Degenerative changes without concerning focal bony lesion. Similar right lateral abdominal wall hernia best seen on image 106 series 301. Similar left inguinal fat-containing hernia. Procedure Note  Jacqulynn Cadet, MD - 05/27/2021 Formatting of this note might be different from the original. CT ABDOMEN PELVIS WO CONTRAST (ROUTINE), 05/27/2021 12:08 PM  INDICATION:bladder cancer staging \ C67.2 Malignant neoplasm of lateral wall of urinary bladder ([facility]) \ C65.2 Cancer of left renal pelvis ([facility]) COMPARISON: CT from 05/21/2020  FINDINGS:  LOWER CHEST: .  Mediastinum: Within normal limits. .  Heart/vessel: Normal heart size. No pericardial effusion. .  Lungs: Within normal limits. .  Pleura: Within normal limits.  ABDOMEN: .  Liver: Within normal limits. .  Gallbladder/biliary: Cholecystectomy .  Spleen: Within normal limits. .  Pancreas: Fatty atrophy of the distal pancreas .  Adrenals: Within normal limits. .  Kidneys: Prior left nephrectomy changes. Right nephroureteral stent in similar position. No hydronephrosis on the right .  Peritoneum: Within normal limits. .  Mesentery: Within normal limits. .  Extraperitoneum: Within normal limits. .  GI tract: No evidence of bowel obstruction. Diverticulosis without diverticulitis .  Vascular: Atherosclerotic changes  PELVIS: .  Peritoneum: Within normal limits. .  Extraperitoneum: Within normal limits. .  Ureters: Within normal limits. .  Bladder: Within normal limits. .  Reproductive system: Hysterectomy and bilateral salpingo-oophorectomy  changes .  Vascular: As above  MSK: .  Degenerative changes without concerning focal bony lesion. Similar right lateral abdominal wall hernia best seen on image 106 series 301. Similar left inguinal fat-containing hernia.  CONCLUSION:  Similar postoperative changes without recurrent disease. Similar ancillary findings Has routine change of right retrograde ileal conduit nephroureteral stent exchange by IR, last noted on 08/25/21.  12/16/21 CXR 2 view: IMPRESSION: No acute cardiopulmonary disease.  I reviewed EMR for available labs, medications, imaging, studies and related documents.  There are no new records since last visit/Records reviewed and summarized above.   ROS General: NAD EYES: denies vision changes ENMT: denies dysphagia Cardiovascular: endorses almost daily chest pain, and DOE Pulmonary: endorses cough and feeling something in her throat, denies increased SOB Abdomen: endorses poor appetite, denies constipation, endorses continence of bowel GU: denies dysuria, endorses continence of urine MSK:  endorses generalize weakness/easy fatigability, no falls reported Skin: denies rashes or wounds Neurological: endorses back, left flank and vulvar pain with feeling of pressure, denies insomnia Psych: Endorses depressive mood and intermittent suicidal ideation with a plan but reports being afraid of pain. Heme/lymph/immuno: denies bruises, abnormal bleeding  Physical Exam: Current and past weights: 214 lbs 3.2 oz as of 11/26/21, 225 lbs 3.2 oz as of 05/14/21 Constitutional: NAD General: WD/obese  CV: S1S2, RRR, 1+ BLE edema Pulmonary: CTAB with increased upper airway noise, no increased work of breathing, no cough, room air Abdomen: normo-active BS + 4 quadrants, soft and non tender, no ascites GU: deferred MSK: no sarcopenia, moves all extremities, ambulatory with cane or walker Skin: warm and dry, small areas where skin has been scratched on both upper arms and lower  shin Neuro:  no generalized weakness,  no cognitive impairment Psych: depressed affect, A and O x 3 Hem/lymph/immuno: no widespread bruising   Thank you for the opportunity to participate in the care of Ms. Arrambide.  The palliative care team will continue to follow. Please call our office at [phone] if we can be of additional assistance.   Marijo Conception, FNP -C  COVID-19 PATIENT SCREENING TOOL Asked and negative response unless otherwise noted:   Have you had symptoms of covid, tested positive or been in contact with someone with symptoms/positive test in the past 5-10 days?  No

## 2022-02-08 IMAGING — CT CT ABD-PELV W/O CM
2 of 4 series · 15 of 46 positions shown, 17 images · non-contrast
Comparison: 09/30/2019

CLINICAL DATA: Status post nephrectomy, hysterectomy, cystectomy
with urostomy and ureteral stent placement. Pelvic pain, vaginal
pain, hematuria.

EXAM:
CT ABDOMEN AND PELVIS WITHOUT CONTRAST
TECHNIQUE: Multidetector CT imaging of the abdomen and pelvis was performed
following the standard protocol without IV contrast.

[Series 2: axial st · axial · 0.83mm/px · z∈[-432,-52]mm · 12 of 88 slices shown, 14 images]
[im 6/88  soft-tissue]
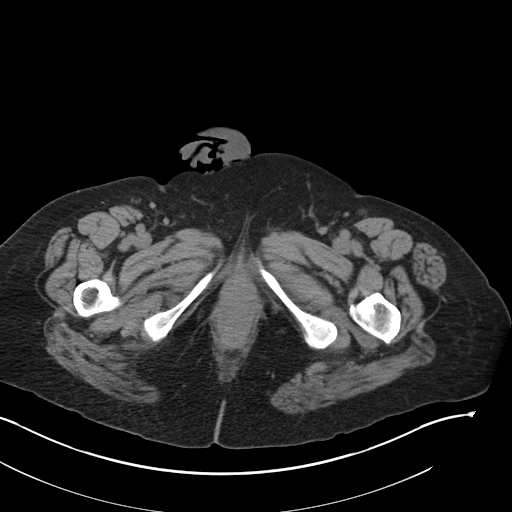
[im 6/88  bone]
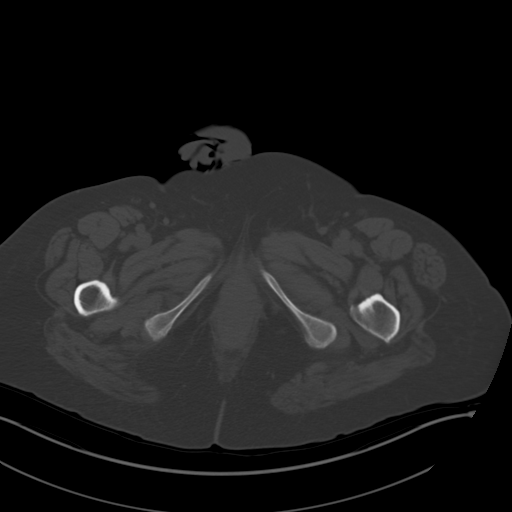
[im 16/88  soft-tissue]
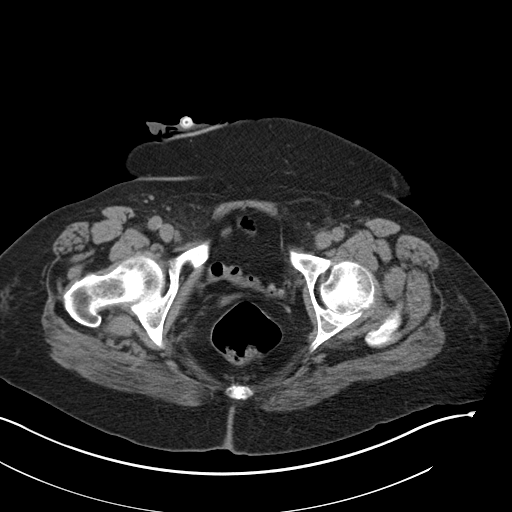
[im 21/88  soft-tissue]
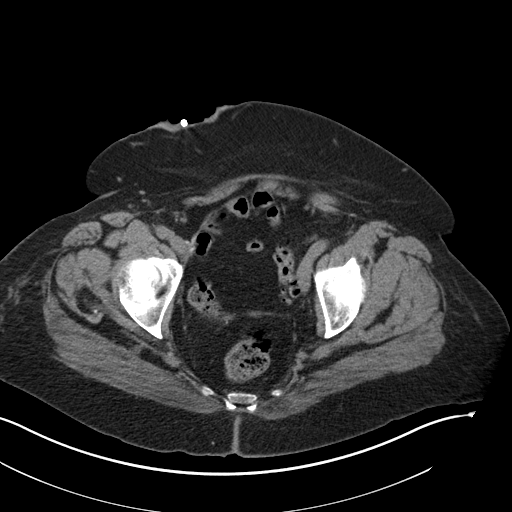
[im 26/88  soft-tissue]
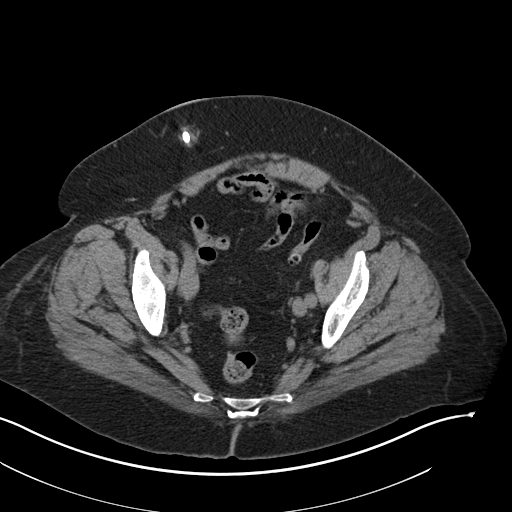
[im 36/88  soft-tissue]
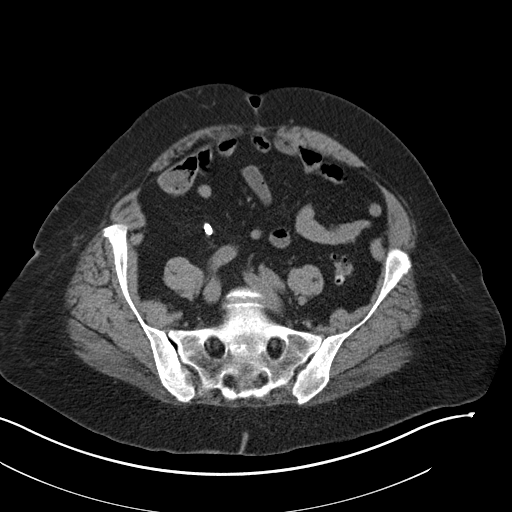
[im 41/88  soft-tissue]
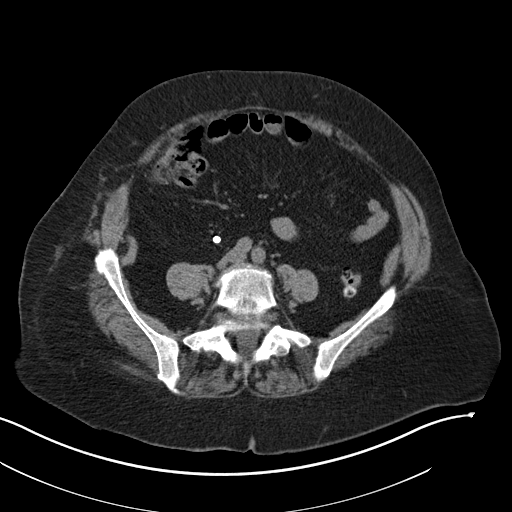
[im 47/88  soft-tissue]
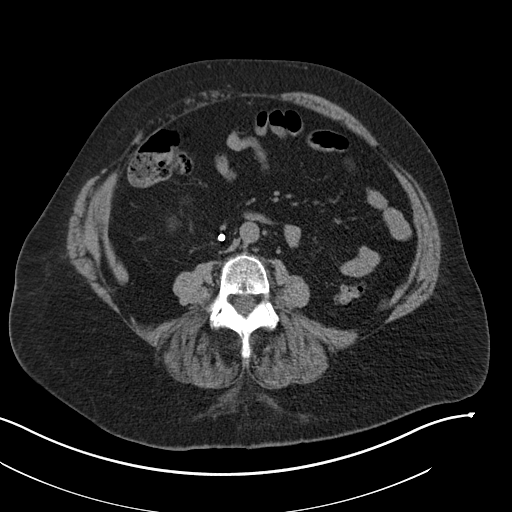
[im 57/88  soft-tissue]
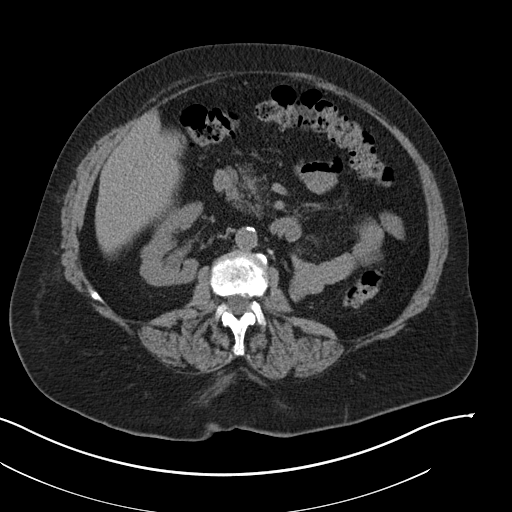
[im 62/88  soft-tissue]
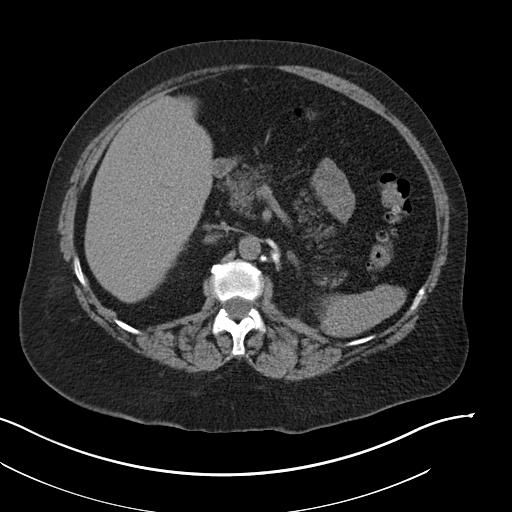
[im 62/88  bone]
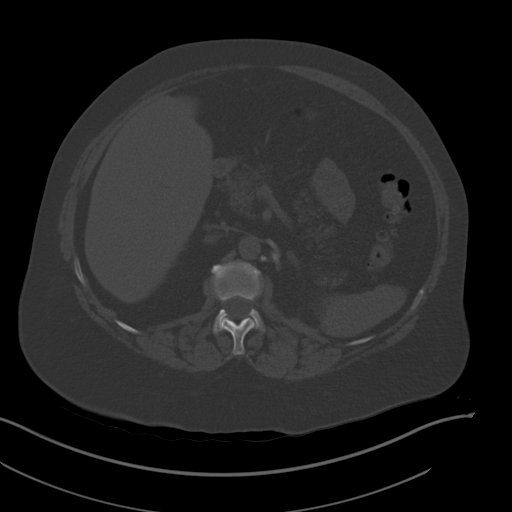
[im 67/88  soft-tissue]
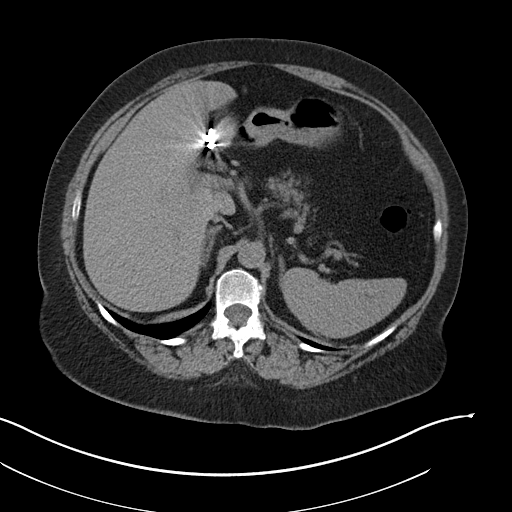
[im 77/88  soft-tissue]
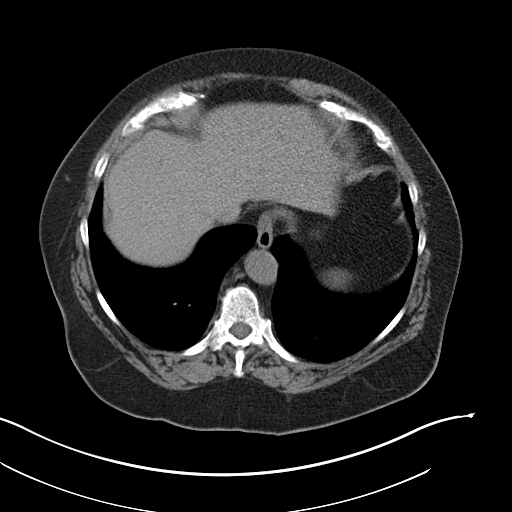
[im 82/88  soft-tissue]
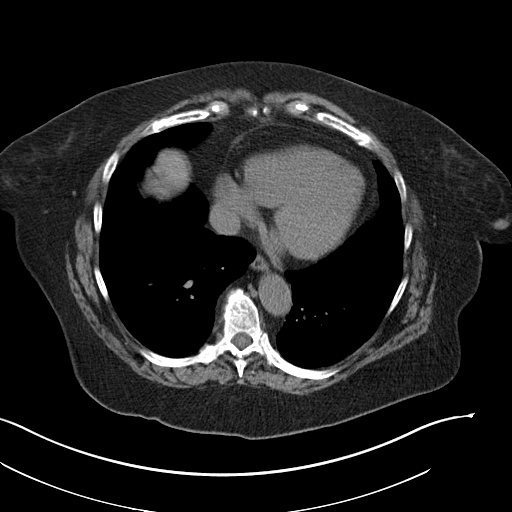

[Series 5: coronal st · coronal · 0.79mm/px · 3 of 171 slices shown]
[im 57/171  soft-tissue]
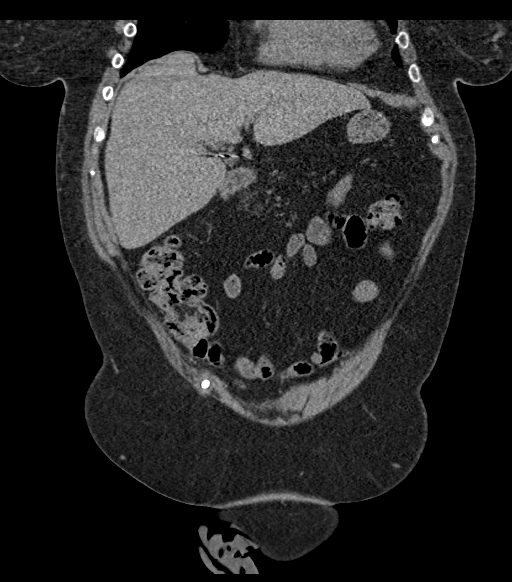
[im 76/171  soft-tissue]
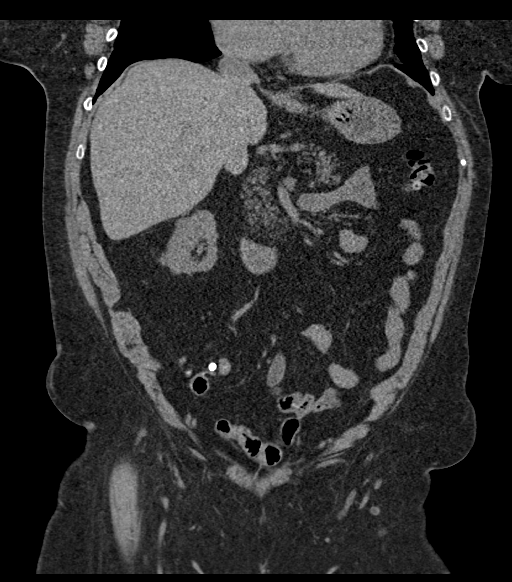
[im 95/171  soft-tissue]
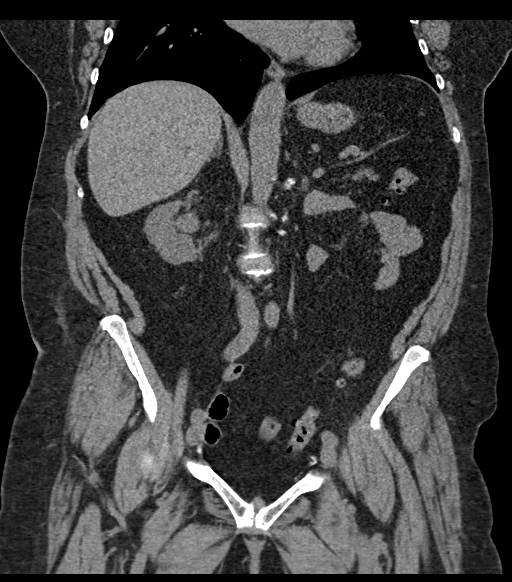

[15 of 46 positions shown; findings below may reference images not displayed]

FINDINGS: Lower chest: The visualized lung bases are clear. The visualized
heart and pericardium are unremarkable.

Hepatobiliary: No focal liver abnormality is seen. Status post
cholecystectomy. No biliary dilatation.

Pancreas: Unremarkable

Spleen: Unremarkable

Adrenals/Urinary Tract: The adrenal glands are unremarkable. Left
nephrectomy has been performed. Right kidney is normal in size and
position. Interval placement of a right ureteral stent, extending
from the right renal pelvis into the a right lower quadrant urostomy
appliance. Previously noted hydronephrosis has resolved. There is a
single dilated posterior interpolar calyx noted, however, best seen
on axial image # 33/2 and coronal image # 96/5 of unclear
significance. This may represent residual caliceal dilation or
sequela of a residual mild infundibular stenosis, however, un
underlying mass within the a collecting system can not be
definitively excluded on this noncontrast examination. No intrarenal
calcification. No perinephric fluid collections. Status post
cystectomy.

Stomach/Bowel: Stomach, small bowel, and appendix are unremarkable.
Moderate transverse, descending, and sigmoid colonic diverticulosis
without superimposed acute inflammatory change. The large bowel is
otherwise unremarkable. No free intraperitoneal gas or fluid.

Vascular/Lymphatic: Mild atherosclerotic calcification within the
abdominal aorta. No aortic aneurysm. No pathologic adenopathy within
the abdomen and pelvis.

Reproductive: Status post hysterectomy. No adnexal masses.

Other: Small fat containing left inguinal hernia. Rectum
unremarkable.

Musculoskeletal: No acute bone abnormality. No lytic or blastic bone
lesion. Degenerative changes are seen within the lumbar spine.
IMPRESSION: No acute intra-abdominal pathology identified. No definite
radiographic explanation for the patient's reported symptoms.

Status post left nephroureterectomy and cystectomy.

Status post retrograde ureteral stent placement via right lower
quadrant urostomy with resolution of hydronephrosis involving the
residual right kidney.

Focal interpolar caliceal dilation. See differential considerations
above. In a patient with ongoing hematuria, CT or MR urography or
ureteroscopy may be helpful to exclude an underlying mass of the
collecting system.

Moderate colonic diverticulosis.

Aortic Atherosclerosis ([id number]-7KA.A).

## 2022-02-17 ENCOUNTER — Encounter: Payer: Self-pay | Admitting: Family Medicine

## 2022-02-17 ENCOUNTER — Other Ambulatory Visit: Payer: Self-pay | Admitting: Family Medicine

## 2022-02-17 ENCOUNTER — Other Ambulatory Visit: Payer: Medicare Other | Admitting: Family Medicine

## 2022-02-17 VITALS — BP 128/86 | HR 74 | Temp 98.1°F | Resp 18

## 2022-02-17 DIAGNOSIS — Z8551 Personal history of malignant neoplasm of bladder: Secondary | ICD-10-CM | POA: Diagnosis not present

## 2022-02-17 DIAGNOSIS — R310 Gross hematuria: Secondary | ICD-10-CM | POA: Diagnosis not present

## 2022-02-17 DIAGNOSIS — R109 Unspecified abdominal pain: Secondary | ICD-10-CM | POA: Diagnosis not present

## 2022-02-17 DIAGNOSIS — Z905 Acquired absence of kidney: Secondary | ICD-10-CM | POA: Diagnosis not present

## 2022-02-17 DIAGNOSIS — F339 Major depressive disorder, recurrent, unspecified: Secondary | ICD-10-CM

## 2022-02-17 NOTE — Progress Notes (Signed)
[facility] Palliative Care Consult Note Telephone: [phone]  Fax: [phone]    Date of encounter: 02/17/22 12:32 PM PATIENT NAME: Shelia Cruz [address] [place] [address]   [phone] (home)  DOB: 1950/12/12 MRN: [id number] PRIMARY CARE PROVIDER:    Kathyrn Lass, MD,  [place] [address] [phone]  REFERRING PROVIDER:   Kathyrn Cruz, [place],  [place] [address] [phone]  RESPONSIBLE PARTY:    Contact Information     Name Relation Home Work Mobile   SheliaCruz Daughter [phone]  [phone]   Shelia Cruz Daughter [phone]  [phone]        I met face to face with patient in her home. Palliative Care was asked to follow this patient by consultation request of  Shelia Lass, MD to address advance care planning and complex medical decision making. This is a follow up visit   ASSESSMENT , SYMPTOM MANAGEMENT AND PLAN / RECOMMENDATIONS:   Intermittent gross hematuria in urostomy/hx of bladder wall cancer and left nephrectomy due to cancer/right flank pain Pt declined ER visit. Scheduled transport for CT abd and pelvis without contrast at [facility] tomorrow with results to be sent to this provider. If increased, uncontrolled pain, increased bleeding, lightheadedness or CP-pt should call 911 and proceed to ER. Advised if Tramadol 50 mg inadequate, she can take a 2nd Tramadol but then would have to wait 8 hours before being able to take it again. Encouraged to contact Dr Shelia Cruz to request transfer to [facility] and Urologic Oncologist.   Recurrent depression Has been unable to establish with local psychiatrist who will take her insurance. Has been on Sertraline long term and doesn't want to change. Continues to contract for safety.   Advance Care Planning/Goals of Care: Goals include to maximize quality of life  and symptom management. Patient gave her permission to discuss. Our advance care planning conversation included a discussion about:    Experiences with loved ones who have been seriously ill or have died  Exploration of personal, cultural or spiritual beliefs that might influence medical decisions-states she has no help.  One daughter lives near [place], [place] the other is local but has a new spouse and home. She states not having a lot of local support for her needs. States being tired of dealing with these issues affecting her health and does not want it to continue  CODE STATUS: Last code status was full     Follow up Palliative Care Visit: Palliative care will continue to follow for complex medical decision making, advance care planning, and clarification of goals. Return 4 weeks or prn.   This visit was coded based on medical decision making (MDM).  PPS: 60%  HOSPICE ELIGIBILITY/DIAGNOSIS: TBD  Chief Complaint:  Palliative Care is continuing to follow patient for chronic medical management in setting of bladder/renal cancer with urostomy in setting of hypoxia and pulmonary hypertension. Pt c/o colicky, right flank pain, intermittent nausea and frank hematuria in ostomy.  HISTORY OF PRESENT ILLNESS:  Shelia Cruz is a 71 y.o. year old female with hx of bladder cancer and renal cancer with urostomy. She has HTN, pulmonary HTN, hypoxia, GERD, anemia, restless leg syndrome, recurrent major depression, exertional dyspnea and hypothyroidism. She also has CKD stage IV and follows up with Dr Shelia Cruz, Nephrologist of [facility]. She was being followed by Dr Shelia Cruz with [facility]  but can no longer see him as he doesn't take her insurance.  Got her influenza and RSV vaccines.  Feeling SOB in general, more so today and fatigued. She spent Thanksgiving with her daughter and plans to see her youngest from [place] when she is in at  Christmas. Has a permanent stent in her kidney and has had bright red blood in her stoma pouch.  She has passed blood clots.  She is feeling generally worse than her normal, has definitive CVAT on right.  She says colicky pain is right flank, intermittent, and with intermittent frank bleeding. If pain gets severe enough she will take a Tramadol 50 mg tab. At times she is nauseous with the pain.  She has had some CP and lightheadedness intermittently when ambulating, none currently.  She states feeing at times like there is some pressure on her throat, almost always at night and it spontaneously goes away. She states having mentioned the intermittent blood in the stoma pouch to providers but no one had mentioned it.  Messaged Dr Shelia Cruz who was in agreement with pt having a CT without contrast to evaluate for stone or other source of bleeding and mass.  He encouraged that she follow up with Urology.  Spoke with inpatient Palliative Care counterpart at the [facility] who encouraged having pt to go through the ER to have testing done and set up for new Urologist and Oncologist or indicated pt could contact prior office and ask them to send a referral to local Urologist and Oncologist to get her started.  Pt was tearful stating she is overwhelmed having so much go on lately.  She had previously indicated some suicidal ideation but declined to follow through as this would hurt her family.  At that time she contracted for safety.  She has also lost her Cardiologist recently as he does not take her insurance either.  She spent Thanksgiving with her daughter and son-in-law and they made dinner which she says was very rich, brined a [place] and she said this was saltier than she is used to eating and the rich, spicy food upset her stomach so she had diarrhea.  She did not know how to tell her daughter after all the hard work how the [employer], spice and saltiness of the food had upset her stomach.  Does not  feel like daughter realizes how much help she currently needs and is resistant to talking with her about it.  History obtained from review of EMR, discussion with  Shelia Cruz.   I reviewed EMR for available labs, medications, imaging, studies and related documents.  There are no new records since last visit.   ROS General: NAD, endorses fatigue EYES: denies vision changes ENMT: denies dysphagia Cardiovascular: endorses fleeting chest pain, denies DOE Pulmonary: denies cough, endorses increased SOB Abdomen: endorses fair appetite, denies constipation, endorses continence of bowel GU:  has urostomy and intermittent frank bloody urine MSK:  endorses increased generalized weakness, no falls reported Skin: denies rashes or wounds Neurological: endorses right flank pain, denies insomnia Psych: Endorses depressed mood Heme/lymph/immuno: denies bruises, endorses abnormal bleeding in stoma pouch  Physical Exam: Current and past weights: 214 lbs 3.2 ounces as of 12/16/21, 222 lbs as of 05/27/21 Constitutional: NAD General: WD, overweight EYES: anicteric sclera, lids intact, no discharge.  Conjunctivae pink and without pallor ENMT: intact hearing, oral mucous membranes moist, dentition intact CV: S1S2, RRR, no LE edema Pulmonary: CTAB, no increased work of breathing, no cough,  room air Abdomen: normo-active BS + 4 quadrants, soft and non tender, + right side CVAT with sharp increase in pain following CVAT exam GU: deferred MSK: no sarcopenia, moves all extremities, ambulatory Skin: warm and dry, no rashes or wounds on visible skin Neuro:  no generalized weakness,  no cognitive impairment Psych: mod anxious affect, depressed and tearful. A and O x 3 Hem/lymph/immuno: no widespread bruising   Thank you for the opportunity to participate in the care of Ms. Knab.  The palliative care team will continue to follow. Please call our office at [phone] if we can be of additional assistance.    Marijo Conception, FNP -C  COVID-19 PATIENT SCREENING TOOL Asked and negative response unless otherwise noted:   Have you had symptoms of covid, tested positive or been in contact with someone with symptoms/positive test in the past 5-10 days?  No

## 2022-02-18 ENCOUNTER — Ambulatory Visit (HOSPITAL_BASED_OUTPATIENT_CLINIC_OR_DEPARTMENT_OTHER)
Admission: RE | Admit: 2022-02-18 | Discharge: 2022-02-18 | Disposition: A | Discharge status: Home / Self Care | Payer: Medicare Other | Source: Ambulatory Visit | Attending: Family Medicine | Admitting: Family Medicine

## 2022-02-18 ENCOUNTER — Encounter: Payer: Self-pay | Admitting: Family Medicine

## 2022-02-18 ENCOUNTER — Other Ambulatory Visit (HOSPITAL_BASED_OUTPATIENT_CLINIC_OR_DEPARTMENT_OTHER): Payer: Self-pay | Admitting: Family Medicine

## 2022-02-18 DIAGNOSIS — N184 Chronic kidney disease, stage 4 (severe): Secondary | ICD-10-CM | POA: Diagnosis not present

## 2022-02-18 DIAGNOSIS — Z905 Acquired absence of kidney: Secondary | ICD-10-CM | POA: Insufficient documentation

## 2022-02-18 DIAGNOSIS — K5730 Diverticulosis of large intestine without perforation or abscess without bleeding: Secondary | ICD-10-CM | POA: Diagnosis not present

## 2022-02-18 DIAGNOSIS — R109 Unspecified abdominal pain: Secondary | ICD-10-CM | POA: Insufficient documentation

## 2022-02-18 DIAGNOSIS — I700 Atherosclerosis of aorta: Secondary | ICD-10-CM | POA: Insufficient documentation

## 2022-02-18 DIAGNOSIS — R310 Gross hematuria: Secondary | ICD-10-CM | POA: Insufficient documentation

## 2022-02-18 DIAGNOSIS — N189 Chronic kidney disease, unspecified: Secondary | ICD-10-CM | POA: Diagnosis not present

## 2022-03-04 DIAGNOSIS — E039 Hypothyroidism, unspecified: Secondary | ICD-10-CM | POA: Diagnosis not present

## 2022-03-04 DIAGNOSIS — N184 Chronic kidney disease, stage 4 (severe): Secondary | ICD-10-CM | POA: Diagnosis not present

## 2022-03-04 DIAGNOSIS — I129 Hypertensive chronic kidney disease with stage 1 through stage 4 chronic kidney disease, or unspecified chronic kidney disease: Secondary | ICD-10-CM | POA: Diagnosis not present

## 2022-03-04 DIAGNOSIS — C679 Malignant neoplasm of bladder, unspecified: Secondary | ICD-10-CM | POA: Diagnosis not present

## 2022-03-04 DIAGNOSIS — K219 Gastro-esophageal reflux disease without esophagitis: Secondary | ICD-10-CM | POA: Diagnosis not present

## 2022-03-04 DIAGNOSIS — G2581 Restless legs syndrome: Secondary | ICD-10-CM | POA: Diagnosis not present

## 2022-03-07 ENCOUNTER — Telehealth: Payer: Self-pay | Admitting: Hematology and Oncology

## 2022-03-07 NOTE — Telephone Encounter (Signed)
Scheduled appointment per referral. Patient is aware of appointment date and time. Patient is aware to arrive 15 mins prior to appointment time and to bring updated insurance cards. Patient is aware of location.   

## 2022-03-11 IMAGING — CR DG CHEST 2V
2 series · 2 of 2 positions shown · non-contrast
Comparison: March 28, 2020.

CLINICAL DATA: Shortness of breath.

EXAM:
CHEST - 2 VIEW

[w chest pa]
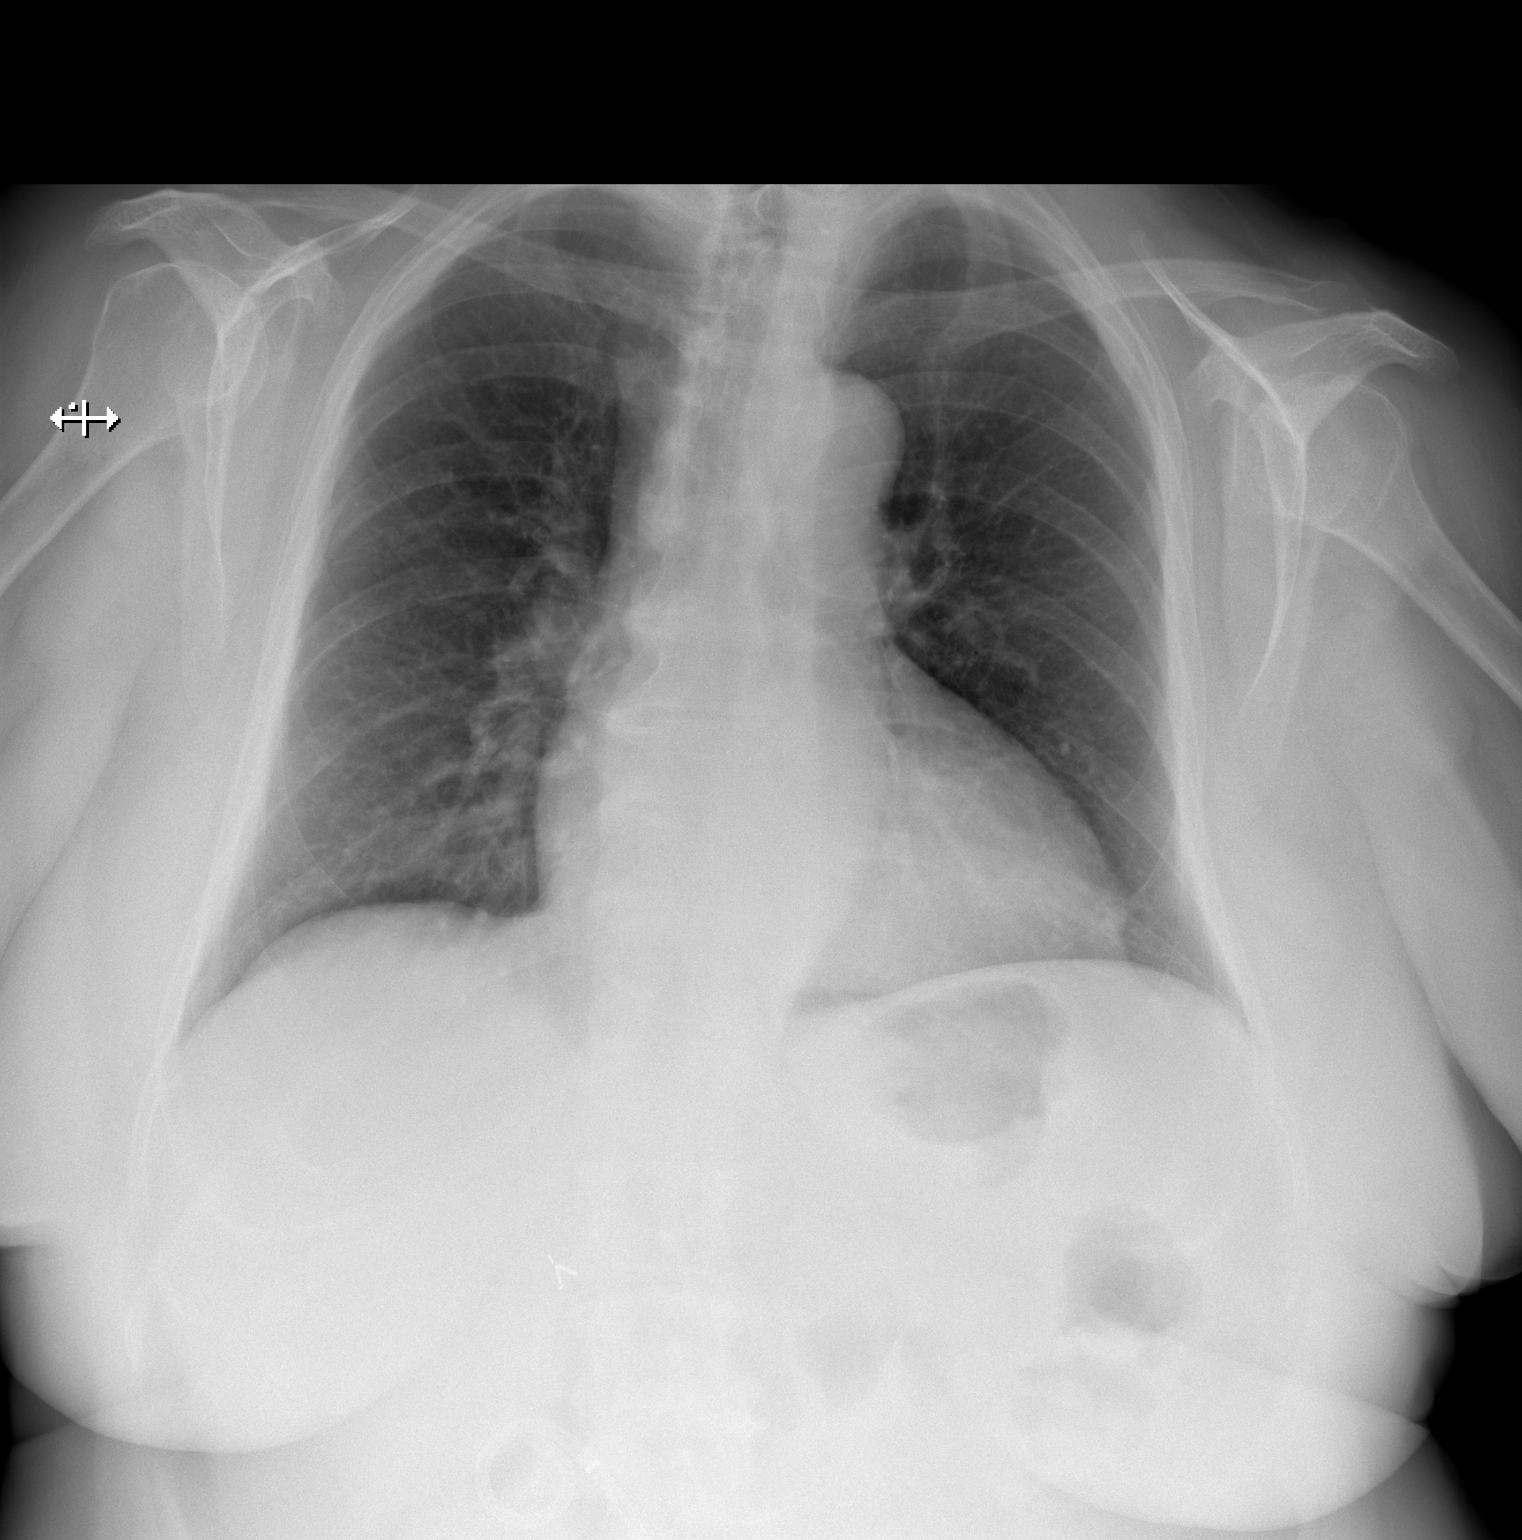

[w chest lat]
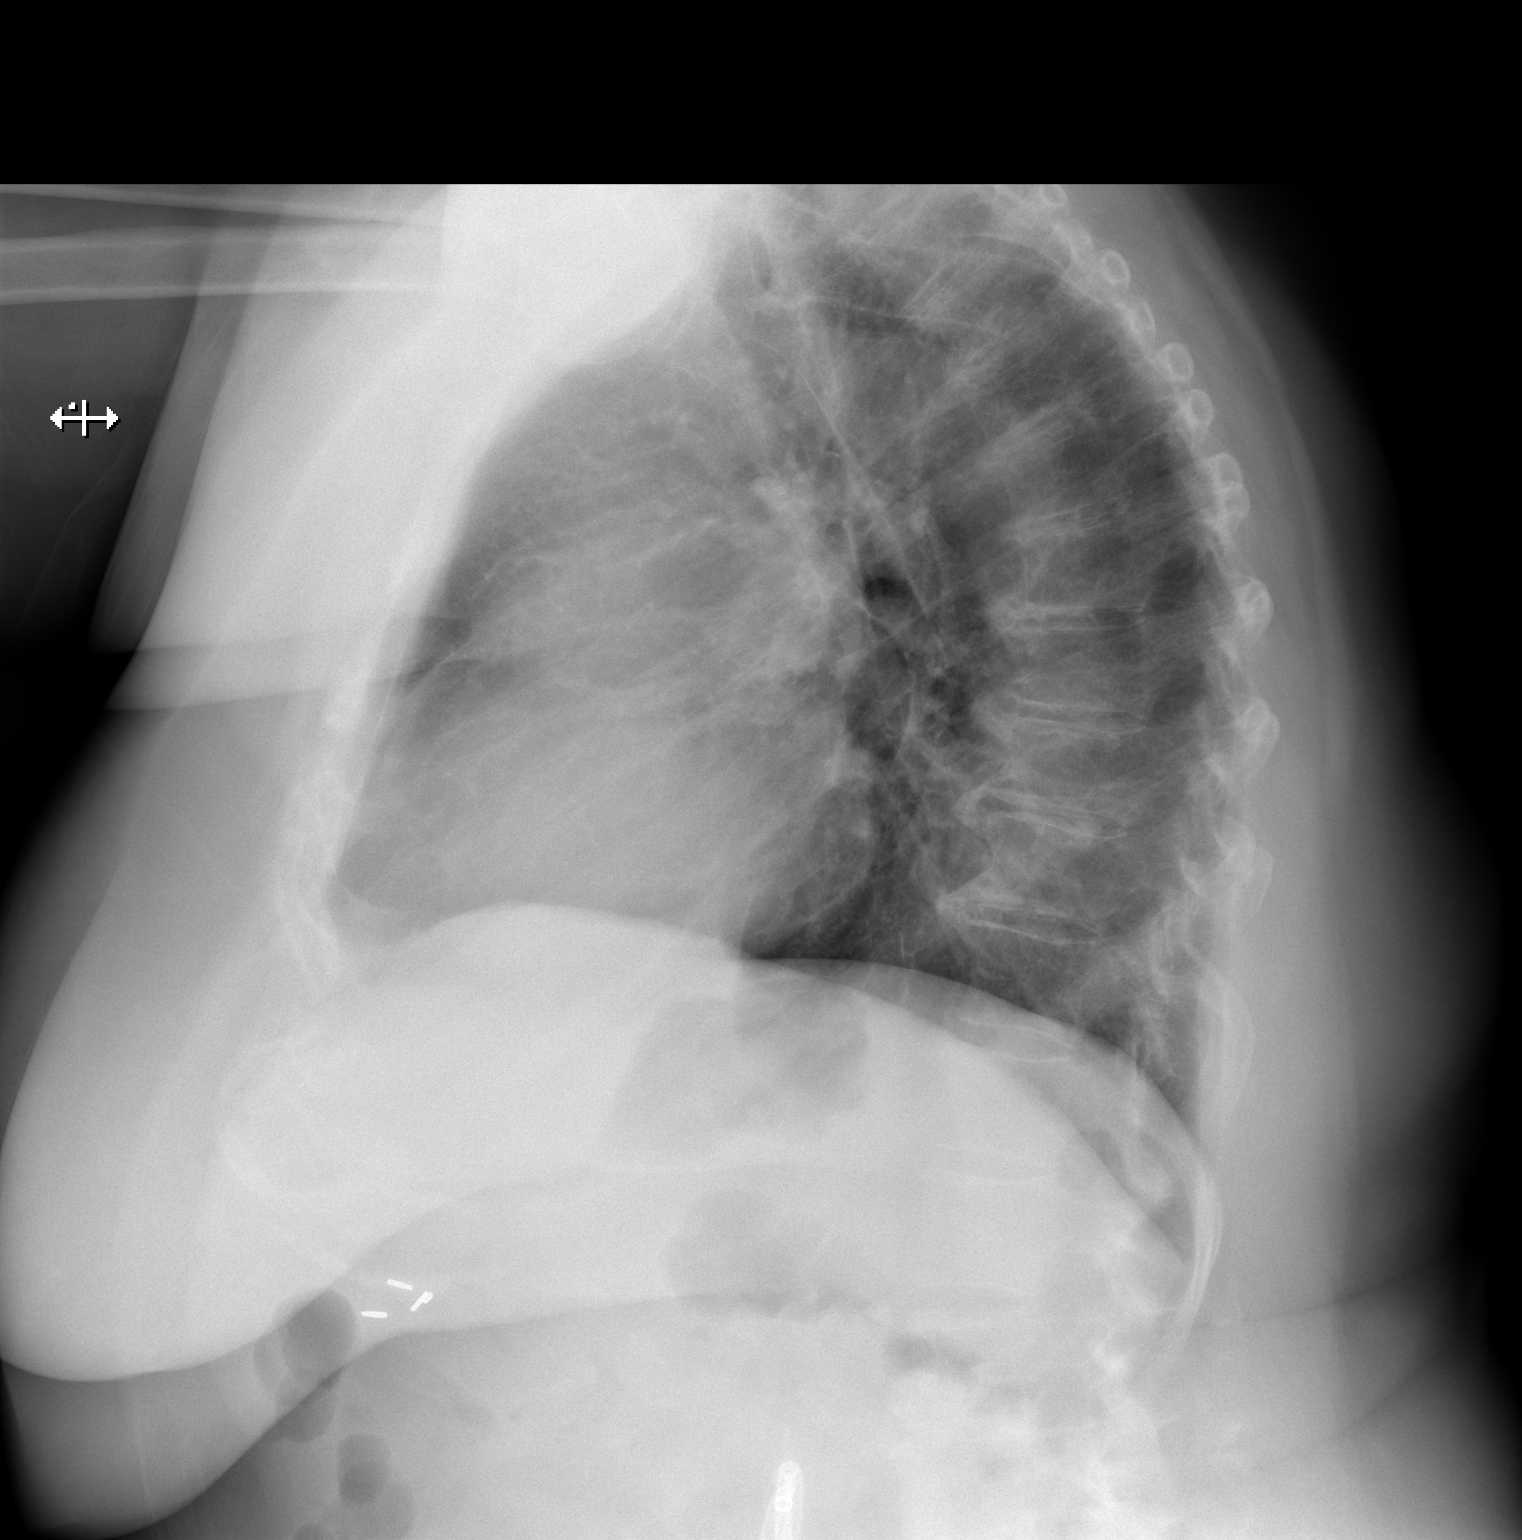

[2 of 2 positions shown; findings below may reference images not displayed]

FINDINGS: The heart size and mediastinal contours are within normal limits.
Both lungs are clear. The visualized skeletal structures are
unremarkable.
IMPRESSION: No active cardiopulmonary disease.

## 2022-03-19 ENCOUNTER — Other Ambulatory Visit: Payer: Self-pay | Admitting: Cardiovascular Disease

## 2022-03-19 DIAGNOSIS — R0602 Shortness of breath: Secondary | ICD-10-CM

## 2022-03-28 ENCOUNTER — Telehealth: Payer: Self-pay

## 2022-03-28 NOTE — Telephone Encounter (Signed)
1815 Palliative Care Note.  Attempted to return pt call from earlier today. No answer, left voice message with contact information.  Jacqulyn Cane, RN

## 2022-03-31 ENCOUNTER — Inpatient Hospital Stay: Payer: Medicare Other | Admitting: Hematology and Oncology

## 2022-03-31 ENCOUNTER — Inpatient Hospital Stay: Payer: Medicare Other

## 2022-04-06 ENCOUNTER — Other Ambulatory Visit: Payer: Medicare Other

## 2022-04-07 ENCOUNTER — Other Ambulatory Visit: Payer: Self-pay | Admitting: Cardiovascular Disease

## 2022-04-07 DIAGNOSIS — R0602 Shortness of breath: Secondary | ICD-10-CM

## 2022-04-08 DIAGNOSIS — Z936 Other artificial openings of urinary tract status: Secondary | ICD-10-CM | POA: Diagnosis not present

## 2022-04-11 DIAGNOSIS — Z936 Other artificial openings of urinary tract status: Secondary | ICD-10-CM | POA: Diagnosis not present

## 2022-04-12 DIAGNOSIS — R531 Weakness: Secondary | ICD-10-CM | POA: Diagnosis not present

## 2022-04-12 DIAGNOSIS — C679 Malignant neoplasm of bladder, unspecified: Secondary | ICD-10-CM | POA: Diagnosis not present

## 2022-04-12 DIAGNOSIS — N184 Chronic kidney disease, stage 4 (severe): Secondary | ICD-10-CM | POA: Diagnosis not present

## 2022-04-19 ENCOUNTER — Telehealth: Payer: Self-pay | Admitting: Hematology and Oncology

## 2022-04-19 NOTE — Telephone Encounter (Signed)
Pt called in to r/s appt with Dr. Lorenso Courier. Appt r/s, pt is aware.

## 2022-04-26 DIAGNOSIS — R296 Repeated falls: Secondary | ICD-10-CM | POA: Diagnosis not present

## 2022-04-26 DIAGNOSIS — G4700 Insomnia, unspecified: Secondary | ICD-10-CM | POA: Diagnosis not present

## 2022-04-28 DIAGNOSIS — N2581 Secondary hyperparathyroidism of renal origin: Secondary | ICD-10-CM | POA: Diagnosis not present

## 2022-04-28 DIAGNOSIS — N189 Chronic kidney disease, unspecified: Secondary | ICD-10-CM | POA: Diagnosis not present

## 2022-04-28 DIAGNOSIS — N184 Chronic kidney disease, stage 4 (severe): Secondary | ICD-10-CM | POA: Diagnosis not present

## 2022-04-28 DIAGNOSIS — C7911 Secondary malignant neoplasm of bladder: Secondary | ICD-10-CM | POA: Diagnosis not present

## 2022-04-28 DIAGNOSIS — I129 Hypertensive chronic kidney disease with stage 1 through stage 4 chronic kidney disease, or unspecified chronic kidney disease: Secondary | ICD-10-CM | POA: Diagnosis not present

## 2022-04-28 DIAGNOSIS — D631 Anemia in chronic kidney disease: Secondary | ICD-10-CM | POA: Diagnosis not present

## 2022-05-04 ENCOUNTER — Inpatient Hospital Stay: Payer: Medicare Other

## 2022-05-04 ENCOUNTER — Inpatient Hospital Stay: Payer: Medicare Other | Admitting: Hematology and Oncology

## 2022-05-12 ENCOUNTER — Telehealth: Payer: Self-pay | Admitting: Hematology and Oncology

## 2022-05-12 NOTE — Telephone Encounter (Signed)
R/s pt's appt per pt request. Pt is aware of new appt date/time.

## 2022-05-31 DIAGNOSIS — C652 Malignant neoplasm of left renal pelvis: Secondary | ICD-10-CM | POA: Diagnosis not present

## 2022-05-31 DIAGNOSIS — Z8551 Personal history of malignant neoplasm of bladder: Secondary | ICD-10-CM | POA: Diagnosis not present

## 2022-05-31 DIAGNOSIS — R310 Gross hematuria: Secondary | ICD-10-CM | POA: Diagnosis not present

## 2022-05-31 DIAGNOSIS — N131 Hydronephrosis with ureteral stricture, not elsewhere classified: Secondary | ICD-10-CM | POA: Diagnosis not present

## 2022-06-03 DIAGNOSIS — Z8551 Personal history of malignant neoplasm of bladder: Secondary | ICD-10-CM | POA: Diagnosis not present

## 2022-06-06 ENCOUNTER — Other Ambulatory Visit: Payer: Self-pay

## 2022-06-06 ENCOUNTER — Inpatient Hospital Stay: Payer: Medicare Other

## 2022-06-06 ENCOUNTER — Inpatient Hospital Stay: Payer: Medicare Other | Attending: Hematology and Oncology | Admitting: Hematology and Oncology

## 2022-06-06 VITALS — BP 120/68 | HR 86 | Temp 97.7°F | Resp 18 | Ht 64.0 in | Wt 214.5 lb

## 2022-06-06 DIAGNOSIS — R635 Abnormal weight gain: Secondary | ICD-10-CM | POA: Insufficient documentation

## 2022-06-06 DIAGNOSIS — Z8551 Personal history of malignant neoplasm of bladder: Secondary | ICD-10-CM | POA: Diagnosis not present

## 2022-06-06 DIAGNOSIS — R590 Localized enlarged lymph nodes: Secondary | ICD-10-CM | POA: Insufficient documentation

## 2022-06-06 DIAGNOSIS — D500 Iron deficiency anemia secondary to blood loss (chronic): Secondary | ICD-10-CM

## 2022-06-06 LAB — CBC WITH DIFFERENTIAL (CANCER CENTER ONLY)
Abs Immature Granulocytes: 0.17 10*3/uL — ABNORMAL HIGH (ref 0.00–0.07)
Basophils Absolute: 0.2 10*3/uL — ABNORMAL HIGH (ref 0.0–0.1)
Basophils Relative: 1 %
Eosinophils Absolute: 0.3 10*3/uL (ref 0.0–0.5)
Eosinophils Relative: 2 %
HCT: 43.3 % (ref 36.0–46.0)
Hemoglobin: 14.2 g/dL (ref 12.0–15.0)
Immature Granulocytes: 1 %
Lymphocytes Relative: 9 %
Lymphs Abs: 1.2 10*3/uL (ref 0.7–4.0)
MCH: 29.1 pg (ref 26.0–34.0)
MCHC: 32.8 g/dL (ref 30.0–36.0)
MCV: 88.7 fL (ref 80.0–100.0)
Monocytes Absolute: 0.8 10*3/uL (ref 0.1–1.0)
Monocytes Relative: 6 %
Neutro Abs: 11.2 10*3/uL — ABNORMAL HIGH (ref 1.7–7.7)
Neutrophils Relative %: 81 %
Platelet Count: 521 10*3/uL — ABNORMAL HIGH (ref 150–400)
RBC: 4.88 MIL/uL (ref 3.87–5.11)
RDW: 13.9 % (ref 11.5–15.5)
WBC Count: 13.7 10*3/uL — ABNORMAL HIGH (ref 4.0–10.5)
nRBC: 0 % (ref 0.0–0.2)

## 2022-06-06 LAB — CMP (CANCER CENTER ONLY)
ALT: 11 U/L (ref 0–44)
AST: 13 U/L — ABNORMAL LOW (ref 15–41)
Albumin: 4 g/dL (ref 3.5–5.0)
Alkaline Phosphatase: 135 U/L — ABNORMAL HIGH (ref 38–126)
Anion gap: 9 (ref 5–15)
BUN: 30 mg/dL — ABNORMAL HIGH (ref 8–23)
CO2: 25 mmol/L (ref 22–32)
Calcium: 9.2 mg/dL (ref 8.9–10.3)
Chloride: 105 mmol/L (ref 98–111)
Creatinine: 2.61 mg/dL — ABNORMAL HIGH (ref 0.44–1.00)
GFR, Estimated: 19 mL/min — ABNORMAL LOW (ref >60)
Glucose, Bld: 128 mg/dL — ABNORMAL HIGH (ref 70–99)
Potassium: 3.9 mmol/L (ref 3.5–5.1)
Sodium: 139 mmol/L (ref 135–145)
Total Bilirubin: 0.6 mg/dL (ref 0.3–1.2)
Total Protein: 7 g/dL (ref 6.5–8.1)

## 2022-06-06 LAB — IRON AND IRON BINDING CAPACITY (CC-WL,HP ONLY)
Iron: 72 ug/dL (ref 28–170)
Saturation Ratios: 19 % (ref 10.4–31.8)
TIBC: 375 ug/dL (ref 250–450)
UIBC: 303 ug/dL (ref 148–442)

## 2022-06-06 LAB — RETIC PANEL
Immature Retic Fract: 10.5 % (ref 2.3–15.9)
RBC.: 4.91 MIL/uL (ref 3.87–5.11)
Retic Count, Absolute: 111 10*3/uL (ref 19.0–186.0)
Retic Ct Pct: 2.3 % (ref 0.4–3.1)
Reticulocyte Hemoglobin: 30.7 pg (ref >27.9)

## 2022-06-06 LAB — FERRITIN: Ferritin: 49 ng/mL (ref 11–307)

## 2022-06-06 NOTE — Progress Notes (Signed)
[place] [phone]) [phone]   Fax:([phone] [place] NOTE  Patient Care Team: Kathyrn Lass, MD as PCP - General (Family Medicine) Hunsucker, Bonna Gains, MD as PCP - Pulmonology (Pulmonary Disease) [facility] as Registered Nurse [facility] and Palliative Medicine) Gean Quint, MD as Consulting Physician (Nephrology)  Hematological/Oncological History # History of Bladder Cancer  2017: Initial diagnosis of bladder cancer. 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  No other evidence of residual/recurrent disease 06/06/2022: Establish care with Dr. Lorenso Cruz  CHIEF COMPLAINTS/PURPOSE OF CONSULTATION:  "History of Bladder Cancer  "  HISTORY OF PRESENTING ILLNESS:  Shelia Cruz 72 y.o. female with medical history significant for bladder cancer with extensive history noted above who presents for evaluation of lymphadenopathy concerning for recurrent disease.  On review of the previous records the patient initially was diagnosed in 2017, but in 2018 became refractory to intravesicular BCG, valrubicin, and mitomycin.  In 2020 patient underwent nephroureterectomy and underwent a radical cystectomy later in February 2021.  Most recently on 02/18/2022 patient underwent a CT abdomen pelvis without contrast which showed pelvic/inguinal lymphadenopathy.  Due to concern for these findings the patient was referred to oncology for further evaluation and management.  On exam today Mrs. Shelia Cruz that she was initially diagnosed when she was producing blood from her vagina but she was concerned because her husband was "messing around".  That is when the initial  tumor was found.  She reports that she currently has been having some light bleeding from her stoma with pink-colored urine in the bag.  She reports that her abdomen has been painful and sensitive and the etiology of this has not been clear.  She reports that she does occasionally have some trouble walking.  She has not been having any trouble with nausea, vomiting, or diarrhea.  She denies any fevers, chills, sweats.  She has been prescribed iron pills before but is not taking them.  On further discussion she reports that she has been gaining some weight though she feels like she has poor appetite.  She notes that sometimes she forgets to eat and yesterday she did not eat until 8:00 PM.  She reports that she cannot drink plain water because it makes her "vomit".  She prefers to drink tea or other watered-down drinks but cannot drink water alone.  On review of family history the patient reports that her mother had a heart attack and her father had a heart attack as well.  She reports she has a sister who has immune disease.  She and she has 2 daughters 1 of whom who has sarcoidosis.  She notes that she is a never smoker but does drink alcohol on a daily basis.  She reports that she currently works at [employer].  She otherwise denies any fevers, chills, sweats.  A full 10 point ROS is otherwise negative.   MEDICAL HISTORY:  Past Medical History:  Diagnosis Date   AKI (acute kidney injury) ([place]) 09/30/2019   Bladder cancer ([place])    bladder cancer   GERD (gastroesophageal reflux disease)    Hypertension    Pancreatic mass    Renal disorder    Thyroid disease    Ureteral obstruction 09/30/2019   Vulvar pain 06/02/2021    SURGICAL HISTORY:  Past Surgical History:  Procedure Laterality Date   ABDOMINAL HYSTERECTOMY     BREAST BIOPSY Left    benign   CHOLECYSTECTOMY     CYSTOSCOPY     IR EXT NEPHROURETERAL CATH EXCHANGE  09/30/2019   KIDNEY SURGERY     NASAL SEPTUM SURGERY     REPLACEMENT TOTAL  KNEE BILATERAL     RIGHT HEART CATH N/A 05/20/2021   Procedure: RIGHT HEART CATH;  Surgeon: Belva Crome, MD;  Location: [facility];  Service: Cardiovascular;  Laterality: N/A;    SOCIAL HISTORY: Social History   Socioeconomic History   Marital status: Single    Spouse name: Not on file   Number of children: Not on file   Years of education: Not on file   Highest education level: Not on file  Occupational History   Not on file  Tobacco Use   Smoking status: Never   Smokeless tobacco: Never  Vaping Use   Vaping Use: Never used  Substance and Sexual Activity   Alcohol use: No   Drug use: No   Sexual activity: Not on file  Other Topics Concern   Not on file  Social History Narrative   Not on file   Social Determinants of Health   Financial Resource Strain: Not on file  Food Insecurity: Not on file  Transportation Needs: Not on file  Physical Activity: Not on file  Stress: Not on file  Social Connections: Not on file  Intimate Partner Violence: Not on file    FAMILY HISTORY: Family History  Problem Relation Age of Onset   Hypertension Mother    Diabetes Other     ALLERGIES:  is allergic to iodinated contrast media, sulfamethoxazole-trimethoprim, ciprofloxacin, and sulfacetamide sodium.  MEDICATIONS:  Current Outpatient Medications  Medication Sig Dispense Refill   albuterol (VENTOLIN HFA) 108 (90 Base) MCG/ACT inhaler Inhale 2 puffs into the lungs every 6 (six) hours as needed for wheezing or shortness of breath. 8 g 6   amLODipine (NORVASC) 2.5 MG tablet Take 1 tablet (2.5 mg total) by mouth daily. 30 tablet 0   bimatoprost (LUMIGAN) 0.01 % SOLN Place 1 drop into both eyes at bedtime.     fluticasone (FLONASE) 50 MCG/ACT nasal spray Place 1 spray into the nose daily.     furosemide (LASIX) 20 MG tablet TAKE 1 TABLET BY MOUTH DAILY 90 tablet 0   gabapentin (NEURONTIN) 300 MG capsule Take 300 mg by mouth 2 (two) times daily as needed (pain).      levothyroxine (SYNTHROID) 150 MCG tablet Take 150 mcg by mouth daily before breakfast.     ondansetron (ZOFRAN) 4 MG tablet Take 4 mg by mouth every morning.     pantoprazole (PROTONIX) 40 MG tablet Take 1 tablet (40 mg total) by mouth 2 (two) times daily. (Patient taking differently: Take 80 mg by mouth daily.) 60 tablet 0   Probiotic Product (ALIGN) 4 MG CAPS Take 1 capsule by mouth daily.     Propylene Glycol (SYSTANE COMPLETE) 0.6 % SOLN Place 1 drop into both eyes daily as needed (dry eyes).     rOPINIRole (REQUIP) 2 MG tablet Take 1 tablet (2 mg total) by mouth at bedtime as needed (restless legs). (Patient taking differently: Take 2 mg by mouth at bedtime.) 30 tablet 0   sertraline (ZOLOFT) 50 MG tablet Take 50 mg by mouth daily.     traMADol (ULTRAM) 50 MG tablet Take 50 mg by mouth 2 (two) times  daily as needed for severe pain.     No current facility-administered medications for this visit.    REVIEW OF SYSTEMS:   Constitutional: ( - ) fevers, ( - )  chills , ( - ) night sweats Eyes: ( - ) blurriness of vision, ( - ) double vision, ( - ) watery eyes Ears, nose, mouth, throat, and face: ( - ) mucositis, ( - ) sore throat Respiratory: ( - ) cough, ( - ) dyspnea, ( - ) wheezes Cardiovascular: ( - ) palpitation, ( - ) chest discomfort, ( - ) lower extremity swelling Gastrointestinal:  ( - ) nausea, ( - ) heartburn, ( - ) change in bowel habits Skin: ( - ) abnormal skin rashes Lymphatics: ( - ) new lymphadenopathy, ( - ) easy bruising Neurological: ( - ) numbness, ( - ) tingling, ( - ) new weaknesses Behavioral/Psych: ( - ) mood change, ( - ) new changes  All other systems were reviewed with the patient and are negative.  PHYSICAL EXAMINATION:  Vitals:   06/06/22 1318  BP: 120/68  Pulse: 86  Resp: 18  Temp: 97.7 F (36.5 C)  SpO2: 95%   Filed Weights   06/06/22 1318  Weight: 214 lb 8 oz (97.3 kg)    GENERAL: well appearing elderly Caucasian female in NAD  SKIN: skin  color, texture, turgor are normal, no rashes or significant lesions EYES: conjunctiva are pink and non-injected, sclera clear LUNGS: clear to auscultation and percussion with normal breathing effort HEART: regular rate & rhythm and no murmurs and no lower extremity edema Musculoskeletal: no cyanosis of digits and no clubbing  PSYCH: alert & oriented x 3, fluent speech NEURO: no focal motor/sensory deficits  LABORATORY DATA:  I have reviewed the data as listed    Latest Ref Rng & Units 06/06/2022    1:59 PM 05/20/2021   10:36 AM 05/20/2021   10:35 AM  CBC  WBC 4.0 - 10.5 K/uL 13.7     Hemoglobin 12.0 - 15.0 g/dL 14.2  12.6  12.6   Hematocrit 36.0 - 46.0 % 43.3  37.0  37.0   Platelets 150 - 400 K/uL 521          Latest Ref Rng & Units 06/06/2022    1:59 PM 06/11/2021   11:13 AM 05/20/2021   10:36 AM  CMP  Glucose 70 - 99 mg/dL 128  94    BUN 8 - 23 mg/dL 30  53    Creatinine 0.44 - 1.00 mg/dL 2.61  2.81    Sodium 135 - 145 mmol/L 139  141  141   Potassium 3.5 - 5.1 mmol/L 3.9  5.1  4.4   Chloride 98 - 111 mmol/L 105  104    CO2 22 - 32 mmol/L 25  19    Calcium 8.9 - 10.3 mg/dL 9.2  9.8    Total Protein 6.5 - 8.1 g/dL 7.0     Total Bilirubin 0.3 - 1.2 mg/dL 0.6     Alkaline Phos 38 - 126 U/L 135     AST 15 - 41 U/L 13     ALT 0 - 44 U/L 11        RADIOGRAPHIC STUDIES: I have personally reviewed the radiological images as listed and agreed with the findings in the report. No results found.  ASSESSMENT & PLAN Marki Brenchley Voyles 72 y.o. female with medical history significant for bladder cancer with extensive history noted above who presents for evaluation  of lymphadenopathy concerning for recurrent disease.  After review of the labs, review of the records, and discussion with the patient the patients findings are most consistent with enlarged lymph nodes concerning for recurrent bladder cancer.  # History of Bladder Cancer # Enlarged Pelvis Lymph Nodes -- Due to concern  for recurrent disease we will order a PET CT scan in order to evaluate the lymphadenopathy in the pelvis. -- If evidence of FDG avidity would recommend pursuing biopsy in order to confirm recurrence. -- Labs today show white blood cell count 13.7, hemoglobin 14.2, MCV 88.7, and platelets of 521 -- Return to clinic pending the results of the above studies.  Orders Placed This Encounter  Procedures   NM PET Image Initial (PI) Skull Base To Thigh    Standing Status:   Future    Standing Expiration Date:   06/06/2023    Order Specific Question:   If indicated for the ordered procedure, I authorize the administration of a radiopharmaceutical per Radiology protocol    Answer:   Yes    Order Specific Question:   Preferred imaging location?    Answer:   [place]   CBC with Differential (Cancer Center Only)    Standing Status:   Future    Number of Occurrences:   1    Standing Expiration Date:   06/06/2023   CMP ([facility] only)    Standing Status:   Future    Number of Occurrences:   1    Standing Expiration Date:   06/06/2023   Ferritin    Standing Status:   Future    Number of Occurrences:   1    Standing Expiration Date:   06/06/2023   Iron and Iron Binding Capacity ([facility] only)    Standing Status:   Future    Number of Occurrences:   1    Standing Expiration Date:   06/06/2023   Retic Panel    Standing Status:   Future    Number of Occurrences:   1    Standing Expiration Date:   06/06/2023    All questions were answered. The patient knows to call the clinic with any problems, questions or concerns.  A total of more than 60 minutes were spent on this encounter with face-to-face time and non-face-to-face time, including preparing to see the patient, ordering tests and/or medications, counseling the patient and coordination of care as outlined above.   Ledell Peoples, MD Department of Hematology/Oncology [facility] Phone:  [phone] Pager: [phone] Email: [email or web address]  06/12/2022 6:18 PM

## 2022-06-08 DIAGNOSIS — N184 Chronic kidney disease, stage 4 (severe): Secondary | ICD-10-CM | POA: Diagnosis not present

## 2022-06-08 DIAGNOSIS — Z9989 Dependence on other enabling machines and devices: Secondary | ICD-10-CM | POA: Diagnosis not present

## 2022-06-08 DIAGNOSIS — I700 Atherosclerosis of aorta: Secondary | ICD-10-CM | POA: Diagnosis not present

## 2022-06-08 DIAGNOSIS — K2970 Gastritis, unspecified, without bleeding: Secondary | ICD-10-CM | POA: Diagnosis not present

## 2022-06-08 DIAGNOSIS — E039 Hypothyroidism, unspecified: Secondary | ICD-10-CM | POA: Diagnosis not present

## 2022-06-11 ENCOUNTER — Encounter: Payer: Self-pay | Admitting: Hematology and Oncology

## 2022-06-13 ENCOUNTER — Telehealth: Payer: Self-pay | Admitting: Gastroenterology

## 2022-06-13 ENCOUNTER — Other Ambulatory Visit: Payer: Self-pay | Admitting: Hematology and Oncology

## 2022-06-13 ENCOUNTER — Other Ambulatory Visit (HOSPITAL_COMMUNITY): Payer: Self-pay

## 2022-06-13 MED ORDER — LORAZEPAM 0.5 MG PO TABS
0.5000 mg | ORAL_TABLET | Freq: Once | ORAL | 0 refills | Status: DC
  Filled 2022-06-13: qty 1, 1d supply, fill #0

## 2022-06-13 MED ORDER — LORAZEPAM 0.5 MG PO TABS: 0.5000 mg | ORAL_TABLET | Freq: Once | ORAL | 0 refills | Status: AC

## 2022-06-13 NOTE — Telephone Encounter (Signed)
Good afternoon Dr. Loletha Carrow  The following patient is being referred to [place] for chronic gastritis. She has previous GI history with [facility] and no longer wants to continue care because we are closer and she has heard great things about our establishment. She has records available with [facility]. Please review and advise of scheduling.

## 2022-06-13 NOTE — Telephone Encounter (Signed)
Next available new patient appointment with me or APP.  - HD

## 2022-06-14 ENCOUNTER — Other Ambulatory Visit (HOSPITAL_COMMUNITY): Payer: Self-pay | Admitting: Urology

## 2022-06-14 DIAGNOSIS — R319 Hematuria, unspecified: Secondary | ICD-10-CM

## 2022-06-14 DIAGNOSIS — N135 Crossing vessel and stricture of ureter without hydronephrosis: Secondary | ICD-10-CM

## 2022-06-15 ENCOUNTER — Other Ambulatory Visit (HOSPITAL_COMMUNITY): Payer: Self-pay | Admitting: Urology

## 2022-06-15 DIAGNOSIS — Z8551 Personal history of malignant neoplasm of bladder: Secondary | ICD-10-CM

## 2022-06-15 DIAGNOSIS — C652 Malignant neoplasm of left renal pelvis: Secondary | ICD-10-CM

## 2022-06-15 DIAGNOSIS — R310 Gross hematuria: Secondary | ICD-10-CM

## 2022-06-15 DIAGNOSIS — N135 Crossing vessel and stricture of ureter without hydronephrosis: Secondary | ICD-10-CM

## 2022-06-15 NOTE — Telephone Encounter (Signed)
Left VM to call and schedule

## 2022-06-16 ENCOUNTER — Telehealth (HOSPITAL_COMMUNITY): Payer: Self-pay

## 2022-06-16 ENCOUNTER — Other Ambulatory Visit (HOSPITAL_COMMUNITY): Payer: Self-pay | Admitting: Urology

## 2022-06-16 DIAGNOSIS — C652 Malignant neoplasm of left renal pelvis: Secondary | ICD-10-CM

## 2022-06-16 DIAGNOSIS — N135 Crossing vessel and stricture of ureter without hydronephrosis: Secondary | ICD-10-CM

## 2022-06-16 DIAGNOSIS — R310 Gross hematuria: Secondary | ICD-10-CM

## 2022-06-16 DIAGNOSIS — Z8551 Personal history of malignant neoplasm of bladder: Secondary | ICD-10-CM

## 2022-06-16 NOTE — Telephone Encounter (Signed)
Called to schedule neph exchange, no answer, left vm. AB

## 2022-06-21 ENCOUNTER — Other Ambulatory Visit: Payer: Self-pay | Admitting: Cardiovascular Disease

## 2022-06-21 DIAGNOSIS — R0602 Shortness of breath: Secondary | ICD-10-CM

## 2022-06-22 ENCOUNTER — Ambulatory Visit (HOSPITAL_COMMUNITY)
Admission: RE | Admit: 2022-06-22 | Discharge: 2022-06-22 | Disposition: A | Discharge status: Home / Self Care | Payer: Medicare Other | Source: Ambulatory Visit | Attending: Urology | Admitting: Urology

## 2022-06-22 ENCOUNTER — Encounter (HOSPITAL_COMMUNITY): Payer: Self-pay

## 2022-06-22 ENCOUNTER — Other Ambulatory Visit (HOSPITAL_COMMUNITY): Payer: Self-pay | Admitting: Urology

## 2022-06-22 ENCOUNTER — Ambulatory Visit (HOSPITAL_COMMUNITY): Admission: RE | Admit: 2022-06-22 | Payer: Medicare Other | Source: Ambulatory Visit

## 2022-06-22 DIAGNOSIS — Z436 Encounter for attention to other artificial openings of urinary tract: Secondary | ICD-10-CM | POA: Diagnosis not present

## 2022-06-22 DIAGNOSIS — R310 Gross hematuria: Secondary | ICD-10-CM

## 2022-06-22 DIAGNOSIS — Z8551 Personal history of malignant neoplasm of bladder: Secondary | ICD-10-CM | POA: Diagnosis not present

## 2022-06-22 DIAGNOSIS — N135 Crossing vessel and stricture of ureter without hydronephrosis: Secondary | ICD-10-CM | POA: Diagnosis not present

## 2022-06-22 DIAGNOSIS — Z466 Encounter for fitting and adjustment of urinary device: Secondary | ICD-10-CM | POA: Diagnosis not present

## 2022-06-22 DIAGNOSIS — C652 Malignant neoplasm of left renal pelvis: Secondary | ICD-10-CM | POA: Insufficient documentation

## 2022-06-22 HISTORY — PX: IR EXT NEPHROURETERAL CATH EXCHANGE: IMG5418

## 2022-06-22 MED ORDER — IOHEXOL 300 MG/ML  SOLN
50.0000 mL | Freq: Once | INTRAMUSCULAR | Status: AC | PRN
  Administered 2022-06-22: 20 mL

## 2022-06-22 MED ORDER — LIDOCAINE HCL 1 % IJ SOLN
INTRAMUSCULAR | Status: AC
  Filled 2022-06-22: qty 20

## 2022-06-22 NOTE — Procedures (Signed)
Interventional Radiology Procedure Note  Procedure: Image guided exchange of retrograde right nephroureteral drain via ostomy.  4F pigtail drain, 45cm  Nephro-uretero-gram, via sheath.   Complications: None  Recommendations: - Routine drain care with ostomy    Signed,  Dulcy Fanny. Earleen Newport, DO

## 2022-06-24 ENCOUNTER — Ambulatory Visit (HOSPITAL_COMMUNITY)
Admission: RE | Admit: 2022-06-24 | Discharge: 2022-06-24 | Disposition: A | Discharge status: Home / Self Care | Payer: Medicare Other | Source: Ambulatory Visit | Attending: Hematology and Oncology | Admitting: Hematology and Oncology

## 2022-06-24 ENCOUNTER — Ambulatory Visit (HOSPITAL_COMMUNITY): Payer: Medicare Other

## 2022-06-24 DIAGNOSIS — R590 Localized enlarged lymph nodes: Secondary | ICD-10-CM | POA: Diagnosis not present

## 2022-06-24 DIAGNOSIS — C679 Malignant neoplasm of bladder, unspecified: Secondary | ICD-10-CM | POA: Diagnosis not present

## 2022-06-24 DIAGNOSIS — Z8551 Personal history of malignant neoplasm of bladder: Secondary | ICD-10-CM | POA: Insufficient documentation

## 2022-06-24 LAB — GLUCOSE, CAPILLARY: Glucose-Capillary: 133 mg/dL — ABNORMAL HIGH (ref 70–99)

## 2022-06-24 MED ORDER — FLUDEOXYGLUCOSE F - 18 (FDG) INJECTION
11.2000 | Freq: Once | INTRAVENOUS | Status: AC | PRN
  Administered 2022-06-24: 10.6 via INTRAVENOUS

## 2022-06-27 ENCOUNTER — Telehealth: Payer: Self-pay | Admitting: Hematology and Oncology

## 2022-06-27 ENCOUNTER — Other Ambulatory Visit: Payer: Self-pay | Admitting: Hematology and Oncology

## 2022-06-27 ENCOUNTER — Encounter: Payer: Self-pay | Admitting: Hematology and Oncology

## 2022-06-27 DIAGNOSIS — Z8551 Personal history of malignant neoplasm of bladder: Secondary | ICD-10-CM

## 2022-06-27 MED ORDER — OXYCODONE HCL 5 MG PO TABS: 5.0000 mg | ORAL_TABLET | Freq: Four times a day (QID) | ORAL | 0 refills | Status: DC | PRN

## 2022-06-27 NOTE — Telephone Encounter (Signed)
Called Mrs. Apo to discuss the results of her PET CT scan.  At this time findings are concerning for cancerous involvement of the lymph nodes.  This may represent metastatic spread of the bladder cancer versus new primary such as lymphoma.  In order to confirm the diagnosis we will order an IR guided biopsy.  Once the biopsy has returned we will discuss treatment options moving forward.  Additionally patient is having quite a bit of pain not well-controlled by her tramadol.  She requested something stronger.  We called in oxycodone 5 mg every 6 hours as needed and discontinue tramadol.  Noted would be okay to take this medication in conjunction with Tylenol.  She voiced understanding of the plan moving forward.  Ulysees Barns, MD Department of Hematology/Oncology [facility] at [facility] Phone: [phone] Pager: [phone] Email: [email or web address]

## 2022-06-28 ENCOUNTER — Other Ambulatory Visit: Payer: Self-pay | Admitting: Hematology and Oncology

## 2022-06-28 ENCOUNTER — Telehealth: Payer: Self-pay | Admitting: *Deleted

## 2022-06-28 ENCOUNTER — Encounter: Payer: Self-pay | Admitting: General Practice

## 2022-06-28 MED ORDER — OXYCODONE HCL 5 MG PO TABS: 5.0000 mg | ORAL_TABLET | Freq: Four times a day (QID) | ORAL | 0 refills | Status: AC | PRN

## 2022-06-28 NOTE — Telephone Encounter (Signed)
[facility] states they cannot get approval for a 15 day supply of Oxycodone as this is her first prescription. They need a 7 day supply to start.

## 2022-06-28 NOTE — Progress Notes (Signed)
Gilmer Mor, DO  Jisela Merlino, Katheren Shams, [place]; P Ir Procedure Requests OK for US guided biopsy of right inguinal lymph nodes.    FDG avid on PET. Suspect urothelial CA recurrence based on hematuria, recent drain exchange/image, and PET.  [address]       Previous Messages    ----- Message ----- From: Caroleen Hamman, NT Sent: 06/28/2022   9:33 AM EDT To: Ir Procedure Requests Subject: CT Biopsy                                      Procedure: CT Biopsy  Reason: PET Avid lymph nodes concerning for recurrent/metastatic bladder cancer. Requesting biopsy Dx: History of bladder cancer  History: PET and CT in chart  Provider: Jaci Standard, MD   Contact: [phone]

## 2022-07-01 ENCOUNTER — Encounter: Payer: Self-pay | Admitting: Hematology and Oncology

## 2022-07-12 ENCOUNTER — Other Ambulatory Visit: Payer: Self-pay | Admitting: Student

## 2022-07-12 ENCOUNTER — Other Ambulatory Visit: Payer: Self-pay | Admitting: Radiology

## 2022-07-12 ENCOUNTER — Telehealth: Payer: Self-pay

## 2022-07-12 DIAGNOSIS — R591 Generalized enlarged lymph nodes: Secondary | ICD-10-CM

## 2022-07-12 NOTE — Telephone Encounter (Signed)
1634 Palliative Care Note  Attempted to contact pt for palliative care check in. No answer. LVM with contact info and request for return call.   This is attempt #1  Barbette Merino, RN

## 2022-07-13 ENCOUNTER — Ambulatory Visit (HOSPITAL_COMMUNITY)
Admission: RE | Admit: 2022-07-13 | Discharge: 2022-07-13 | Disposition: A | Discharge status: Home / Self Care | Payer: Medicare Other | Source: Ambulatory Visit | Attending: Hematology and Oncology | Admitting: Hematology and Oncology

## 2022-07-13 ENCOUNTER — Telehealth (HOSPITAL_COMMUNITY): Payer: Self-pay

## 2022-07-13 ENCOUNTER — Encounter: Payer: Self-pay | Admitting: Hematology and Oncology

## 2022-07-13 ENCOUNTER — Encounter (HOSPITAL_COMMUNITY): Payer: Self-pay

## 2022-07-13 DIAGNOSIS — R590 Localized enlarged lymph nodes: Secondary | ICD-10-CM | POA: Insufficient documentation

## 2022-07-13 DIAGNOSIS — C801 Malignant (primary) neoplasm, unspecified: Secondary | ICD-10-CM | POA: Diagnosis not present

## 2022-07-13 DIAGNOSIS — R591 Generalized enlarged lymph nodes: Secondary | ICD-10-CM

## 2022-07-13 DIAGNOSIS — C774 Secondary and unspecified malignant neoplasm of inguinal and lower limb lymph nodes: Secondary | ICD-10-CM | POA: Insufficient documentation

## 2022-07-13 DIAGNOSIS — C679 Malignant neoplasm of bladder, unspecified: Secondary | ICD-10-CM | POA: Insufficient documentation

## 2022-07-13 DIAGNOSIS — Z8551 Personal history of malignant neoplasm of bladder: Secondary | ICD-10-CM

## 2022-07-13 LAB — CBC
HCT: 44.4 % (ref 36.0–46.0)
Hemoglobin: 13.4 g/dL (ref 12.0–15.0)
MCH: 27.7 pg (ref 26.0–34.0)
MCHC: 30.2 g/dL (ref 30.0–36.0)
MCV: 91.9 fL (ref 80.0–100.0)
Platelets: 515 10*3/uL — ABNORMAL HIGH (ref 150–400)
RBC: 4.83 MIL/uL (ref 3.87–5.11)
RDW: 14.4 % (ref 11.5–15.5)
WBC: 12.2 10*3/uL — ABNORMAL HIGH (ref 4.0–10.5)
nRBC: 0 % (ref 0.0–0.2)

## 2022-07-13 LAB — PROTIME-INR
INR: 1.1 (ref 0.8–1.2)
Prothrombin Time: 13.8 seconds (ref 11.4–15.2)

## 2022-07-13 MED ORDER — MIDAZOLAM HCL 2 MG/2ML IJ SOLN
INTRAMUSCULAR | Status: AC
  Filled 2022-07-13: qty 4

## 2022-07-13 MED ORDER — LIDOCAINE HCL (PF) 1 % IJ SOLN
5.0000 mL | Freq: Once | INTRAMUSCULAR | Status: AC
  Administered 2022-07-13: 5 mL via INTRADERMAL

## 2022-07-13 MED ORDER — SODIUM CHLORIDE 0.9 % IV SOLN: INTRAVENOUS | Status: DC

## 2022-07-13 MED ORDER — FENTANYL CITRATE (PF) 100 MCG/2ML IJ SOLN
INTRAMUSCULAR | Status: AC | PRN
  Administered 2022-07-13 (×2): 25 ug via INTRAVENOUS
  Administered 2022-07-13: 50 ug via INTRAVENOUS

## 2022-07-13 MED ORDER — FENTANYL CITRATE (PF) 100 MCG/2ML IJ SOLN
INTRAMUSCULAR | Status: AC
  Filled 2022-07-13: qty 4

## 2022-07-13 MED ORDER — MIDAZOLAM HCL 2 MG/2ML IJ SOLN
INTRAMUSCULAR | Status: AC | PRN
  Administered 2022-07-13: 1 mg via INTRAVENOUS
  Administered 2022-07-13: .5 mg via INTRAVENOUS

## 2022-07-13 NOTE — H&P (Signed)
Chief Complaint: Patient was seen in consultation today for right inguinal lymphadenopathy  Referring Physician(s): Dorsey,John T IV  Supervising Physician: Gilmer Mor  Patient Status: [facility] - Out-pt  History of Present Illness: Shelia Cruz is a 72 y.o. female known to IR from recent exchange of retrograde right nephroureteral drain via ostomy 06/22/22.  She recently underwent PET with identification of avid lymph nodes concerning for recurrent/metastatic disease.  She is referred to IR for lymph node biopsy at the request of Dr. Leonides Schanz.   Patient presents to [facility] in her usual state of health today.  She has been NPO.  She desires sedation for her procedure today.  Her daughter is available for post-procedure care and transportation home today. She is aware of goals of the procedure today and is agreeable to proceed.   Past Medical History:  Diagnosis Date   AKI (acute kidney injury) 09/30/2019   Bladder cancer    bladder cancer   GERD (gastroesophageal reflux disease)    Hypertension    Pancreatic mass    Renal disorder    Thyroid disease    Ureteral obstruction 09/30/2019   Vulvar pain 06/02/2021    Past Surgical History:  Procedure Laterality Date   ABDOMINAL HYSTERECTOMY     BREAST BIOPSY Left    benign   CHOLECYSTECTOMY     CYSTOSCOPY     IR EXT NEPHROURETERAL CATH EXCHANGE  09/30/2019   IR EXT NEPHROURETERAL CATH EXCHANGE  06/22/2022   KIDNEY SURGERY     NASAL SEPTUM SURGERY     REPLACEMENT TOTAL KNEE BILATERAL     RIGHT HEART CATH N/A 05/20/2021   Procedure: RIGHT HEART CATH;  Surgeon: Lyn Records, MD;  Location: [facility] INVASIVE CV LAB;  Service: Cardiovascular;  Laterality: N/A;    Allergies: Iodinated contrast media, Sulfamethoxazole-trimethoprim, Ciprofloxacin, and Sulfacetamide sodium  Medications: Prior to Admission medications   Medication Sig Start Date End Date Taking? Authorizing Provider  amLODipine (NORVASC) 2.5 MG tablet Take 1  tablet (2.5 mg total) by mouth daily. 10/05/19  Yes Regalado, Belkys A, MD  bimatoprost (LUMIGAN) 0.01 % SOLN Place 1 drop into both eyes at bedtime. 04/15/19  Yes [provider]  furosemide (LASIX) 20 MG tablet TAKE 1 TABLET BY MOUTH DAILY 06/22/22  Yes Tonny Bollman, MD  gabapentin (NEURONTIN) 300 MG capsule Take 300 mg by mouth 2 (two) times daily as needed (pain). 12/22/20  Yes [provider]  levothyroxine (SYNTHROID) 150 MCG tablet Take 150 mcg by mouth daily before breakfast. 11/18/20  Yes [provider]  ondansetron (ZOFRAN) 4 MG tablet Take 4 mg by mouth every morning.   Yes [provider]  pantoprazole (PROTONIX) 40 MG tablet Take 1 tablet (40 mg total) by mouth 2 (two) times daily. Patient taking differently: Take 80 mg by mouth daily. 10/04/19  Yes Regalado, Belkys A, MD  Probiotic Product (ALIGN) 4 MG CAPS Take 1 capsule by mouth daily.   Yes [provider]  rOPINIRole (REQUIP) 2 MG tablet Take 1 tablet (2 mg total) by mouth at bedtime as needed (restless legs). Patient taking differently: Take 2 mg by mouth at bedtime. 10/04/19  Yes Regalado, Belkys A, MD  sertraline (ZOLOFT) 50 MG tablet Take 50 mg by mouth daily. 02/16/21  Yes [provider]  albuterol (VENTOLIN HFA) 108 (90 Base) MCG/ACT inhaler Inhale 2 puffs into the lungs every 6 (six) hours as needed for wheezing or shortness of breath. 12/16/21   Hunsucker, Lesia Sago,  MD  fluticasone (FLONASE) 50 MCG/ACT nasal spray Place 1 spray into the nose daily.    [provider]  Propylene Glycol (SYSTANE COMPLETE) 0.6 % SOLN Place 1 drop into both eyes daily as needed (dry eyes).    [provider]     Family History  Problem Relation Age of Onset   Hypertension Mother    Diabetes Other     Social History   Socioeconomic History   Marital status: Single    Spouse name: Not on file   Number of children: Not on file   Years of education: Not on file    Highest education level: Not on file  Occupational History   Not on file  Tobacco Use   Smoking status: Never   Smokeless tobacco: Never  Vaping Use   Vaping Use: Never used  Substance and Sexual Activity   Alcohol use: No   Drug use: No   Sexual activity: Not on file  Other Topics Concern   Not on file  Social History Narrative   Not on file   Social Determinants of Health   Financial Resource Strain: Not on file  Food Insecurity: Not on file  Transportation Needs: Not on file  Physical Activity: Not on file  Stress: Not on file  Social Connections: Not on file     Review of Systems: A 12 point ROS discussed and pertinent positives are indicated in the HPI above.  All other systems are negative.  Review of Systems  Constitutional:  Negative for fatigue and fever.  Respiratory:  Negative for cough and shortness of breath.   Cardiovascular:  Negative for chest pain.  Gastrointestinal:  Negative for abdominal pain, nausea and vomiting.  Musculoskeletal:  Negative for back pain.  Psychiatric/Behavioral:  Negative for behavioral problems.     Vital Signs: BP 136/80   Pulse 76   Temp 98.4 F (36.9 C) (Oral)   Resp 18   Ht  (1.626 m)   Wt 200 lb (90.7 kg)   SpO2 93%   BMI 34.33 kg/m   Physical Exam Vitals and nursing note reviewed.  Constitutional:      General: She is not in acute distress.    Appearance: Normal appearance. She is not ill-appearing.  HENT:     Mouth/Throat:     Mouth: Mucous membranes are moist.     Pharynx: Oropharynx is clear.  Cardiovascular:     Rate and Rhythm: Normal rate and regular rhythm.  Pulmonary:     Effort: Pulmonary effort is normal.     Breath sounds: Normal breath sounds.  Abdominal:     General: Abdomen is flat.     Palpations: Abdomen is soft.  Skin:    General: Skin is warm and dry.     Comments: Palpable right, tender inguinal lymph nodes.   Neurological:     General: No focal deficit present.     Mental  Status: She is alert and oriented to person, place, and time. Mental status is at baseline.  Psychiatric:        Mood and Affect: Mood normal.        Behavior: Behavior normal.        Thought Content: Thought content normal.        Judgment: Judgment normal.      MD Evaluation Airway: WNL Heart: WNL Abdomen: WNL Chest/ Lungs: WNL ASA  Classification: 3 Mallampati/Airway Score: Two   Imaging: NM PET Image Initial (PI) Skull Base  To Thigh  Result Date: 06/27/2022 CLINICAL DATA:  Subsequent treatment strategy for bladder cancer. EXAM: NUCLEAR MEDICINE PET SKULL BASE TO THIGH TECHNIQUE: 10.6 mCi F-18 FDG was injected intravenously. Full-ring PET imaging was performed from the skull base to thigh after the radiotracer. CT data was obtained and used for attenuation correction and anatomic localization. Fasting blood glucose: 133 mg/dl COMPARISON:  Abdomen/pelvis CT 02/18/2022.  Chest CT 04/22/2021. FINDINGS: Mediastinal blood pool activity: SUV max 3.2 Liver activity: SUV max NA NECK: No hypermetabolic lymph nodes in the neck. 1.5 cm soft tissue nodule the angle of the mandible, along the inferior margin of the parotid gland is stable since neck CT of 05/07/2020 and shows no hypermetabolism. Imaging features most consistent with benign etiology. Incidental CT findings: None. CHEST: Focal hypermetabolism is identified in the inferior mediastinum. This appears to correspond to a 10 mm short axis lymph node between the esophagus and descending aorta (84/4) with SUV max = 9.3. No other hypermetabolic mediastinal or hilar lymphadenopathy. No hypermetabolic axillary lymphadenopathy. Scattered tiny pulmonary nodules evident, as described on previous chest CT of 04/22/2021. These are below size threshold for resolution on PET imaging but appear stable suggesting benign etiology. Incidental CT findings: There is mild atherosclerotic calcification of the abdominal aorta without aneurysm. ABDOMEN/PELVIS: Mottled  FDG uptake is seen in the liver parenchyma without a definite hypermetabolic nodule or mass. Hypermetabolic retroperitoneal lymph nodes are new since the CT from 02/18/2022. 12 mm short axis left para-aortic lymph node on 01/20 7/4 demonstrates SUV max = 11.5. 8 mm short axis aortocaval node on 01/30 5/4 shows SUV max = 7.8. 10 mm short axis mesenteric node on 145/4 is hypermetabolic with SUV max = 10.0. Hypermetabolic lymph nodes are seen at the aortic bifurcation and right common iliac chain. Index node along the right external iliac artery measures 17 mm short axis on 160/4 with SUV max = 13.1. Hypermetabolic lymph nodes are identified in both groin regions with SUV max = 10.5 Incidental CT findings: No intraperitoneal free fluid. Cholecystectomy. Patient is status post left nephrectomy and cystectomy. Ileal conduit noted with internal external right ureteral stent. No right hydroureteronephrosis. Left colonic diverticulosis. SKELETON: Mottled uptake in the skeleton raises concern for metastatic disease. Stimulatory effects of chemotherapy would also be a consideration. Incidental CT findings: None. IMPRESSION: 1. Status post left nephrectomy and cystectomy with ileal conduit and right ureteral stent in place. No right hydroureteronephrosis. 2. Interval development of hypermetabolic lymphadenopathy in the abdomen and pelvis, consistent with metastatic disease. 3. Hypermetabolic lymph node in the inferior mediastinum, consistent with metastatic disease. 4. Mottled FDG accumulation in the skeleton raises concern for bony metastatic disease. Stimulatory effects of chemotherapy also be a consideration. 5. Stable tiny bilateral pulmonary nodules, below size threshold for resolution on PET imaging. 6.  Aortic Atherosclerosis (ICD10-I70.0). Electronically Signed   By: Kennith Center M.D.   On: 06/27/2022 08:21   IR EXT NEPHROURETERAL CATH EXCHANGE  Result Date: 06/22/2022 INDICATION: 72 year old female referred for  exchange of retrograde nephroureteral drain via ostomy EXAM: IR EXT NEPHROURETERAL CATH EXCHANGE COMPARISON:  CT 02/18/2022 MEDICATIONS: None ANESTHESIA/SEDATION: None CONTRAST:  20mL OMNIPAQUE IOHEXOL 300 MG/ML SOLN - administered into the collecting system(s) FLUOROSCOPY TIME:  Fluoroscopy Time: 1 minutes 6 seconds (22 mGy). COMPLICATIONS: None PROCEDURE: Informed written consent was obtained from the patient after a thorough discussion of the procedural risks, benefits and alternatives. All questions were addressed. Maximal Sterile Barrier Technique was utilized including caps, mask, sterile gowns, sterile gloves, sterile  drape, hand hygiene and skin antiseptic. A timeout was performed prior to the initiation of the procedure. Patient positioned supine under the image intensifier. The abdomen and the indwelling drain were prepped and draped in the usual sterile fashion. Contrast was injected retrograde through the nephroureteral drain opacifying the collecting system. The drain catheter was amputated and 035 Bentson wire was passed into the collecting system. The drain was removed. A 35 cm 5 French Brite tip sheath was advanced to the collecting system. The introducer was removed. Contrast was injected as a pull-back ureteral nephrogram was performed. A 12 French 45 cm drain was then placed on the wire. Pigtail was formed in the collecting system and contrast was evacuated. Some mild hematuria was present throughout the exam, which she says has been baseline for her for some time. No complications. FINDINGS: Irregular filling defect within a posterior upper pole calyx and infundibulum with frondlike margins. Irregular filling defect along the length of the ureter from the ureteropelvic junction to the distal ureter. IMPRESSION: Status post exchange of a right retrograde right-sided nephroureteral drain via and ostomy, with contrast injection performed. The nephrogram and ureteral-gram demonstrate irregular  filling defect within a superior posterior calyx/infundibulum as well as along the length of the ureter. Differential includes progression/recurrence of malignancy as well as chronic irritation/scarring. Signed, Yvone Neu. Miachel Roux, RPVI [facility] Electronically Signed   By: Gilmer Mor D.O.   On: 06/22/2022 16:17    Labs:  CBC: Recent Labs    06/06/22 1359 07/13/22 1147  WBC 13.7* 12.2*  HGB 14.2 13.4  HCT 43.3 44.4  PLT 521* 515*    COAGS: Recent Labs    07/13/22 1147  INR 1.1    BMP: Recent Labs    06/06/22 1359  NA 139  K 3.9  CL 105  CO2 25  GLUCOSE 128*  BUN 30*  CALCIUM 9.2  CREATININE 2.61*  GFRNONAA 19*    LIVER FUNCTION TESTS: Recent Labs    06/06/22 1359  BILITOT 0.6  AST 13*  ALT 11  ALKPHOS 135*  PROT 7.0  ALBUMIN 4.0    TUMOR MARKERS: No results for input(s): "AFPTM", "CEA", "CA199", "CHROMGRNA" in the last 8760 hours.  Assessment and Plan: Patient with past medical history of bladder/urotheial cancer presents with complaint of positive PET, concern for recurrent/metastatic disease.  IR consulted for lymph node biopsy at the request of Dr. Leonides Schanz. Case reviewed by Dr. Loreta Ave who approves patient for procedure.  Patient presents today in their usual state of health.  She has been NPO and is not currently on blood thinners.   Risks and benefits was discussed with the patient and/or patient's family including, but not limited to bleeding, infection, damage to adjacent structures or low yield requiring additional tests.  All of the questions were answered and there is agreement to proceed.  Consent signed and in chart.   Thank you for this interesting consult.  I greatly enjoyed meeting Maddi Collar and look forward to participating in their care.  A copy of this report was sent to the requesting provider on this date.  Electronically Signed: Hoyt Koch, PA 07/13/2022, 12:50 PM   I spent a total of  30 Minutes   in face to face in clinical consultation, greater than 50% of which was counseling/coordinating care for lymphadenopathy.

## 2022-07-13 NOTE — Procedures (Signed)
Pre Procedure Dx: Bladder cancer Post Procedural Dx: Same  Technically successful US guided biopsy of hypermetabolic right inguinal lymph node.  EBL: None No immediate complications.   Katherina Right, MD Pager #: [phone]

## 2022-07-13 NOTE — Telephone Encounter (Signed)
Called to schedule neph exchange. Pt laughed and said that she has bladder cancer and has other things on her mind right now. She will call back when she is ready to schedule. AB

## 2022-07-14 LAB — SURGICAL PATHOLOGY

## 2022-07-18 ENCOUNTER — Telehealth: Payer: Self-pay | Admitting: *Deleted

## 2022-07-18 NOTE — Telephone Encounter (Signed)
Received call from pt's daughter about her mother's biopsy results from 07/13/22. Asked daughter if her mother was aware that she called and if she was able to understand the results herself.  Shelia Cruz stated she was very capable. Advised that I would have Georga Kaufmann, PA call her mother about results in advance of her appt on 07/26/22 Shelia Cruz voiced understanding.

## 2022-07-20 ENCOUNTER — Telehealth: Payer: Self-pay | Admitting: Physician Assistant

## 2022-07-20 NOTE — Telephone Encounter (Signed)
I called patient to review the pathology report from the right inguinal lymph node biopsy from 07/13/2022. Findings are consistent with metastatic carcinoma from her urothelial carcinoma of the bladder. I discussed that Dr. Leonides Schanz recommended systemic therapy with single agent pembrolizumab. I will review the treatment in more detail at our upcoming visit on 07/26/2022.   Patient was appreciative for the call and agreed to further discussion next week.

## 2022-07-22 ENCOUNTER — Encounter: Payer: Self-pay | Admitting: Physician Assistant

## 2022-07-25 ENCOUNTER — Other Ambulatory Visit: Payer: Self-pay | Admitting: Physician Assistant

## 2022-07-25 DIAGNOSIS — Z8551 Personal history of malignant neoplasm of bladder: Secondary | ICD-10-CM

## 2022-07-26 ENCOUNTER — Observation Stay (HOSPITAL_COMMUNITY): Payer: Medicare Other

## 2022-07-26 ENCOUNTER — Encounter (HOSPITAL_COMMUNITY): Payer: Self-pay

## 2022-07-26 ENCOUNTER — Other Ambulatory Visit: Payer: Self-pay

## 2022-07-26 ENCOUNTER — Inpatient Hospital Stay: Payer: Medicare Other | Attending: Hematology and Oncology | Admitting: Physician Assistant

## 2022-07-26 ENCOUNTER — Inpatient Hospital Stay: Payer: Medicare Other

## 2022-07-26 ENCOUNTER — Inpatient Hospital Stay (HOSPITAL_COMMUNITY)
Admission: AD | Admit: 2022-07-26 | Discharge: 2022-07-28 | DRG: 683 | Disposition: A | Discharge status: Hospice | Payer: Medicare Other | Source: Ambulatory Visit | Attending: Internal Medicine | Admitting: Internal Medicine

## 2022-07-26 VITALS — BP 140/87 | HR 83 | Temp 97.9°F | Resp 26 | Wt 210.6 lb

## 2022-07-26 DIAGNOSIS — Z882 Allergy status to sulfonamides status: Secondary | ICD-10-CM

## 2022-07-26 DIAGNOSIS — R112 Nausea with vomiting, unspecified: Secondary | ICD-10-CM

## 2022-07-26 DIAGNOSIS — E872 Acidosis, unspecified: Secondary | ICD-10-CM | POA: Diagnosis not present

## 2022-07-26 DIAGNOSIS — Z96653 Presence of artificial knee joint, bilateral: Secondary | ICD-10-CM | POA: Diagnosis present

## 2022-07-26 DIAGNOSIS — D72828 Other elevated white blood cell count: Secondary | ICD-10-CM | POA: Diagnosis present

## 2022-07-26 DIAGNOSIS — Z7189 Other specified counseling: Secondary | ICD-10-CM | POA: Diagnosis not present

## 2022-07-26 DIAGNOSIS — Z91041 Radiographic dye allergy status: Secondary | ICD-10-CM

## 2022-07-26 DIAGNOSIS — C679 Malignant neoplasm of bladder, unspecified: Secondary | ICD-10-CM

## 2022-07-26 DIAGNOSIS — N1832 Chronic kidney disease, stage 3b: Principal | ICD-10-CM | POA: Diagnosis present

## 2022-07-26 DIAGNOSIS — Z79899 Other long term (current) drug therapy: Secondary | ICD-10-CM

## 2022-07-26 DIAGNOSIS — E875 Hyperkalemia: Secondary | ICD-10-CM | POA: Diagnosis present

## 2022-07-26 DIAGNOSIS — N1330 Unspecified hydronephrosis: Secondary | ICD-10-CM | POA: Diagnosis not present

## 2022-07-26 DIAGNOSIS — Z79890 Hormone replacement therapy (postmenopausal): Secondary | ICD-10-CM

## 2022-07-26 DIAGNOSIS — R109 Unspecified abdominal pain: Secondary | ICD-10-CM | POA: Diagnosis not present

## 2022-07-26 DIAGNOSIS — N184 Chronic kidney disease, stage 4 (severe): Secondary | ICD-10-CM | POA: Diagnosis present

## 2022-07-26 DIAGNOSIS — R1031 Right lower quadrant pain: Secondary | ICD-10-CM

## 2022-07-26 DIAGNOSIS — N179 Acute kidney failure, unspecified: Secondary | ICD-10-CM | POA: Diagnosis not present

## 2022-07-26 DIAGNOSIS — Z8551 Personal history of malignant neoplasm of bladder: Secondary | ICD-10-CM

## 2022-07-26 DIAGNOSIS — K219 Gastro-esophageal reflux disease without esophagitis: Secondary | ICD-10-CM | POA: Diagnosis present

## 2022-07-26 DIAGNOSIS — Z8249 Family history of ischemic heart disease and other diseases of the circulatory system: Secondary | ICD-10-CM | POA: Diagnosis not present

## 2022-07-26 DIAGNOSIS — C7989 Secondary malignant neoplasm of other specified sites: Secondary | ICD-10-CM | POA: Diagnosis present

## 2022-07-26 DIAGNOSIS — C772 Secondary and unspecified malignant neoplasm of intra-abdominal lymph nodes: Secondary | ICD-10-CM | POA: Diagnosis not present

## 2022-07-26 DIAGNOSIS — Z881 Allergy status to other antibiotic agents status: Secondary | ICD-10-CM | POA: Diagnosis not present

## 2022-07-26 DIAGNOSIS — Z90710 Acquired absence of both cervix and uterus: Secondary | ICD-10-CM

## 2022-07-26 DIAGNOSIS — D631 Anemia in chronic kidney disease: Secondary | ICD-10-CM | POA: Diagnosis not present

## 2022-07-26 DIAGNOSIS — Z936 Other artificial openings of urinary tract status: Secondary | ICD-10-CM | POA: Diagnosis not present

## 2022-07-26 DIAGNOSIS — Z66 Do not resuscitate: Secondary | ICD-10-CM | POA: Diagnosis present

## 2022-07-26 DIAGNOSIS — N135 Crossing vessel and stricture of ureter without hydronephrosis: Secondary | ICD-10-CM | POA: Diagnosis present

## 2022-07-26 DIAGNOSIS — E039 Hypothyroidism, unspecified: Secondary | ICD-10-CM | POA: Diagnosis not present

## 2022-07-26 DIAGNOSIS — Z833 Family history of diabetes mellitus: Secondary | ICD-10-CM

## 2022-07-26 DIAGNOSIS — G2581 Restless legs syndrome: Secondary | ICD-10-CM | POA: Diagnosis not present

## 2022-07-26 DIAGNOSIS — I10 Essential (primary) hypertension: Secondary | ICD-10-CM | POA: Diagnosis present

## 2022-07-26 DIAGNOSIS — I129 Hypertensive chronic kidney disease with stage 1 through stage 4 chronic kidney disease, or unspecified chronic kidney disease: Secondary | ICD-10-CM | POA: Diagnosis present

## 2022-07-26 DIAGNOSIS — R4189 Other symptoms and signs involving cognitive functions and awareness: Secondary | ICD-10-CM

## 2022-07-26 DIAGNOSIS — N131 Hydronephrosis with ureteral stricture, not elsewhere classified: Secondary | ICD-10-CM | POA: Diagnosis present

## 2022-07-26 DIAGNOSIS — K5730 Diverticulosis of large intestine without perforation or abscess without bleeding: Secondary | ICD-10-CM | POA: Diagnosis not present

## 2022-07-26 DIAGNOSIS — G8929 Other chronic pain: Secondary | ICD-10-CM | POA: Diagnosis present

## 2022-07-26 LAB — CBC
HCT: 38.2 % (ref 36.0–46.0)
Hemoglobin: 11.9 g/dL — ABNORMAL LOW (ref 12.0–15.0)
MCH: 28.1 pg (ref 26.0–34.0)
MCHC: 31.2 g/dL (ref 30.0–36.0)
MCV: 90.1 fL (ref 80.0–100.0)
Platelets: 521 10*3/uL — ABNORMAL HIGH (ref 150–400)
RBC: 4.24 MIL/uL (ref 3.87–5.11)
RDW: 14.1 % (ref 11.5–15.5)
WBC: 13.7 10*3/uL — ABNORMAL HIGH (ref 4.0–10.5)
nRBC: 0 % (ref 0.0–0.2)

## 2022-07-26 LAB — CBC WITH DIFFERENTIAL (CANCER CENTER ONLY)
Abs Immature Granulocytes: 0.29 10*3/uL — ABNORMAL HIGH (ref 0.00–0.07)
Basophils Absolute: 0.1 10*3/uL (ref 0.0–0.1)
Basophils Relative: 1 %
Eosinophils Absolute: 0.3 10*3/uL (ref 0.0–0.5)
Eosinophils Relative: 2 %
HCT: 39.8 % (ref 36.0–46.0)
Hemoglobin: 12.7 g/dL (ref 12.0–15.0)
Immature Granulocytes: 2 %
Lymphocytes Relative: 5 %
Lymphs Abs: 0.8 10*3/uL (ref 0.7–4.0)
MCH: 28 pg (ref 26.0–34.0)
MCHC: 31.9 g/dL (ref 30.0–36.0)
MCV: 87.7 fL (ref 80.0–100.0)
Monocytes Absolute: 0.8 10*3/uL (ref 0.1–1.0)
Monocytes Relative: 5 %
Neutro Abs: 12.9 10*3/uL — ABNORMAL HIGH (ref 1.7–7.7)
Neutrophils Relative %: 85 %
Platelet Count: 604 10*3/uL — ABNORMAL HIGH (ref 150–400)
RBC: 4.54 MIL/uL (ref 3.87–5.11)
RDW: 14.2 % (ref 11.5–15.5)
WBC Count: 15.2 10*3/uL — ABNORMAL HIGH (ref 4.0–10.5)
nRBC: 0 % (ref 0.0–0.2)

## 2022-07-26 LAB — CMP (CANCER CENTER ONLY)
ALT: 19 U/L (ref 0–44)
AST: 14 U/L — ABNORMAL LOW (ref 15–41)
Albumin: 3.5 g/dL (ref 3.5–5.0)
Alkaline Phosphatase: 261 U/L — ABNORMAL HIGH (ref 38–126)
Anion gap: 15 (ref 5–15)
BUN: 72 mg/dL — ABNORMAL HIGH (ref 8–23)
CO2: 19 mmol/L — ABNORMAL LOW (ref 22–32)
Calcium: 8.4 mg/dL — ABNORMAL LOW (ref 8.9–10.3)
Chloride: 102 mmol/L (ref 98–111)
Creatinine: 6.96 mg/dL — ABNORMAL HIGH (ref 0.44–1.00)
GFR, Estimated: 6 mL/min — ABNORMAL LOW (ref >60)
Glucose, Bld: 117 mg/dL — ABNORMAL HIGH (ref 70–99)
Potassium: 5.5 mmol/L — ABNORMAL HIGH (ref 3.5–5.1)
Sodium: 136 mmol/L (ref 135–145)
Total Bilirubin: 0.3 mg/dL (ref 0.3–1.2)
Total Protein: 6.4 g/dL — ABNORMAL LOW (ref 6.5–8.1)

## 2022-07-26 LAB — TSH: TSH: 2.396 u[IU]/mL (ref 0.350–4.500)

## 2022-07-26 LAB — CALCIUM: Calcium: 8.3 mg/dL — ABNORMAL LOW (ref 8.9–10.3)

## 2022-07-26 LAB — MAGNESIUM: Magnesium: 2 mg/dL (ref 1.7–2.4)

## 2022-07-26 LAB — T4, FREE: Free T4: 1.19 ng/dL — ABNORMAL HIGH (ref 0.61–1.12)

## 2022-07-26 LAB — PHOSPHORUS: Phosphorus: 6.4 mg/dL — ABNORMAL HIGH (ref 2.5–4.6)

## 2022-07-26 LAB — CREATININE, SERUM
Creatinine, Ser: 7.29 mg/dL — ABNORMAL HIGH (ref 0.44–1.00)
GFR, Estimated: 6 mL/min — ABNORMAL LOW (ref >60)

## 2022-07-26 MED ORDER — ROPINIROLE HCL 1 MG PO TABS
2.0000 mg | ORAL_TABLET | Freq: Every day | ORAL | Status: DC
  Administered 2022-07-26 – 2022-07-27 (×2): 2 mg via ORAL
  Filled 2022-07-26 (×2): qty 2

## 2022-07-26 MED ORDER — ONDANSETRON HCL 4 MG PO TABS: 4.0000 mg | ORAL_TABLET | Freq: Four times a day (QID) | ORAL | Status: DC | PRN

## 2022-07-26 MED ORDER — SODIUM ZIRCONIUM CYCLOSILICATE 10 G PO PACK
10.0000 g | PACK | Freq: Two times a day (BID) | ORAL | Status: DC
  Administered 2022-07-26 – 2022-07-28 (×4): 10 g via ORAL
  Filled 2022-07-26 (×4): qty 1

## 2022-07-26 MED ORDER — GABAPENTIN 300 MG PO CAPS: 300.0000 mg | ORAL_CAPSULE | Freq: Two times a day (BID) | ORAL | Status: DC | PRN

## 2022-07-26 MED ORDER — PROPYLENE GLYCOL 0.6 % OP SOLN: 1.0000 [drp] | Freq: Every day | OPHTHALMIC | Status: DC | PRN

## 2022-07-26 MED ORDER — LORAZEPAM 0.5 MG PO TABS
0.5000 mg | ORAL_TABLET | Freq: Four times a day (QID) | ORAL | Status: DC | PRN
  Administered 2022-07-26 – 2022-07-27 (×2): 0.5 mg via ORAL
  Filled 2022-07-26 (×2): qty 1

## 2022-07-26 MED ORDER — PANTOPRAZOLE SODIUM 40 MG PO TBEC
80.0000 mg | DELAYED_RELEASE_TABLET | Freq: Every day | ORAL | Status: DC
  Administered 2022-07-27 – 2022-07-28 (×2): 80 mg via ORAL
  Filled 2022-07-26 (×2): qty 2

## 2022-07-26 MED ORDER — ACETAMINOPHEN 650 MG RE SUPP: 650.0000 mg | Freq: Four times a day (QID) | RECTAL | Status: DC | PRN

## 2022-07-26 MED ORDER — HYDROMORPHONE HCL 1 MG/ML IJ SOLN
1.0000 mg | INTRAMUSCULAR | Status: DC | PRN
  Administered 2022-07-27 – 2022-07-28 (×8): 1 mg via INTRAVENOUS
  Filled 2022-07-26 (×8): qty 1

## 2022-07-26 MED ORDER — LEVOTHYROXINE SODIUM 150 MCG PO TABS
150.0000 ug | ORAL_TABLET | Freq: Every day | ORAL | Status: DC
  Administered 2022-07-27 – 2022-07-28 (×2): 150 ug via ORAL
  Filled 2022-07-26 (×2): qty 1

## 2022-07-26 MED ORDER — SERTRALINE HCL 100 MG PO TABS
100.0000 mg | ORAL_TABLET | Freq: Every day | ORAL | Status: DC
  Administered 2022-07-27 – 2022-07-28 (×2): 100 mg via ORAL
  Filled 2022-07-26 (×2): qty 1

## 2022-07-26 MED ORDER — ONDANSETRON HCL 4 MG/2ML IJ SOLN
4.0000 mg | Freq: Four times a day (QID) | INTRAMUSCULAR | Status: DC | PRN
  Administered 2022-07-27: 4 mg via INTRAVENOUS
  Filled 2022-07-26: qty 2

## 2022-07-26 MED ORDER — BISACODYL 10 MG RE SUPP
10.0000 mg | Freq: Every day | RECTAL | Status: DC | PRN
  Administered 2022-07-28: 10 mg via RECTAL
  Filled 2022-07-26: qty 1

## 2022-07-26 MED ORDER — POLYETHYLENE GLYCOL 3350 17 G PO PACK
17.0000 g | PACK | Freq: Every day | ORAL | Status: DC | PRN
  Administered 2022-07-27: 17 g via ORAL
  Filled 2022-07-26: qty 1

## 2022-07-26 MED ORDER — AMLODIPINE BESYLATE 5 MG PO TABS
2.5000 mg | ORAL_TABLET | Freq: Every day | ORAL | Status: DC
  Administered 2022-07-27 – 2022-07-28 (×2): 2.5 mg via ORAL
  Filled 2022-07-26 (×2): qty 1

## 2022-07-26 MED ORDER — HYDROCODONE-ACETAMINOPHEN 5-325 MG PO TABS
1.0000 | ORAL_TABLET | ORAL | Status: DC | PRN
  Administered 2022-07-26: 1 via ORAL
  Filled 2022-07-26 (×2): qty 2

## 2022-07-26 MED ORDER — ACETAMINOPHEN 325 MG PO TABS: 650.0000 mg | ORAL_TABLET | Freq: Four times a day (QID) | ORAL | Status: DC | PRN

## 2022-07-26 MED ORDER — SODIUM CHLORIDE 0.9 % IV SOLN: INTRAVENOUS | Status: DC

## 2022-07-26 MED ORDER — ONDANSETRON 8 MG PO TBDP: 8.0000 mg | ORAL_TABLET | Freq: Three times a day (TID) | ORAL | 0 refills | Status: AC | PRN

## 2022-07-26 MED ORDER — HEPARIN SODIUM (PORCINE) 5000 UNIT/ML IJ SOLN
5000.0000 [IU] | Freq: Three times a day (TID) | INTRAMUSCULAR | Status: DC
  Administered 2022-07-26 – 2022-07-28 (×4): 5000 [IU] via SUBCUTANEOUS
  Filled 2022-07-26 (×4): qty 1

## 2022-07-26 MED ORDER — POLYVINYL ALCOHOL 1.4 % OP SOLN: 1.0000 [drp] | Freq: Every day | OPHTHALMIC | Status: DC | PRN

## 2022-07-26 NOTE — Assessment & Plan Note (Addendum)
The patient's baseline creatinine appears to be in the 2.5 - 2.6 range. At this admission the patient's creatinine was 6.0 with a potassium of 5.5. This is likely due to dysfunction of her nephrostomy tube . She is receiving IV fluids and lokelma. Urology is consulted and CT abdomen and pelvis without contrast has been ordered. She only has one kidney.

## 2022-07-26 NOTE — Assessment & Plan Note (Signed)
Patient recently had nephrostomy tube replaced. Urology following. She may require stenting.

## 2022-07-26 NOTE — Assessment & Plan Note (Signed)
Mild. Due to AKI on CKD IV. The patient is receiving cautious IV fluids. Monitor.

## 2022-07-26 NOTE — Assessment & Plan Note (Signed)
Due to AKI on CKD IV. Start lokelma. Monitor electrolytes and monitor on telemetry.

## 2022-07-26 NOTE — Progress Notes (Signed)
72 year old with past medical history significant for bladder cancer diagnosed 2017,subsequently underwent nephro ureterectomy as well as radical cystectomy February 2021 for recurrent nonresponsive CIS,  and high-grade TA disease, history of ilio conduit , recently with enlarged pelvis lymph nodes and concern for recurrence of cancer,05/2022 , underwent ultrasound-guided inguinal lymph node biopsy on 07/13/2022 by IR presents for follow-up with oncology to discuss plan of treatment and was found to have AKI on CKD with a creatinine of 6, potassium 5.5, and leukocytosis.  Blood pressure stable, oxygen sat 96 on room air.  Per the PA at cancer center she had a stent exchange recently.  -Accepted patient to telemetry -She will need IV fluids, Lokelma for hyperkalemia -She will need CT abdomen and pelvis without contrast to rule out stent obstruction -Karena Addison Diegel will contact urology and inform them of admission. -Please page urology when patient arrived to [facility], MD>

## 2022-07-26 NOTE — Progress Notes (Deleted)
{  Select_TRH_Note:[id number]} 

## 2022-07-26 NOTE — Assessment & Plan Note (Signed)
Continue pain control. Likely due to ureteral obstruction. CT abdomen and pelvis without contrast pending.

## 2022-07-26 NOTE — Assessment & Plan Note (Signed)
The patient is currently normotensive. Amlodipine and lasix will be held. Monitor.

## 2022-07-26 NOTE — Progress Notes (Signed)
[facility] [phone]   [phone]  PROGRESS NOTE  Patient Care Team: Sigmund Hazel, MD as PCP - General (Family Medicine) Hunsucker, Lesia Sago, MD as PCP - Pulmonology (Pulmonary Disease) [place] as Registered Nurse [facility]) Anthony Sar, MD as Consulting Physician (Nephrology)  Hematological/Oncological History # History of Bladder Cancer  2017: Initial diagnosis of bladder cancer. 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  No other evidence of residual/recurrent disease 06/06/2022: Establish care with Dr. Leonides Schanz 06/24/2022: PET scan showed interval development of hypermetabolic lymphadenopathy in inferior mediastinum, abdomen and pelvis. Additionally FDG accumulation in the skeleton raises concern for osseous metastatic disease versus stimulatory effects of chemotherapy.  07/14/2022: Underwent US guided biopsy of right inguinal lymph node that confirmed metastatic carcinoma from urothelial primary.   CHIEF COMPLAINTS/PURPOSE OF CONSULTATION:  Metastatic bladder cancer to the lymph nodes  HISTORY OF PRESENTING ILLNESS:  Shelia Cruz 72 y.o. female returns for a follow up with recent diagnosis of recurrent bladder cancer. She is accompanied by her daughter for this visit. Since the last visit with Dr. Leonides Schanz on 06/06/2022, she has undergone lymph node biopsy that confirmed recurrence.   On exam today Shelia Cruz is complaining of right lower quadrant abdominal pain that she rates as 10 out of 10 on a pain scale. She is currently taking tramadol 50 mg q 6 hours with minimal relief. She is hesitant to take  prescribed oxycodone as it caused her constipation in the past. She reports frequent episdoes of nausea and vomiting. She is taking zofran 4 mg as needed with minimal relief. Her appetite has decreased and she has lost 4 lbs since the last visit. She reports good output from her stoma and denies any signs of bleeding. She reports some brain fog and daughter adds that she has noticed some speech abnormalities. She denies any acute vision changes or neurological weakness.  She denies fevers, chills, sweats, shortness of breath, chest pain or cough. She has no other complaints. Rest of the 10 point ROS is below.   MEDICAL HISTORY:  Past Medical History:  Diagnosis Date   AKI (acute kidney injury) (HCC) 09/30/2019   Bladder cancer (HCC)    bladder cancer   GERD (gastroesophageal reflux disease)    Hypertension    Pancreatic mass    Renal disorder    Thyroid disease    Ureteral obstruction 09/30/2019   Vulvar pain 06/02/2021    SURGICAL HISTORY: Past Surgical History:  Procedure Laterality Date   ABDOMINAL HYSTERECTOMY     BREAST BIOPSY Left    benign   CHOLECYSTECTOMY     CYSTOSCOPY     IR EXT NEPHROURETERAL CATH EXCHANGE  09/30/2019   IR EXT NEPHROURETERAL CATH EXCHANGE  06/22/2022   KIDNEY SURGERY     NASAL SEPTUM SURGERY     REPLACEMENT TOTAL KNEE BILATERAL     RIGHT HEART CATH N/A 05/20/2021   Procedure: RIGHT HEART CATH;  Surgeon: Lyn Records, MD;  Location: [facility] INVASIVE CV LAB;  Service: Cardiovascular;  Laterality: N/A;    SOCIAL HISTORY: Social History   Socioeconomic History   Marital status: Single    Spouse name: Not on file   Number of children: Not on file   Years of education:  Not on file   Highest education level: Not on file  Occupational History   Not on file  Tobacco Use   Smoking status: Never   Smokeless tobacco: Never  Vaping Use   Vaping Use: Never used  Substance and Sexual Activity   Alcohol use: No   Drug use: No   Sexual activity: Not on file   Other Topics Concern   Not on file  Social History Narrative   Not on file   Social Determinants of Health   Financial Resource Strain: Not on file  Food Insecurity: Not on file  Transportation Needs: Not on file  Physical Activity: Not on file  Stress: Not on file  Social Connections: Not on file  Intimate Partner Violence: Not on file    FAMILY HISTORY: Family History  Problem Relation Age of Onset   Hypertension Mother    Diabetes Other     ALLERGIES:  is allergic to iodinated contrast media, sulfamethoxazole-trimethoprim, ciprofloxacin, and sulfacetamide sodium.  MEDICATIONS:  No current facility-administered medications for this visit.   No current outpatient medications on file.   Facility-Administered Medications Ordered in Other Visits  Medication Dose Route Frequency Provider Last Rate Last Admin   acetaminophen (TYLENOL) tablet 650 mg  650 mg Oral Q6H PRN Swayze, Ava, DO       Or   acetaminophen (TYLENOL) suppository 650 mg  650 mg Rectal Q6H PRN Swayze, Ava, DO       bisacodyl (DULCOLAX) suppository 10 mg  10 mg Rectal Daily PRN Swayze, Ava, DO       heparin injection 5,000 Units  5,000 Units Subcutaneous Q8H Swayze, Ava, DO       HYDROcodone-acetaminophen (NORCO/VICODIN) 5-325 MG per tablet 1-2 tablet  1-2 tablet Oral Q4H PRN Swayze, Ava, DO   1 tablet at 07/26/22 1821   HYDROmorphone (DILAUDID) injection 1 mg  1 mg Intravenous Q3H PRN Ray Church III, MD       ondansetron (ZOFRAN) tablet 4 mg  4 mg Oral Q6H PRN Swayze, Ava, DO       Or   ondansetron (ZOFRAN) injection 4 mg  4 mg Intravenous Q6H PRN Swayze, Ava, DO       polyethylene glycol (MIRALAX / GLYCOLAX) packet 17 g  17 g Oral Daily PRN Swayze, Ava, DO        REVIEW OF SYSTEMS:   Constitutional: ( - ) fevers, ( - )  chills , ( - ) night sweats Eyes: ( - ) blurriness of vision, ( - ) double vision, ( - ) watery eyes Ears, nose, mouth, throat, and face: ( - ) mucositis, ( - ) sore  throat Respiratory: ( - ) cough, ( - ) dyspnea, ( - ) wheezes Cardiovascular: ( - ) palpitation, ( - ) chest discomfort, ( - ) lower extremity swelling Gastrointestinal:  ( - ) nausea, ( - ) heartburn, ( - ) change in bowel habits Skin: ( - ) abnormal skin rashes Lymphatics: ( - ) new lymphadenopathy, ( - ) easy bruising Neurological: ( - ) numbness, ( - ) tingling, ( - ) new weaknesses Behavioral/Psych: ( - ) mood change, ( - ) new changes  All other systems were reviewed with the patient and are negative.  PHYSICAL EXAMINATION:  Vitals:   07/26/22 1318  BP: (!) 140/87  Pulse: 83  Resp: (!) 26  Temp: 97.9 F (36.6 C)  SpO2: 96%   Filed Weights   07/26/22 1318  Weight: 210  lb 9.6 oz (95.5 kg)    GENERAL: well appearing elderly Caucasian female in NAD  SKIN: skin color, texture, turgor are normal, no rashes or significant lesions EYES: conjunctiva are pink and non-injected, sclera clear LUNGS: clear to auscultation and percussion with normal breathing effort HEART: regular rate & rhythm and no murmurs and no lower extremity edema Musculoskeletal: no cyanosis of digits and no clubbing  PSYCH: alert & oriented x 3, fluent speech NEURO: no focal motor/sensory deficits  LABORATORY DATA:  I have reviewed the data as listed    Latest Ref Rng & Units 07/26/2022   12:50 PM 07/13/2022   11:47 AM 06/06/2022    1:59 PM  CBC  WBC 4.0 - 10.5 K/uL 15.2  12.2  13.7   Hemoglobin 12.0 - 15.0 g/dL 16.1  09.6  04.5   Hematocrit 36.0 - 46.0 % 39.8  44.4  43.3   Platelets 150 - 400 K/uL 604  515  521        Latest Ref Rng & Units 07/26/2022   12:50 PM 06/06/2022    1:59 PM 06/11/2021   11:13 AM  CMP  Glucose 70 - 99 mg/dL 409  811  94   BUN 8 - 23 mg/dL 72  30  53   Creatinine 0.44 - 1.00 mg/dL 9.14  7.82  9.56   Sodium 135 - 145 mmol/L 136  139  141   Potassium 3.5 - 5.1 mmol/L 5.5  3.9  5.1   Chloride 98 - 111 mmol/L 102  105  104   CO2 22 - 32 mmol/L 19  25  19    Calcium 8.9 - 10.3  mg/dL 8.4  9.2  9.8   Total Protein 6.5 - 8.1 g/dL 6.4  7.0    Total Bilirubin 0.3 - 1.2 mg/dL 0.3  0.6    Alkaline Phos 38 - 126 U/L 261  135    AST 15 - 41 U/L 14  13    ALT 0 - 44 U/L 19  11       RADIOGRAPHIC STUDIES: I have personally reviewed the radiological images as listed and agreed with the findings in the report. [place] CORE BIOPSY (LYMPH NODES)  Result Date: 07/13/2022 INDICATION: History of bladder cancer, now with hypermetabolic inguinal lymphadenopathy worrisome for metastatic disease. Please perform ultrasound-guided inguinal lymph node biopsy for tissue diagnostic purposes. EXAM: ULTRASOUND-GUIDED RIGHT INGUINAL LYMPH NODE BIOPSIED COMPARISON:  PET-CT-06/24/2022 MEDICATIONS: None ANESTHESIA/SEDATION: Moderate (conscious) sedation was employed during this procedure as administered by the Interventional Radiology RN. A total of Versed 1 mg and Fentanyl 75 mcg was administered intravenously. Moderate Sedation Time: 10 minutes. The patient's level of consciousness and vital signs were monitored continuously by radiology nursing throughout the procedure under my direct supervision. COMPLICATIONS: None immediate. TECHNIQUE: Informed written consent was obtained from the patient after a discussion of the risks, benefits and alternatives to treatment. Questions regarding the procedure were encouraged and answered. Initial ultrasound scanning demonstrated an approximately 3.0 x 1.3 x 1.5 cm malignant appearing right inguinal lymph node, correlating with the hypermetabolic lymph node seen on preceding PET-CT image 187, series 607. This lymph node was targeted for biopsy given location and sonographic window. An ultrasound image was saved for documentation purposes. The procedure was planned. A timeout was performed prior to the initiation of the procedure. The operative was prepped and draped in the usual sterile fashion, and a sterile drape was applied covering the operative field. A timeout was  performed prior  to the initiation of the procedure. Local anesthesia was provided with 1% lidocaine with epinephrine. Under direct ultrasound guidance, an 18 gauge core needle device was utilized to obtain to obtain 6 core needle biopsies of the malignant appearing right inguinal lymph node. The samples were placed in formalin and submitted to pathology. The needle was removed and superficial hemostasis was achieved with manual compression. Post procedure scan was negative for significant hematoma. A dressing was applied. The patient tolerated the procedure well without immediate postprocedural complication. IMPRESSION: Technically successful ultrasound guided biopsy of indeterminate though malignant appearing right inguinal lymph node. Electronically Signed   By: Simonne Come M.D.   On: 07/13/2022 15:18    ASSESSMENT & PLAN Shelia Cruz is a 71 y.o. female returns for a follow up for recurrent bladder cancer.  # Metastatic urothelial carcinoma involving the lymph nodes: --Oncologic history as above --02/18/2022: CT abdomen pelvis without contrast showed concern for pelvic/inguinal lymphadenopathy.  No other evidence of residual/recurrent disease --06/24/2022: PET scan showed interval development of hypermetabolic lymphadenopathy in inferior mediastinum, abdomen and pelvis. Additionally FDG accumulation in the skeleton raises concern for osseous metastatic disease versus stimulatory effects of chemotherapy.  --Underwent US guided biopsy of right inguinal lymph node on 07/13/2022 that confirmed metastatic carcinoma from urothelial primary.  --Discussed Dr. Derek Mound recommendations for single agent Pembrolizumab Rande Lawman). Reviewed dose, frequency and common side effects.  --Need to schedule chemo-education.  --No needed for port placement but can consider if patient has difficulty with peripheral IV access in the future. Marland Kitchen   #Acute kidney injury: --Creatinine levels have marked increased from  2.61 on 06/06/2022 to 6.96 today --Patient last underwent stent replacement on 06/22/2022. --Need to further evaluation with inpatient urology consult and CT imaging  #Right lower quadrant pain: --Patient reports as 10 out of 10 on pain scale today --Patient is currently taking tramadol 50 mg q 6 hours with minimal relief.  --Need to further evaluate abdominal pain while admitted with CT abdomen/pelvis --She is hesitant to resume oxycodone that was prescribed before due to constipation. Educated on taking stool softeners to prevent constipation. --If pain persists, we can send long-acting pain medication once renal function improves.   #Nausea/vomiting: --currently on zofran 4 mg PRN --will switch to zofran ODT 8 mg q 8 hours PRN.   #Brain fog/possible speech slurring: --Neuro exam was normal --We will request a consultation with Dr. Barbaraann Cao for further evaluation  No orders of the defined types were placed in this encounter.   All questions were answered. The patient knows to call the clinic with any problems, questions or concerns.  I have spent a total of 30 minutes minutes of face-to-face and non-face-to-face time, preparing to see the patient,  performing a medically appropriate examination, counseling and educating the patient, ordering medications, documenting clinical information in the electronic health record,  and care coordination.   Georga Kaufmann PA-C Dept of Hematology and Oncology [facility] at [facility] Phone: [phone]   07/26/2022 7:01 PM

## 2022-07-26 NOTE — Consult Note (Signed)
Urology Consult   Physician requesting consult: Ava Swayze, DO  Reason for consult: AKI, hx bladder cancer  History of Present Illness: Shelia Cruz is a 72 y.o. with a PMH of HTN, GERD, CKD, L UTUC s/p L nephroureterectomy in February 2020, and HG Ta + CIS bladder cancer s/p RC/IC in February 2021 c/b right ureteral obstruction managed with R bander stent who was admitted  for significant abdominal pain and AKI to 6.96 (b/l ~2.5-2.8) in a solitary kidney. Patient reports she has had severe diffuse abdominal pain for quite some time, as well as right flank pain. This has been accompanied by anorexia, nausea, vomiting, and weight loss. She has been having BM's but reports they are smaller than usual. She is taking colace.  She also reports gross hematuria per conduit recently. She notes there is some, albeit little, urine output per stoma.  Patient had a PET/CT on 06/27/22 that demonstrated hypermetabolic lymphadenopathy in the abdomen and pelvis c/f metastatic/recurrent urothelial cancer. Patient underwent US-guided LN biopsy with IR on 07/13/22 which confirmed metastatic disease. She presented today to Oncology for outpatient follow-up where she was noted to have an AKI and was directed for admission.  Shelia Cruz had her stent exchanged on 06/22/22 with IR. She has it exchanged q6-8 weeks. At that time, a retrograde pyelogram demonstrated irregular filling defect within the posterior upper pole calyx and infundibulum as well as irregular filling defect along the length of the right ureter from the UPJ to distal ureter.  Patient follows with Dr. Laverle Patter and was last seen in office on May 31, 2022. Her NephU and RC/IC were performed at [facility].  Past Medical History:  Diagnosis Date   AKI (acute kidney injury) (HCC) 09/30/2019   Bladder cancer (HCC)    bladder cancer   GERD (gastroesophageal reflux disease)    Hypertension    Pancreatic mass    Renal disorder    Thyroid disease     Ureteral obstruction 09/30/2019   Vulvar pain 06/02/2021    Past Surgical History:  Procedure Laterality Date   ABDOMINAL HYSTERECTOMY     BREAST BIOPSY Left    benign   CHOLECYSTECTOMY     CYSTOSCOPY     IR EXT NEPHROURETERAL CATH EXCHANGE  09/30/2019   IR EXT NEPHROURETERAL CATH EXCHANGE  06/22/2022   KIDNEY SURGERY     NASAL SEPTUM SURGERY     REPLACEMENT TOTAL KNEE BILATERAL     RIGHT HEART CATH N/A 05/20/2021   Procedure: RIGHT HEART CATH;  Surgeon: Lyn Records, MD;  Location: [facility] INVASIVE CV LAB;  Service: Cardiovascular;  Laterality: N/A;     Current Hospital Medications:  Home meds:  No current facility-administered medications on file prior to encounter.   Current Outpatient Medications on File Prior to Encounter  Medication Sig Dispense Refill   amLODipine (NORVASC) 2.5 MG tablet Take 1 tablet (2.5 mg total) by mouth daily. 30 tablet 0   gabapentin (NEURONTIN) 300 MG capsule Take 300 mg by mouth 2 (two) times daily as needed (pain).     levothyroxine (SYNTHROID) 150 MCG tablet Take 150 mcg by mouth daily before breakfast.     ondansetron (ZOFRAN-ODT) 8 MG disintegrating tablet Take 1 tablet (8 mg total) by mouth every 8 (eight) hours as needed for nausea or vomiting. 90 tablet 0   pantoprazole (PROTONIX) 40 MG tablet Take 1 tablet (40 mg total) by mouth 2 (two) times daily. (Patient taking differently: Take 80 mg by mouth daily.) 60  tablet 0   Probiotic Product (ALIGN) 4 MG CAPS Take 1 capsule by mouth daily.     Propylene Glycol (SYSTANE COMPLETE) 0.6 % SOLN Place 1 drop into both eyes daily as needed (dry eyes).     rOPINIRole (REQUIP) 2 MG tablet Take 1 tablet (2 mg total) by mouth at bedtime as needed (restless legs). (Patient taking differently: Take 2 mg by mouth at bedtime.) 30 tablet 0   sertraline (ZOLOFT) 100 MG tablet Take 100 mg by mouth daily.     traMADol (ULTRAM) 50 MG tablet Take 50 mg by mouth as needed for moderate pain or severe pain.        Scheduled Meds:  heparin  5,000 Units Subcutaneous Q8H   Continuous Infusions: PRN Meds:.acetaminophen **OR** acetaminophen, bisacodyl, HYDROcodone-acetaminophen, HYDROmorphone (DILAUDID) injection, ondansetron **OR** ondansetron (ZOFRAN) IV, polyethylene glycol  Allergies:  Allergies  Allergen Reactions   Iodinated Contrast Media Other (See Comments)    Was told to avoid after being diagnosed with stage 3 kidney disease   Sulfamethoxazole-Trimethoprim Swelling    Facial swelling and rash   Ciprofloxacin Nausea And Vomiting   Sulfacetamide Sodium Swelling    Family History  Problem Relation Age of Onset   Hypertension Mother    Diabetes Other     Social History:  reports that she has never smoked. She has never used smokeless tobacco. She reports that she does not drink alcohol and does not use drugs.  ROS: A complete review of systems was performed.  All systems are negative except for pertinent findings as noted.  Physical Exam:  Vital signs in last 24 hours: Temp:  [97.9 F (36.6 C)-98.1 F (36.7 C)] 98.1 F (36.7 C) (05/07 1704) Pulse Rate:  [79-83] 79 (05/07 1704) Resp:  [19-26] 19 (05/07 1704) BP: (130-140)/(74-87) 130/74 (05/07 1704) SpO2:  [96 %] 96 % (05/07 1704) Weight:  [95.5 kg] 95.5 kg (05/07 1318) Constitutional:  Alert and oriented, No acute distress Cardiovascular: Regular rate and rhythm, No JVD Respiratory: Normal respiratory effort, Lungs clear bilaterally GI: Abdomen is soft, diffusely tender to palpation throughout the lower abdomen. Non-peritonitic. Stoma in RLQ with dark amber urine output, bander stent visible GU: + R flank tenderness Lymphatic: No lymphadenopathy Neurologic: Grossly intact, no focal deficits Psychiatric: Normal mood and affect  Laboratory Data:  Recent Labs    07/26/22 1250  WBC 15.2*  HGB 12.7  HCT 39.8  PLT 604*    Recent Labs    07/26/22 1250  NA 136  K 5.5*  CL 102  GLUCOSE 117*  BUN 72*  CALCIUM 8.4*   CREATININE 6.96*     Results for orders placed or performed in visit on 07/26/22 (from the past 24 hour(s))  CMP (Cancer Center only)     Status: Abnormal   Collection Time: 07/26/22 12:50 PM  Result Value Ref Range   Sodium 136 135 - 145 mmol/L   Potassium 5.5 (H) 3.5 - 5.1 mmol/L   Chloride 102 98 - 111 mmol/L   CO2 19 (L) 22 - 32 mmol/L   Glucose, Bld 117 (H) 70 - 99 mg/dL   BUN 72 (H) 8 - 23 mg/dL   Creatinine 1.61 (H) 0.96 - 1.00 mg/dL   Calcium 8.4 (L) 8.9 - 10.3 mg/dL   Total Protein 6.4 (L) 6.5 - 8.1 g/dL   Albumin 3.5 3.5 - 5.0 g/dL   AST 14 (L) 15 - 41 U/L   ALT 19 0 - 44 U/L   Alkaline Phosphatase  261 (H) 38 - 126 U/L   Total Bilirubin 0.3 0.3 - 1.2 mg/dL   GFR, Estimated 6 (L) >60 mL/min   Anion gap 15 5 - 15  CBC with Differential (Cancer Center Only)     Status: Abnormal   Collection Time: 07/26/22 12:50 PM  Result Value Ref Range   WBC Count 15.2 (H) 4.0 - 10.5 K/uL   RBC 4.54 3.87 - 5.11 MIL/uL   Hemoglobin 12.7 12.0 - 15.0 g/dL   HCT 62.9 52.8 - 41.3 %   MCV 87.7 80.0 - 100.0 fL   MCH 28.0 26.0 - 34.0 pg   MCHC 31.9 30.0 - 36.0 g/dL   RDW 24.4 01.0 - 27.2 %   Platelet Count 604 (H) 150 - 400 K/uL   nRBC 0.0 0.0 - 0.2 %   Neutrophils Relative % 85 %   Neutro Abs 12.9 (H) 1.7 - 7.7 K/uL   Lymphocytes Relative 5 %   Lymphs Abs 0.8 0.7 - 4.0 K/uL   Monocytes Relative 5 %   Monocytes Absolute 0.8 0.1 - 1.0 K/uL   Eosinophils Relative 2 %   Eosinophils Absolute 0.3 0.0 - 0.5 K/uL   Basophils Relative 1 %   Basophils Absolute 0.1 0.0 - 0.1 K/uL   Immature Granulocytes 2 %   Abs Immature Granulocytes 0.29 (H) 0.00 - 0.07 K/uL   No results found for this or any previous visit (from the past 240 hour(s)).  Renal Function: Recent Labs    07/26/22 1250  CREATININE 6.96*   Estimated Creatinine Clearance: 8.3 mL/min (A) (by C-G formula based on SCr of 6.96 mg/dL (H)).  Radiologic Imaging: No results found.  I independently reviewed the above imaging  studies.  Impression/Recommendation: 17F with PMH of L UTUC s/p L NephU and HG TA + CIS bladder cancer s/p RC/IC c/b newly diagnosed metastasis and right ureteral obstruction managed with serial right bander stent exchanges who presented with AKI in a solitary kidney. Her presentation and recent radiology findings from recent stent exchange is concerning for malignant stent obstruction from metastatic/recurrent urothelial disease.  - Please obtain STAT CT A/P to eval AKI and abdominal pain - If evidence of stent obstruction, patient will need IR consult for consideration of stent exchange versus R PCN (more likely) - Please keep patient NPO at this time - Analgesia for pain r/t mets - Urology will continue to follow  Carlus Pavlov 07/26/2022, 5:53 PM

## 2022-07-26 NOTE — Plan of Care (Signed)

## 2022-07-26 NOTE — Assessment & Plan Note (Signed)
Continue Requip. 

## 2022-07-26 NOTE — H&P (Signed)
History and Physical    Patient: Shelia Cruz DOB: 15-Nov-1950 DOA: 07/26/2022 DOS: the patient was seen and examined on 07/26/2022 PCP: Sigmund Hazel, MD  Patient coming from: Home  Chief Complaint: Worsening chronic abdominal pain with new right flank pain.  HPI: Shelia Cruz is a 72 y.o. female with medical history significant of Bladder cancer with new evidence of metastasis with new pelvic lymphadenopathy.  The bladder cancer was diagnosed in 2017.  He subsequently underwent nephro ureterectomy as well as radical cystectomy in February 2021 for recurrent nonresponsive CIS,  and high-grade TA disease. The patient has an ileal conduit. She was recently found to have enlarged pelvis lymph nodes and concern for recurrence of cancer,05/2022. She has undergone ultrasound-guided inguinal lymph node biopsy on 07/13/2022 by IR. She has presented for follow-up with oncology to discuss plan of treatment and was found to have AKI on CKD with a creatinine of 6, potassium 5.5, and leukocytosis.  Blood pressure stable, oxygen sat 96 on room air.  Per the PA at cancer center she had a stent exchange recently. The patient's baseline creatinine is   Urology has been consulted, and the patient have a CTA bdomen and pelvis without contrast. She will receive IV fluids and lokelma as well as pain control and antiemetics. Urology has seen the patient. She will be kept NPO after midnight as she will likely require stenting or other intervention in the am.  Other than bladder cancer the patient carries past medical history significant for GERD, HTN, hypothyroidism, ureteral obstruction - s/p nephrostomy tube and ileal conduit.   She denies fevers, chills, cough, shortness of breath, or chest pain. No hematochezia, melena, coffee ground emesis, or hematemesis. No constipation or diarrhea. No neurological changes, confusion, lateralizing weakness, or visual changes.  She will be admitted to a  telemetry bed. Urology has been consulted. She will receive IV fluids and lokelma. Electrolytes, volume status and creatinine will be followed.  Review of Systems: As mentioned in the history of present illness. All other systems reviewed and are negative. Past Medical History:  Diagnosis Date   AKI (acute kidney injury) (HCC) 09/30/2019   Bladder cancer (HCC)    bladder cancer   GERD (gastroesophageal reflux disease)    Hypertension    Pancreatic mass    Renal disorder    Thyroid disease    Ureteral obstruction 09/30/2019   Vulvar pain 06/02/2021   Past Surgical History:  Procedure Laterality Date   ABDOMINAL HYSTERECTOMY     BREAST BIOPSY Left    benign   CHOLECYSTECTOMY     CYSTOSCOPY     IR EXT NEPHROURETERAL CATH EXCHANGE  09/30/2019   IR EXT NEPHROURETERAL CATH EXCHANGE  06/22/2022   KIDNEY SURGERY     NASAL SEPTUM SURGERY     REPLACEMENT TOTAL KNEE BILATERAL     RIGHT HEART CATH N/A 05/20/2021   Procedure: RIGHT HEART CATH;  Surgeon: Lyn Records, MD;  Location: [facility] INVASIVE CV LAB;  Service: Cardiovascular;  Laterality: N/A;   Social History:  reports that she has never smoked. She has never used smokeless tobacco. She reports that she does not drink alcohol and does not use drugs.  Allergies  Allergen Reactions   Iodinated Contrast Media Other (See Comments)    Was told to avoid after being diagnosed with stage 3 kidney disease   Sulfamethoxazole-Trimethoprim Swelling    Facial swelling and rash   Ciprofloxacin Nausea And Vomiting   Sulfacetamide Sodium Swelling  Family History  Problem Relation Age of Onset   Hypertension Mother    Diabetes Other     Prior to Admission medications   Medication Sig Start Date End Date Taking? Authorizing Provider  amLODipine (NORVASC) 2.5 MG tablet Take 1 tablet (2.5 mg total) by mouth daily. 10/05/19  Yes Regalado, Belkys A, MD  gabapentin (NEURONTIN) 300 MG capsule Take 300 mg by mouth 2 (two) times daily as needed (pain).  12/22/20  Yes [provider]  levothyroxine (SYNTHROID) 150 MCG tablet Take 150 mcg by mouth daily before breakfast. 11/18/20  Yes [provider]  ondansetron (ZOFRAN-ODT) 8 MG disintegrating tablet Take 1 tablet (8 mg total) by mouth every 8 (eight) hours as needed for nausea or vomiting. 07/26/22  Yes Georga Kaufmann T, PA-C  pantoprazole (PROTONIX) 40 MG tablet Take 1 tablet (40 mg total) by mouth 2 (two) times daily. Patient taking differently: Take 80 mg by mouth daily. 10/04/19  Yes Regalado, Belkys A, MD  Probiotic Product (ALIGN) 4 MG CAPS Take 1 capsule by mouth daily.   Yes [provider]  Propylene Glycol (SYSTANE COMPLETE) 0.6 % SOLN Place 1 drop into both eyes daily as needed (dry eyes).   Yes [provider]  rOPINIRole (REQUIP) 2 MG tablet Take 1 tablet (2 mg total) by mouth at bedtime as needed (restless legs). Patient taking differently: Take 2 mg by mouth at bedtime. 10/04/19  Yes Regalado, Belkys A, MD  sertraline (ZOLOFT) 100 MG tablet Take 100 mg by mouth daily.   Yes [provider]  traMADol (ULTRAM) 50 MG tablet Take 50 mg by mouth as needed for moderate pain or severe pain.   Yes [provider]    Physical Exam: Vitals:   07/26/22 1704 07/26/22 2220  BP: 130/74 124/67  Pulse: 79 66  Resp: 19 18  Temp: 98.1 F (36.7 C) 98.2 F (36.8 C)  TempSrc: Oral Oral  SpO2: 96% 92%   Exam:  Constitutional:  The patient is awake, alert, and oriented x 3. She is tearful and anxious. She is in moderate distress from pain.  Eyes:  pupils and irises appear normal Normal lids and conjunctivae ENMT:  grossly normal hearing  Lips appear normal external ears, nose appear normal Oropharynx: mucosa, tongue,posterior pharynx appear normal Neck:  neck appears normal, no masses, normal ROM, supple no thyromegaly Respiratory:  No increased work of breathing. No wheezes, rales, or rhonchi No tactile fremitus Cardiovascular:   Regular rate and rhythm No murmurs, ectopy, or gallups. No lateral PMI. No thrills. Abdomen:  Abdomen is soft, non-distended with diffuse tenderness especially in right flank. No hernias, masses, or organomegaly Hypoactive bowel sounds.  Musculoskeletal:  No cyanosis, clubbing, or edema Skin:  No rashes, lesions, ulcers palpation of skin: no induration or nodules Neurologic:  CN 2-12 intact Sensation all 4 extremities intact Psychiatric:  Mental status Mood, affect appropriate Orientation to person, place, time  judgment and insight appear intact  Data Reviewed:    CBC    Component Value Date/Time   WBC 13.7 (H) 07/26/2022 2055   RBC 4.24 07/26/2022 2055   HGB 11.9 (L) 07/26/2022 2055   HGB 12.7 07/26/2022 1250   HCT 38.2 07/26/2022 2055   PLT 521 (H) 07/26/2022 2055   PLT 604 (H) 07/26/2022 1250   MCV 90.1 07/26/2022 2055   MCH 28.1 07/26/2022 2055   MCHC 31.2 07/26/2022 2055   RDW 14.1 07/26/2022 2055   LYMPHSABS 0.8 07/26/2022 1250   MONOABS  0.8 07/26/2022 1250   EOSABS 0.3 07/26/2022 1250   BASOSABS 0.1 07/26/2022 1250       Latest Ref Rng & Units 07/26/2022    8:55 PM 07/26/2022   12:50 PM 06/06/2022    1:59 PM  BMP  Glucose 70 - 99 mg/dL  161  096   BUN 8 - 23 mg/dL  72  30   Creatinine 0.45 - 1.00 mg/dL 4.09  8.11  9.14   Sodium 135 - 145 mmol/L  136  139   Potassium 3.5 - 5.1 mmol/L  5.5  3.9   Chloride 98 - 111 mmol/L  102  105   CO2 22 - 32 mmol/L  19  25   Calcium 8.9 - 10.3 mg/dL 8.3  8.4  9.2     Assessment and Plan: Acute renal failure superimposed on stage 3b chronic kidney disease (HCC) The patient's baseline creatinine appears to be in the 2.5 - 2.6 range. At this admission the patient's creatinine was 6.0 with a potassium of 5.5. This is likely due to dysfunction of her nephrostomy tube . She is receiving IV fluids and lokelma. Urology is consulted and CT abdomen and pelvis without contrast has been ordered. She only has one  kidney.  Essential hypertension The patient is currently normotensive. Amlodipine and lasix will be held. Monitor.  Hyperkalemia Due to AKI on CKD IV. Start lokelma. Monitor electrolytes and monitor on telemetry.  Metabolic acidosis Mild. Due to AKI on CKD IV. The patient is receiving cautious IV fluids. Monitor.  Nausea and vomiting Due to pain and AKI. Antiemetics ordered as well as IV fluids.  Restless legs syndrome Continue Requip.   Right flank pain Continue pain control. Likely due to ureteral obstruction. CT abdomen and pelvis without contrast pending.  Ureteral obstruction Patient recently had nephrostomy tube replaced. Urology following. She may require stenting.    Advance Care Planning:   Code Status: Full Code   Consults: Urology  Family Communication: None available  Severity of Illness: The appropriate patient status for this patient is INPATIENT. Inpatient status is judged to be reasonable and necessary in order to provide the required intensity of service to ensure the patient's safety. The patient's presenting symptoms, physical exam findings, and initial radiographic and laboratory data in the context of their chronic comorbidities is felt to place them at high risk for further clinical deterioration. Furthermore, it is not anticipated that the patient will be medically stable for discharge from the hospital within 2 midnights of admission.   * I certify that at the point of admission it is my clinical judgment that the patient will require inpatient hospital care spanning beyond 2 midnights from the point of admission due to high intensity of service, high risk for further deterioration and high frequency of surveillance required.*  Author: Pranav Lince, DO 07/26/2022 10:26 PM  For on call review [email or web address].

## 2022-07-26 NOTE — Assessment & Plan Note (Signed)
Due to pain and AKI. Antiemetics ordered as well as IV fluids.

## 2022-07-27 ENCOUNTER — Encounter (HOSPITAL_COMMUNITY): Payer: Self-pay | Admitting: Internal Medicine

## 2022-07-27 DIAGNOSIS — N179 Acute kidney failure, unspecified: Secondary | ICD-10-CM

## 2022-07-27 DIAGNOSIS — Z8249 Family history of ischemic heart disease and other diseases of the circulatory system: Secondary | ICD-10-CM | POA: Diagnosis not present

## 2022-07-27 DIAGNOSIS — Z66 Do not resuscitate: Secondary | ICD-10-CM | POA: Diagnosis present

## 2022-07-27 DIAGNOSIS — Z936 Other artificial openings of urinary tract status: Secondary | ICD-10-CM | POA: Diagnosis not present

## 2022-07-27 DIAGNOSIS — E875 Hyperkalemia: Secondary | ICD-10-CM | POA: Diagnosis present

## 2022-07-27 DIAGNOSIS — N1832 Chronic kidney disease, stage 3b: Secondary | ICD-10-CM | POA: Diagnosis not present

## 2022-07-27 DIAGNOSIS — C7989 Secondary malignant neoplasm of other specified sites: Secondary | ICD-10-CM | POA: Diagnosis present

## 2022-07-27 DIAGNOSIS — N184 Chronic kidney disease, stage 4 (severe): Secondary | ICD-10-CM | POA: Diagnosis present

## 2022-07-27 DIAGNOSIS — I129 Hypertensive chronic kidney disease with stage 1 through stage 4 chronic kidney disease, or unspecified chronic kidney disease: Secondary | ICD-10-CM | POA: Diagnosis present

## 2022-07-27 DIAGNOSIS — Z91041 Radiographic dye allergy status: Secondary | ICD-10-CM | POA: Diagnosis not present

## 2022-07-27 DIAGNOSIS — Z90710 Acquired absence of both cervix and uterus: Secondary | ICD-10-CM | POA: Diagnosis not present

## 2022-07-27 DIAGNOSIS — Z833 Family history of diabetes mellitus: Secondary | ICD-10-CM | POA: Diagnosis not present

## 2022-07-27 DIAGNOSIS — Z79899 Other long term (current) drug therapy: Secondary | ICD-10-CM | POA: Diagnosis not present

## 2022-07-27 DIAGNOSIS — E039 Hypothyroidism, unspecified: Secondary | ICD-10-CM | POA: Diagnosis present

## 2022-07-27 DIAGNOSIS — N131 Hydronephrosis with ureteral stricture, not elsewhere classified: Secondary | ICD-10-CM | POA: Diagnosis present

## 2022-07-27 DIAGNOSIS — Z96653 Presence of artificial knee joint, bilateral: Secondary | ICD-10-CM | POA: Diagnosis present

## 2022-07-27 DIAGNOSIS — E872 Acidosis, unspecified: Secondary | ICD-10-CM

## 2022-07-27 DIAGNOSIS — D72828 Other elevated white blood cell count: Secondary | ICD-10-CM | POA: Diagnosis present

## 2022-07-27 DIAGNOSIS — Z7189 Other specified counseling: Secondary | ICD-10-CM

## 2022-07-27 DIAGNOSIS — Z882 Allergy status to sulfonamides status: Secondary | ICD-10-CM | POA: Diagnosis not present

## 2022-07-27 DIAGNOSIS — N135 Crossing vessel and stricture of ureter without hydronephrosis: Secondary | ICD-10-CM | POA: Diagnosis not present

## 2022-07-27 DIAGNOSIS — Z79890 Hormone replacement therapy (postmenopausal): Secondary | ICD-10-CM | POA: Diagnosis not present

## 2022-07-27 DIAGNOSIS — G2581 Restless legs syndrome: Secondary | ICD-10-CM | POA: Diagnosis present

## 2022-07-27 DIAGNOSIS — K219 Gastro-esophageal reflux disease without esophagitis: Secondary | ICD-10-CM | POA: Diagnosis present

## 2022-07-27 DIAGNOSIS — C679 Malignant neoplasm of bladder, unspecified: Secondary | ICD-10-CM | POA: Diagnosis present

## 2022-07-27 DIAGNOSIS — D631 Anemia in chronic kidney disease: Secondary | ICD-10-CM | POA: Diagnosis present

## 2022-07-27 DIAGNOSIS — Z881 Allergy status to other antibiotic agents status: Secondary | ICD-10-CM | POA: Diagnosis not present

## 2022-07-27 LAB — COMPREHENSIVE METABOLIC PANEL
ALT: 24 U/L (ref 0–44)
AST: 34 U/L (ref 15–41)
Albumin: 2.7 g/dL — ABNORMAL LOW (ref 3.5–5.0)
Alkaline Phosphatase: 250 U/L — ABNORMAL HIGH (ref 38–126)
Anion gap: 8 (ref 5–15)
BUN: 73 mg/dL — ABNORMAL HIGH (ref 8–23)
CO2: 21 mmol/L — ABNORMAL LOW (ref 22–32)
Calcium: 8.5 mg/dL — ABNORMAL LOW (ref 8.9–10.3)
Chloride: 107 mmol/L (ref 98–111)
Creatinine, Ser: 7.22 mg/dL — ABNORMAL HIGH (ref 0.44–1.00)
GFR, Estimated: 6 mL/min — ABNORMAL LOW (ref >60)
Glucose, Bld: 100 mg/dL — ABNORMAL HIGH (ref 70–99)
Potassium: 5.2 mmol/L — ABNORMAL HIGH (ref 3.5–5.1)
Sodium: 136 mmol/L (ref 135–145)
Total Bilirubin: 0.7 mg/dL (ref 0.3–1.2)
Total Protein: 6.1 g/dL — ABNORMAL LOW (ref 6.5–8.1)

## 2022-07-27 LAB — CBC
HCT: 37.6 % (ref 36.0–46.0)
Hemoglobin: 10.9 g/dL — ABNORMAL LOW (ref 12.0–15.0)
MCH: 26.8 pg (ref 26.0–34.0)
MCHC: 29 g/dL — ABNORMAL LOW (ref 30.0–36.0)
MCV: 92.4 fL (ref 80.0–100.0)
Platelets: 497 10*3/uL — ABNORMAL HIGH (ref 150–400)
RBC: 4.07 MIL/uL (ref 3.87–5.11)
RDW: 14.2 % (ref 11.5–15.5)
WBC: 11.4 10*3/uL — ABNORMAL HIGH (ref 4.0–10.5)
nRBC: 0 % (ref 0.0–0.2)

## 2022-07-27 NOTE — TOC Initial Note (Signed)
Transition of Care [facility]) - Initial/Assessment Note    Patient Details  Name: Shelia Cruz MRN: [id number] Date of Birth: 06/06/1950  Transition of Care [facility]) CM/SW Contact:    Durenda Guthrie, RN Phone Number: 07/27/2022, 3:55 PM  Clinical Narrative:                 Transition of Care [facility]) Department has reviewed patient and no TOC needs have been identified at this time. We will continue to monitor patient advancement through Interdisciplinary progressions and if new patient needs arise, please place a consult.        Patient Goals and CMS Choice            Expected Discharge Plan and Services                                              Prior Living Arrangements/Services                       Activities of Daily Living Home Assistive Devices/Equipment: Dan Humphreys (specify type) ADL Screening (condition at time of admission) Patient's cognitive ability adequate to safely complete daily activities?: Yes Is the patient deaf or have difficulty hearing?: Yes Does the patient have difficulty seeing, even when wearing glasses/contacts?: No Does the patient have difficulty concentrating, remembering, or making decisions?: No Patient able to express need for assistance with ADLs?: Yes Does the patient have difficulty dressing or bathing?: No Independently performs ADLs?: No Communication: Independent Dressing (OT): Needs assistance Is this a change from baseline?: Change from baseline, expected to last <3days Grooming: Independent Feeding: Independent Bathing: Needs assistance Is this a change from baseline?: Change from baseline, expected to last <3 days Toileting: Needs assistance Is this a change from baseline?: Change from baseline, expected to last <3 days In/Out Bed: Needs assistance Is this a change from baseline?: Change from baseline, expected to last <3 days Walks in Home: Independent Does the patient have difficulty walking or  climbing stairs?: Yes Weakness of Legs: Both Weakness of Arms/Hands: None  Permission Sought/Granted                  Emotional Assessment              Admission diagnosis:  AKI (acute kidney injury) (HCC) [N17.9] Patient Active Problem List   Diagnosis Date Noted   AKI (acute kidney injury) (HCC) 07/27/2022   Acute renal failure superimposed on stage 3b chronic kidney disease (HCC) 07/26/2022   Intermittent gross hematuria 02/18/2022   Right flank pain 02/18/2022   Depression, recurrent (HCC) 02/03/2022   Inadequate community resources 02/03/2022   Vulvar pain 06/02/2021   Pulmonary hypertension (HCC) 06/02/2021   Exertional dyspnea 05/20/2021   Depression, major, single episode, moderate (HCC) 05/17/2021   Palliative care encounter 05/10/2021   Chest pain at rest 05/10/2021   Nausea and vomiting 05/10/2021   Hyperkalemia 09/30/2019   History of bladder cancer 09/30/2019   History of left nephrectomy 09/30/2019   History of total cystectomy 09/30/2019   Metabolic acidosis 09/30/2019   Hemoptysis 09/30/2019   Hypothyroidism 09/30/2019   Restless legs syndrome 09/30/2019   Ureteral obstruction 09/30/2019   Acute blood loss anemia 09/30/2019   Chest tightness 08/13/2018   Hypoxia 08/13/2018   Hypomagnesemia 08/13/2018   Cancer (HCC)    Essential hypertension  GERD (gastroesophageal reflux disease)    PCP:  Sigmund Hazel, MD Pharmacy:   [facility] [address] [place] - [address] [place] [address] Phone: [phone] Fax: [phone]  [facility] [address] [place] [address] Phone: [phone] Fax: [phone]  [facility] - [place], [place] - [address] [place] [address] Phone: [phone] Fax: [phone]     Social Determinants of Health (SDOH) Social History: SDOH Screenings    Tobacco Use: Low Risk  (07/27/2022)   SDOH Interventions:     Readmission Risk Interventions     No data to display

## 2022-07-27 NOTE — Progress Notes (Signed)
PROGRESS NOTE  Shelia Cruz VWU:[id number] DOB: [birth year] DOA: 07/26/2022 PCP: Sigmund Hazel, MD   LOS: 1 day   Brief Narrative / Interim history: 72 year old female with history of bladder cancer status post left-sided nephro ureterectomy as well as radical cystectomy in 2021 for recurrent nonresponsive malignancy, ileal conduit, evidence of new metastasis, pelvic lymphadenopathy comes to the hospital with decreased urine output in her ileal conduit as well as worsening renal function on labs.  She recently had a stent exchange on the right side.  CT scan on admission with progressive pathologic mesenteric, retrocrural, bilateral inguinal lymphadenopathy consistent with widespread metastatic disease as well as right-sided hydronephrosis and mild perinephric inflammatory stranding which may relate to stent occlusion, reflux or infection.  Subjective / 24h Interval events: Complains of right-sided flank pain, no nausea, no vomiting  Assesement and Plan: Principal Problem:   Acute renal failure superimposed on stage 3b chronic kidney disease (HCC) Active Problems:   Essential hypertension   Hyperkalemia   Metabolic acidosis   Restless legs syndrome   Ureteral obstruction   Nausea and vomiting   Right flank pain  Principal problem Acute kidney injury on chronic kidney disease stage IV -baseline creatinine appears to be around 2.6 earlier this year.  Creatinine on admission 6.9, worse this morning at 7.2.  She is remarkably asymptomatic with this.  This is likely postobstructive in the setting of probable widespread metastatic malignancy in her pelvis versus stent malfunction.  A percutaneous nephrostomy tube has been offered, however patient is very hesitant to have further interventions.  Palliative care consulted  Active problems Goals of care -patient with longstanding bladder cancer status post multiple surgeries, with evidence of newly recurrent widespread metastatic disease in  her pelvis.  Now with renal failure and hyperkalemia.  She tells me that she is tired of all this interventions, feels like she is getting worse and has not quality of life left.  She wishes to transition to comfort, however has not had a chance to speak with her daughter.  Reiterates that she wishes for DNR/DNI.  Palliative care consulted  Hyperkalemia, metabolic acidosis -due to renal failure.  Keep on Lokelma  Metastatic bladder cancer -with recurrence  Hypothyroidism-continue Synthroid  Essential hypertension-continue amlodipine.  Normotensive  Normocytic anemia-of chronic disease  Leukocytosis-improving with fluids alone  Scheduled Meds:  amLODipine  2.5 mg Oral Daily   heparin  5,000 Units Subcutaneous Q8H   levothyroxine  150 mcg Oral Q0600   pantoprazole  80 mg Oral Daily   rOPINIRole  2 mg Oral QHS   sertraline  100 mg Oral Daily   sodium zirconium cyclosilicate  10 g Oral BID   Continuous Infusions:  sodium chloride 100 mL/hr at 07/27/22 0314   PRN Meds:.acetaminophen **OR** acetaminophen, bisacodyl, gabapentin, HYDROcodone-acetaminophen, HYDROmorphone (DILAUDID) injection, LORazepam, ondansetron **OR** ondansetron (ZOFRAN) IV, polyethylene glycol, polyvinyl alcohol  Current Outpatient Medications  Medication Instructions   amLODipine (NORVASC) 2.5 mg, Oral, Daily   gabapentin (NEURONTIN) 300 mg, Oral, 2 times daily PRN   levothyroxine (SYNTHROID) 150 mcg, Oral, Daily before breakfast   ondansetron (ZOFRAN-ODT) 8 mg, Oral, Every 8 hours PRN   pantoprazole (PROTONIX) 40 mg, Oral, 2 times daily   Probiotic Product (ALIGN) 4 MG CAPS 1 capsule, Oral, Daily   Propylene Glycol (SYSTANE COMPLETE) 0.6 % SOLN 1 drop, Both Eyes, Daily PRN   rOPINIRole (REQUIP) 2 mg, Oral, At bedtime PRN   sertraline (ZOLOFT) 100 mg, Oral, Daily   traMADol (ULTRAM) 50 mg, Oral, As  needed    Diet Orders (From admission, onward)     Start     Ordered   07/26/22 1742  Diet NPO time specified   Diet effective now        07/26/22 1741            DVT prophylaxis: heparin injection 5,000 Units Start: 07/26/22 2200   Lab Results  Component Value Date   PLT 497 (H) 07/27/2022      Code Status: Full Code  Family Communication: no family at bedside   Status is: Observation The patient will require care spanning > 2 midnights and should be moved to inpatient because: AKI, GOC  Level of care: Telemetry  Consultants:  Urology Palliative care  Objective: Vitals:   07/26/22 2347 07/27/22 0018 07/27/22 0503 07/27/22 1054  BP:  123/70 116/68 128/70  Pulse:  65 75   Resp:  20 18   Temp:  (!) 97.3 F (36.3 C) (!) 97.4 F (36.3 C)   TempSrc:  Oral Oral   SpO2:  98% 92%   Weight: 95.1 kg     Height: 5' 4.5" (1.638 m)       Intake/Output Summary (Last 24 hours) at 07/27/2022 1058 Last data filed at 07/27/2022 0314 Gross per 24 hour  Intake 350.96 ml  Output --  Net 350.96 ml   Wt Readings from Last 3 Encounters:  07/26/22 95.1 kg  07/26/22 95.5 kg  07/13/22 90.7 kg    Examination:  Constitutional: NAD Eyes: no scleral icterus ENMT: Mucous membranes are moist.  Neck: normal, supple Respiratory: clear to auscultation bilaterally, no wheezing, no crackles.  Cardiovascular: Regular rate and rhythm, no murmurs / rubs / gallops. No LE edema.  Abdomen: Diffuse abdominal tenderness, no guarding or rebound Musculoskeletal: no clubbing / cyanosis.    Data Reviewed: I have independently reviewed following labs and imaging studies   CBC Recent Labs  Lab 07/26/22 1250 07/26/22 2055 07/27/22 0545  WBC 15.2* 13.7* 11.4*  HGB 12.7 11.9* 10.9*  HCT 39.8 38.2 37.6  PLT 604* 521* 497*  MCV 87.7 90.1 92.4  MCH 28.0 28.1 26.8  MCHC 31.9 31.2 29.0*  RDW 14.2 14.1 14.2  LYMPHSABS 0.8  --   --   MONOABS 0.8  --   --   EOSABS 0.3  --   --   BASOSABS 0.1  --   --     Recent Labs  Lab 07/26/22 1250 07/26/22 2055 07/27/22 0545  NA 136  --  136  K 5.5*  --   5.2*  CL 102  --  107  CO2 19*  --  21*  GLUCOSE 117*  --  100*  BUN 72*  --  73*  CREATININE 6.96* 7.29* 7.22*  CALCIUM 8.4* 8.3* 8.5*  AST 14*  --  34  ALT 19  --  24  ALKPHOS 261*  --  250*  BILITOT 0.3  --  0.7  ALBUMIN 3.5  --  2.7*  MG  --  2.0  --   TSH 2.396  --   --     ------------------------------------------------------------------------------------------------------------------ No results for input(s): "CHOL", "HDL", "LDLCALC", "TRIG", "CHOLHDL", "LDLDIRECT" in the last 72 hours.  No results found for: "HGBA1C" ------------------------------------------------------------------------------------------------------------------ Recent Labs    07/26/22 1250  TSH 2.396    Cardiac Enzymes No results for input(s): "CKMB", "TROPONINI", "MYOGLOBIN" in the last 168 hours.  Invalid input(s): "CK" ------------------------------------------------------------------------------------------------------------------    Component Value Date/Time   BNP 71.1 08/12/2018  2306    CBG: No results for input(s): "GLUCAP" in the last 168 hours.  No results found for this or any previous visit (from the past 240 hour(s)).   Radiology Studies: CT ABDOMEN PELVIS WO CONTRAST  Result Date: 07/26/2022 CLINICAL DATA:  Acute renal failure, metastatic bladder cancer, mid abdominal pain EXAM: CT ABDOMEN AND PELVIS WITHOUT CONTRAST TECHNIQUE: Multidetector CT imaging of the abdomen and pelvis was performed following the standard protocol without IV contrast. RADIATION DOSE REDUCTION: This exam was performed according to the departmental dose-optimization program which includes automated exposure control, adjustment of the mA and/or kV according to patient size and/or use of iterative reconstruction technique. COMPARISON:  02/18/2022, PET CT 06/24/2022 FINDINGS: Lower chest: Stable 2 mm micronodule within the posterior basal right lower lobe, axial image # 23/4. No acute abnormality. Hepatobiliary:  No focal liver abnormality is seen. Status post cholecystectomy. No biliary dilatation. Pancreas: Unremarkable Spleen: Unremarkable Adrenals/Urinary Tract: Status post left nephroureterectomy and cystectomy. The adrenal glands are unremarkable. The residual right kidney is normal in size and position. Right retrograde ureteral stent extends from the right renal pelvis into the urostomy appliance. There is interval development of mild right hydronephrosis and mild peripelvic inflammatory stranding which may relate to underlying stent occlusion, reflux, and/or infection. No perinephric fluid collections are identified. Stomach/Bowel: Moderate pancolonic diverticulosis, most severe within the distal colon. The stomach, small bowel, and large bowel are otherwise unremarkable. Appendix normal. No free intraperitoneal gas or fluid. Interval development of pathologic mesenteric adenopathy since prior CT examination and progressive since prior PET CT examination with development of infiltrative soft tissue at the mesenteric root, likely a mesenteric implant. Vascular/Lymphatic: Mild aortoiliac atherosclerotic calcification. No aortic aneurysm. Extensive bilateral inguinal, external iliac left periaortic and retrocaval right retrocrural, and left paraesophageal lymphadenopathy is again identified in keeping with widespread nodal metastatic disease, demonstrated to be hypermetabolic on a prior PET CT examination. This appears progressive since prior examination. By example, left periaortic lymph node previously measured at 11 mm now measures 14 mm in short axis diameter at axial image # 37/2. Right inguinal lymph node previously measuring 12 mm now measures 16 mm at axial image # 75/2. Reproductive: Status post hysterectomy.  No adnexal mass. Other: No abdominal wall hernia. Progressive subcutaneous edema within the hypogastric anterior abdominal wall and right hip. Musculoskeletal: No acute bone abnormality. No lytic or  blastic bone lesion. IMPRESSION: 1. Status post left nephroureterectomy and cystectomy. 2. Right retrograde ureteral stent extending from the right renal pelvis into the urostomy appliance. Interval development of mild right hydronephrosis and mild peripelvic inflammatory stranding which may relate to underlying stent occlusion, reflux, and/or infection. 3. Progressive pathologic mesenteric, retrocrural, and bilateral inguinal lymphadenopathy in keeping with widespread nodal metastatic disease. Development of a mesenteric implant at the mesenteric root. 4. Progressive subcutaneous edema within the hypogastric anterior abdominal wall and right hip. This may relate to impingement of the left iliofemoral venous vasculature in the region of extensive pathologic adenopathy, however, patency is not well assessed on this noncontrast examination. 5. Moderate pancolonic diverticulosis without superimposed acute inflammatory change. Aortic Atherosclerosis (ICD10-I70.0). Electronically Signed   By: Helyn Numbers M.D.   On: 07/26/2022 20:36     Pamella Pert, MD, PhD [facility]  Between 7 am - 7 pm I am available, please contact me via Amion (for emergencies) or Securechat (non urgent messages)  Between 7 pm - 7 am I am not available, please contact night coverage MD/APP via Amion

## 2022-07-27 NOTE — Progress Notes (Signed)
Chaplain engaged in an initial visit with Shelia Cruz shared that she needed guidance on how to relay to her children that she does not desire any more interventions to prolong a lack of quality of life. Shelia Cruz was clear that right now her life has not yielded the quality she desires in that she can no longer drive anymore, be as mobile as she used to be, or take care of herself, home, or cat accordingly. Shelia Cruz desires to utilize hospice care services and begin the journey of dying with dignity. Shelia Cruz upholds that she knows this will be hard for her children, but that she has thought about this deeply. She does not want to continue on in the state she is in.   Shelia Cruz voiced that she would like a family meeting that will help relay this information to her children. She would also like to plan out what comes next. Chaplain mentioned Palliative Care and the ways they will assist her in being made comfortable through pain management and setting up possible hospice care services if she qualifies.   Shelia Cruz also mentioned that she has a number of documents such as DNR in her book bag in the room currently that signifies her wishes. Chaplain did not touch or look at these documents until [facility] can speak with a physician to clarify and outline needs and desires.   Chaplain offered prayer over Shelia Cruz who has been holding a lot of grief and emotion around her decision. Chaplain also offered reflective listening, compassionate presence, and support.   Chaplain Cantrell Larouche, MDiv  07/27/22 1000  Spiritual Encounters  Type of Visit Initial  Care provided to: Patient  Referral source Patient request  Reason for visit Routine spiritual support  Spiritual Framework  Presenting Themes Meaning/purpose/sources of inspiration;Goals in life/care;Significant life change;Community and relationships;Rituals and practive  Community/Connection Family  Family Stress Factors Loss of control;Major life changes  Interventions  Spiritual Care  Interventions Made Prayer;Encouragement;Supported grief process;Decision-making support/facilitation;Reflective listening;Compassionate presence;Established relationship of care and support;Explored values/beliefs/practices/strengths  Intervention Outcomes  Outcomes Awareness of support;Reduced anxiety;Reduced fear;Reduced isolation;Connected to spiritual community;Connection to spiritual care;Connection to values and goals of care

## 2022-07-27 NOTE — Progress Notes (Signed)
Subjective: No acute events overnight. Pain slightly improved but persistent in lower abdomen. UOP remains dark amber. Cr 7.22 from 6.96 on AM labs. Patient expressed interest in speaking with Hospice care today vs proceeding with PCN, states she does not have much longer as it is and is hesitant to have a nephrostomy tube.  Objective: Vital signs in last 24 hours: Temp:  [97.3 F (36.3 C)-98.2 F (36.8 C)] 97.4 F (36.3 C) (05/08 0503) Pulse Rate:  [65-83] 75 (05/08 0503) Resp:  [18-26] 18 (05/08 0503) BP: (116-140)/(67-87) 116/68 (05/08 0503) SpO2:  [92 %-98 %] 92 % (05/08 0503) Weight:  [95.1 kg-95.5 kg] 95.1 kg (05/07 2347)  Intake/Output from previous day: 05/07 0701 - 05/08 0700 In: 351 [I.V.:351] Out: -  Intake/Output this shift: No intake/output data recorded.  Physical Exam:  General: Alert and oriented CV: RRR Lungs: Clear Abdomen: Soft, ND GU: urostomy with dark amber urine output Ext: NT, No erythema  Lab Results: Recent Labs    07/26/22 1250 07/26/22 2055 07/27/22 0545  HGB 12.7 11.9* 10.9*  HCT 39.8 38.2 37.6   BMET Recent Labs    07/26/22 1250 07/26/22 2055 07/27/22 0545  NA 136  --  136  K 5.5*  --  5.2*  CL 102  --  107  CO2 19*  --  21*  GLUCOSE 117*  --  100*  BUN 72*  --  73*  CREATININE 6.96* 7.29* 7.22*  CALCIUM 8.4* 8.3* 8.5*     Studies/Results: CT ABDOMEN PELVIS WO CONTRAST  Result Date: 07/26/2022 CLINICAL DATA:  Acute renal failure, metastatic bladder cancer, mid abdominal pain EXAM: CT ABDOMEN AND PELVIS WITHOUT CONTRAST TECHNIQUE: Multidetector CT imaging of the abdomen and pelvis was performed following the standard protocol without IV contrast. RADIATION DOSE REDUCTION: This exam was performed according to the departmental dose-optimization program which includes automated exposure control, adjustment of the mA and/or kV according to patient size and/or use of iterative reconstruction technique. COMPARISON:  02/18/2022, PET  CT 06/24/2022 FINDINGS: Lower chest: Stable 2 mm micronodule within the posterior basal right lower lobe, axial image # 23/4. No acute abnormality. Hepatobiliary: No focal liver abnormality is seen. Status post cholecystectomy. No biliary dilatation. Pancreas: Unremarkable Spleen: Unremarkable Adrenals/Urinary Tract: Status post left nephroureterectomy and cystectomy. The adrenal glands are unremarkable. The residual right kidney is normal in size and position. Right retrograde ureteral stent extends from the right renal pelvis into the urostomy appliance. There is interval development of mild right hydronephrosis and mild peripelvic inflammatory stranding which may relate to underlying stent occlusion, reflux, and/or infection. No perinephric fluid collections are identified. Stomach/Bowel: Moderate pancolonic diverticulosis, most severe within the distal colon. The stomach, small bowel, and large bowel are otherwise unremarkable. Appendix normal. No free intraperitoneal gas or fluid. Interval development of pathologic mesenteric adenopathy since prior CT examination and progressive since prior PET CT examination with development of infiltrative soft tissue at the mesenteric root, likely a mesenteric implant. Vascular/Lymphatic: Mild aortoiliac atherosclerotic calcification. No aortic aneurysm. Extensive bilateral inguinal, external iliac left periaortic and retrocaval right retrocrural, and left paraesophageal lymphadenopathy is again identified in keeping with widespread nodal metastatic disease, demonstrated to be hypermetabolic on a prior PET CT examination. This appears progressive since prior examination. By example, left periaortic lymph node previously measured at 11 mm now measures 14 mm in short axis diameter at axial image # 37/2. Right inguinal lymph node previously measuring 12 mm now measures 16 mm at axial image # 75/2. Reproductive: Status  post hysterectomy.  No adnexal mass. Other: No abdominal  wall hernia. Progressive subcutaneous edema within the hypogastric anterior abdominal wall and right hip. Musculoskeletal: No acute bone abnormality. No lytic or blastic bone lesion. IMPRESSION: 1. Status post left nephroureterectomy and cystectomy. 2. Right retrograde ureteral stent extending from the right renal pelvis into the urostomy appliance. Interval development of mild right hydronephrosis and mild peripelvic inflammatory stranding which may relate to underlying stent occlusion, reflux, and/or infection. 3. Progressive pathologic mesenteric, retrocrural, and bilateral inguinal lymphadenopathy in keeping with widespread nodal metastatic disease. Development of a mesenteric implant at the mesenteric root. 4. Progressive subcutaneous edema within the hypogastric anterior abdominal wall and right hip. This may relate to impingement of the left iliofemoral venous vasculature in the region of extensive pathologic adenopathy, however, patency is not well assessed on this noncontrast examination. 5. Moderate pancolonic diverticulosis without superimposed acute inflammatory change. Aortic Atherosclerosis (ICD10-I70.0). Electronically Signed   By: Helyn Numbers M.D.   On: 07/26/2022 20:36    Assessment/Plan: 89F with PMH of L UTUC s/p L NephU and HG TA + CIS bladder cancer s/p RC/IC c/b newly diagnosed metastasis and right ureteral obstruction managed with serial right bander stent exchanges who presented with AKI in a solitary kidney. Her presentation and recent CT is concerning for malignant stent obstruction from metastatic/recurrent urothelial disease.   - Consult Hospice. Patient expressed interest in speaking with Hospice care and deferring PCN placement. We discussed that deferring PCN would certainly shorten her lifespan and she said she had come to peace with that; she is very hesitant to have a PCN - Following discussion with Hospice, inform Urology of patient's goals of care. If she elects to  proceed with PCN placement, would need IR consult - Analgesia for pain r/t mets - Urology will continue to follow   LOS: 1 day   Carlus Pavlov 07/27/2022, 8:17 AM

## 2022-07-27 NOTE — Plan of Care (Signed)

## 2022-07-28 ENCOUNTER — Other Ambulatory Visit: Payer: Self-pay | Admitting: Internal Medicine

## 2022-07-28 ENCOUNTER — Telehealth: Payer: Self-pay | Admitting: Physician Assistant

## 2022-07-28 ENCOUNTER — Other Ambulatory Visit (HOSPITAL_COMMUNITY): Payer: Self-pay

## 2022-07-28 DIAGNOSIS — N1832 Chronic kidney disease, stage 3b: Secondary | ICD-10-CM | POA: Diagnosis not present

## 2022-07-28 DIAGNOSIS — N179 Acute kidney failure, unspecified: Secondary | ICD-10-CM | POA: Diagnosis not present

## 2022-07-28 MED ORDER — OXYCODONE HCL 5 MG PO TABS: 5.0000 mg | ORAL_TABLET | Freq: Three times a day (TID) | ORAL | 0 refills | Status: AC | PRN

## 2022-07-28 MED ORDER — LORAZEPAM 0.5 MG PO TABS: 0.5000 mg | ORAL_TABLET | Freq: Four times a day (QID) | ORAL | 0 refills | Status: DC | PRN

## 2022-07-28 MED ORDER — HYDROCODONE-ACETAMINOPHEN 5-325 MG PO TABS: 1.0000 | ORAL_TABLET | ORAL | 0 refills | Status: DC | PRN

## 2022-07-28 MED ORDER — LORAZEPAM 0.5 MG PO TABS: 0.5000 mg | ORAL_TABLET | Freq: Four times a day (QID) | ORAL | 0 refills | Status: AC | PRN

## 2022-07-28 MED ORDER — OXYCODONE HCL 5 MG PO TABS: 5.0000 mg | ORAL_TABLET | Freq: Three times a day (TID) | ORAL | 0 refills | Status: DC | PRN

## 2022-07-28 MED ORDER — HYDROCODONE-ACETAMINOPHEN 5-325 MG PO TABS
1.0000 | ORAL_TABLET | ORAL | 0 refills | Status: AC
  Filled 2022-07-28: qty 30, 3d supply, fill #0

## 2022-07-28 MED ORDER — LORAZEPAM 0.5 MG PO TABS
0.5000 mg | ORAL_TABLET | Freq: Four times a day (QID) | ORAL | 0 refills | Status: AC | PRN
  Filled 2022-07-28: qty 30, 8d supply, fill #0

## 2022-07-28 NOTE — TOC Progression Note (Signed)
Transition of Care [facility]) - Progression Note    Patient Details  Name: Shelia Cruz MRN: [id number] Date of Birth: [birth year]  Transition of Care [facility]) CM/SW Contact  Huston Foley Jacklynn Ganong, RN Phone Number: 07/28/2022, 9:12 AM  Clinical Narrative:    Case Manager spoke with patient this morning concerning discharge plan. She confirms that she wants to go home with Hospice services, requests [facility]. CM contacted [facility] Liaison with referral.  Patient states she has Rollator, cane, hand held shower head, shower seat, raised toilet seat. Patient's daughter Victorino Dike [phone], is with patient daily.  TOC Team will continue to follow.    Expected Discharge Plan: Home w Hospice Care Barriers to Discharge: No Barriers Identified  Expected Discharge Plan and Services   Discharge Planning Services: CM Consult Post Acute Care Choice: Hospice Living arrangements for the past 2 months: Apartment                 DME Arranged: N/A           HH [facility]) Date HH Agency Contacted: 07/28/22 Time HH Agency Contacted: [phone] Representative spoke with at [facility] Agency: Dionicio Stall   Social Determinants of Health (SDOH) Interventions SDOH Screenings   Tobacco Use: Low Risk  (07/27/2022)    Readmission Risk Interventions     No data to display

## 2022-07-28 NOTE — Discharge Summary (Addendum)
Physician Discharge Summary  Shelia Cruz LKG:[id number] DOB: 08-31-1950 DOA: 07/26/2022  PCP: Sigmund Hazel, MD  Admit date: 07/26/2022 Discharge date: 07/28/2022  Admitted From: home Disposition:  home with hospice  Recommendations for Outpatient Follow-up:  Follow up with home hospice  Home Health: hospice Equipment/Devices: none  Discharge Condition: stable CODE STATUS: DNR Diet Orders (From admission, onward)     Start     Ordered   07/27/22 1238  Diet regular Room service appropriate? Yes; Fluid consistency: Thin  Diet effective now       Question Answer Comment  Room service appropriate? Yes   Fluid consistency: Thin      07/27/22 1237            HPI: Per admitting MD, Shelia Cruz is a 72 y.o. female with medical history significant of Bladder cancer with new evidence of metastasis with new pelvic lymphadenopathy.  The bladder cancer was diagnosed in 2017.  He subsequently underwent nephro ureterectomy as well as radical cystectomy in February 2021 for recurrent nonresponsive CIS,  and high-grade TA disease. The patient has an ileal conduit. She was recently found to have enlarged pelvis lymph nodes and concern for recurrence of cancer,05/2022. She has undergone ultrasound-guided inguinal lymph node biopsy on 07/13/2022 by IR. She has presented for follow-up with oncology to discuss plan of treatment and was found to have AKI on CKD with a creatinine of 6, potassium 5.5, and leukocytosis.  Blood pressure stable, oxygen sat 96 on room air.  Per the PA at cancer center she had a stent exchange recently   Hospital Course / Discharge diagnoses: Principal Problem:   Acute renal failure superimposed on stage 3b chronic kidney disease (HCC) Active Problems:   Essential hypertension   Hyperkalemia   Metabolic acidosis   Restless legs syndrome   Ureteral obstruction   Nausea and vomiting   Right flank pain   AKI (acute kidney injury) (HCC)   Principal  problem Acute kidney injury on chronic kidney disease stage IV -baseline creatinine appears to be around 2.6 earlier this year.  Creatinine on admission 6.9, then worsening at 7.2.  She is remarkably asymptomatic with this.  This is likely postobstructive in the setting of probable widespread metastatic malignancy in her pelvis versus stent malfunction.  A percutaneous nephrostomy tube has been offered, however patient declined further interventions.  See goals of care below, will go home with hospice   Active problems Goals of care -patient with longstanding bladder cancer status post multiple surgeries, with evidence of newly recurrent widespread metastatic disease in her pelvis.  Now with renal failure and hyperkalemia.  She tells me that she is tired of all this interventions, feels like she is getting worse and has not quality of life left.  She wishes to transition to comfort, and wishes to be home.  Home hospice was arranged and she will be discharged today.  She is DNR/DNI Hyperkalemia, metabolic acidosis -due to renal failure. Poor prognosis Metastatic bladder cancer -with recurrence Hypothyroidism-continue Synthroid Essential hypertension-continue amlodipine.  Normotensive Normocytic anemia-of chronic disease Leukocytosis-improving with fluids alone  Sepsis ruled out   Discharge Instructions   Allergies as of 07/28/2022       Reactions   Iodinated Contrast Media Other (See Comments)   Was told to avoid after being diagnosed with stage 3 kidney disease   Sulfamethoxazole-trimethoprim Swelling   Facial swelling and rash   Ciprofloxacin Nausea And Vomiting   Sulfacetamide Sodium Swelling  Medication List     STOP taking these medications    traMADol 50 MG tablet Commonly known as: ULTRAM       TAKE these medications    Align 4 MG Caps Take 1 capsule by mouth daily.   amLODipine 2.5 MG tablet Commonly known as: NORVASC Take 1 tablet (2.5 mg total) by mouth  daily.   gabapentin 300 MG capsule Commonly known as: NEURONTIN Take 300 mg by mouth 2 (two) times daily as needed (pain).   levothyroxine 150 MCG tablet Commonly known as: SYNTHROID Take 150 mcg by mouth daily before breakfast.   LORazepam 0.5 MG tablet Commonly known as: ATIVAN Take 1 tablet (0.5 mg total) by mouth every 6 (six) hours as needed for anxiety.   ondansetron 8 MG disintegrating tablet Commonly known as: ZOFRAN-ODT Take 1 tablet (8 mg total) by mouth every 8 (eight) hours as needed for nausea or vomiting.   oxyCODONE 5 MG immediate release tablet Commonly known as: Roxicodone Take 1 tablet (5 mg total) by mouth every 8 (eight) hours as needed.   pantoprazole 40 MG tablet Commonly known as: PROTONIX Take 1 tablet (40 mg total) by mouth 2 (two) times daily. What changed:  how much to take when to take this   rOPINIRole 2 MG tablet Commonly known as: REQUIP Take 1 tablet (2 mg total) by mouth at bedtime as needed (restless legs). What changed: when to take this   sertraline 100 MG tablet Commonly known as: ZOLOFT Take 100 mg by mouth daily.   Systane Complete 0.6 % Soln Generic drug: Propylene Glycol Place 1 drop into both eyes daily as needed (dry eyes).        Follow-up Information     [facility] Follow up.   Specialty: Hospice and Palliative Medicine Why: You will be contacted with date and time for your services to be started. Contact information: [address] [place] [address] [phone]               Consultations: Urology   Procedures/Studies:  CT ABDOMEN PELVIS WO CONTRAST  Result Date: 07/26/2022 CLINICAL DATA:  Acute renal failure, metastatic bladder cancer, mid abdominal pain EXAM: CT ABDOMEN AND PELVIS WITHOUT CONTRAST TECHNIQUE: Multidetector CT imaging of the abdomen and pelvis was performed following the standard protocol without IV contrast. RADIATION DOSE REDUCTION: This exam was performed  according to the departmental dose-optimization program which includes automated exposure control, adjustment of the mA and/or kV according to patient size and/or use of iterative reconstruction technique. COMPARISON:  02/18/2022, PET CT 06/24/2022 FINDINGS: Lower chest: Stable 2 mm micronodule within the posterior basal right lower lobe, axial image # 23/4. No acute abnormality. Hepatobiliary: No focal liver abnormality is seen. Status post cholecystectomy. No biliary dilatation. Pancreas: Unremarkable Spleen: Unremarkable Adrenals/Urinary Tract: Status post left nephroureterectomy and cystectomy. The adrenal glands are unremarkable. The residual right kidney is normal in size and position. Right retrograde ureteral stent extends from the right renal pelvis into the urostomy appliance. There is interval development of mild right hydronephrosis and mild peripelvic inflammatory stranding which may relate to underlying stent occlusion, reflux, and/or infection. No perinephric fluid collections are identified. Stomach/Bowel: Moderate pancolonic diverticulosis, most severe within the distal colon. The stomach, small bowel, and large bowel are otherwise unremarkable. Appendix normal. No free intraperitoneal gas or fluid. Interval development of pathologic mesenteric adenopathy since prior CT examination and progressive since prior PET CT examination with development of infiltrative soft tissue at the mesenteric root,  likely a mesenteric implant. Vascular/Lymphatic: Mild aortoiliac atherosclerotic calcification. No aortic aneurysm. Extensive bilateral inguinal, external iliac left periaortic and retrocaval right retrocrural, and left paraesophageal lymphadenopathy is again identified in keeping with widespread nodal metastatic disease, demonstrated to be hypermetabolic on a prior PET CT examination. This appears progressive since prior examination. By example, left periaortic lymph node previously measured at 11 mm now  measures 14 mm in short axis diameter at axial image # 37/2. Right inguinal lymph node previously measuring 12 mm now measures 16 mm at axial image # 75/2. Reproductive: Status post hysterectomy.  No adnexal mass. Other: No abdominal wall hernia. Progressive subcutaneous edema within the hypogastric anterior abdominal wall and right hip. Musculoskeletal: No acute bone abnormality. No lytic or blastic bone lesion. IMPRESSION: 1. Status post left nephroureterectomy and cystectomy. 2. Right retrograde ureteral stent extending from the right renal pelvis into the urostomy appliance. Interval development of mild right hydronephrosis and mild peripelvic inflammatory stranding which may relate to underlying stent occlusion, reflux, and/or infection. 3. Progressive pathologic mesenteric, retrocrural, and bilateral inguinal lymphadenopathy in keeping with widespread nodal metastatic disease. Development of a mesenteric implant at the mesenteric root. 4. Progressive subcutaneous edema within the hypogastric anterior abdominal wall and right hip. This may relate to impingement of the left iliofemoral venous vasculature in the region of extensive pathologic adenopathy, however, patency is not well assessed on this noncontrast examination. 5. Moderate pancolonic diverticulosis without superimposed acute inflammatory change. Aortic Atherosclerosis (ICD10-I70.0). Electronically Signed   By: Helyn Numbers M.D.   On: 07/26/2022 20:36   [place] CORE BIOPSY (LYMPH NODES)  Result Date: 07/13/2022 INDICATION: History of bladder cancer, now with hypermetabolic inguinal lymphadenopathy worrisome for metastatic disease. Please perform ultrasound-guided inguinal lymph node biopsy for tissue diagnostic purposes. EXAM: ULTRASOUND-GUIDED RIGHT INGUINAL LYMPH NODE BIOPSIED COMPARISON:  PET-CT-06/24/2022 MEDICATIONS: None ANESTHESIA/SEDATION: Moderate (conscious) sedation was employed during this procedure as administered by the Interventional  Radiology RN. A total of Versed 1 mg and Fentanyl 75 mcg was administered intravenously. Moderate Sedation Time: 10 minutes. The patient's level of consciousness and vital signs were monitored continuously by radiology nursing throughout the procedure under my direct supervision. COMPLICATIONS: None immediate. TECHNIQUE: Informed written consent was obtained from the patient after a discussion of the risks, benefits and alternatives to treatment. Questions regarding the procedure were encouraged and answered. Initial ultrasound scanning demonstrated an approximately 3.0 x 1.3 x 1.5 cm malignant appearing right inguinal lymph node, correlating with the hypermetabolic lymph node seen on preceding PET-CT image 187, series 607. This lymph node was targeted for biopsy given location and sonographic window. An ultrasound image was saved for documentation purposes. The procedure was planned. A timeout was performed prior to the initiation of the procedure. The operative was prepped and draped in the usual sterile fashion, and a sterile drape was applied covering the operative field. A timeout was performed prior to the initiation of the procedure. Local anesthesia was provided with 1% lidocaine with epinephrine. Under direct ultrasound guidance, an 18 gauge core needle device was utilized to obtain to obtain 6 core needle biopsies of the malignant appearing right inguinal lymph node. The samples were placed in formalin and submitted to pathology. The needle was removed and superficial hemostasis was achieved with manual compression. Post procedure scan was negative for significant hematoma. A dressing was applied. The patient tolerated the procedure well without immediate postprocedural complication. IMPRESSION: Technically successful ultrasound guided biopsy of indeterminate though malignant appearing right inguinal lymph node. Electronically Signed  By: Simonne Come M.D.   On: 07/13/2022 15:18     Subjective: - no  chest pain, shortness of breath, no abdominal pain, nausea or vomiting.   Discharge Exam: BP (!) 144/85 (BP Location: Right Arm)   Pulse 72   Temp 98.2 F (36.8 C)   Resp 20   Ht 5' 4.5" (1.638 m)   Wt 95.1 kg   SpO2 100%   BMI 35.43 kg/m   General: Pt is alert, awake, not in acute distress Cardiovascular: RRR, S1/S2 +, no rubs, no gallops Respiratory: CTA bilaterally, no wheezing, no rhonchi Abdominal: Soft, NT, ND, bowel sounds + Extremities: no edema, no cyanosis    The results of significant diagnostics from this hospitalization (including imaging, microbiology, ancillary and laboratory) are listed below for reference.     Microbiology: No results found for this or any previous visit (from the past 240 hour(s)).   Labs: Basic Metabolic Panel: Recent Labs  Lab 07/26/22 1250 07/26/22 2055 07/27/22 0545  NA 136  --  136  K 5.5*  --  5.2*  CL 102  --  107  CO2 19*  --  21*  GLUCOSE 117*  --  100*  BUN 72*  --  73*  CREATININE 6.96* 7.29* 7.22*  CALCIUM 8.4* 8.3* 8.5*  MG  --  2.0  --   PHOS  --  6.4*  --    Liver Function Tests: Recent Labs  Lab 07/26/22 1250 07/27/22 0545  AST 14* 34  ALT 19 24  ALKPHOS 261* 250*  BILITOT 0.3 0.7  PROT 6.4* 6.1*  ALBUMIN 3.5 2.7*   CBC: Recent Labs  Lab 07/26/22 1250 07/26/22 2055 07/27/22 0545  WBC 15.2* 13.7* 11.4*  NEUTROABS 12.9*  --   --   HGB 12.7 11.9* 10.9*  HCT 39.8 38.2 37.6  MCV 87.7 90.1 92.4  PLT 604* 521* 497*   CBG: No results for input(s): "GLUCAP" in the last 168 hours. Hgb A1c No results for input(s): "HGBA1C" in the last 72 hours. Lipid Profile No results for input(s): "CHOL", "HDL", "LDLCALC", "TRIG", "CHOLHDL", "LDLDIRECT" in the last 72 hours. Thyroid function studies Recent Labs    07/26/22 1250  TSH 2.396   Urinalysis    Component Value Date/Time   COLORURINE YELLOW 11/23/2020 1950   APPEARANCEUR CLOUDY (A) 11/23/2020 1950   LABSPEC 1.020 11/23/2020 1950   PHURINE 6.0  11/23/2020 1950   GLUCOSEU NEGATIVE 11/23/2020 1950   HGBUR LARGE (A) 11/23/2020 1950   BILIRUBINUR NEGATIVE 11/23/2020 1950   KETONESUR NEGATIVE 11/23/2020 1950   PROTEINUR 100 (A) 11/23/2020 1950   NITRITE POSITIVE (A) 11/23/2020 1950   LEUKOCYTESUR LARGE (A) 11/23/2020 1950    FURTHER DISCHARGE INSTRUCTIONS:   Get Medicines reviewed and adjusted: Please take all your medications with you for your next visit with your Primary MD   Laboratory/radiological data: Please request your Primary MD to go over all hospital tests and procedure/radiological results at the follow up, please ask your Primary MD to get all Hospital records sent to his/her office.   In some cases, they will be blood work, cultures and biopsy results pending at the time of your discharge. Please request that your primary care M.D. goes through all the records of your hospital data and follows up on these results.   Also Note the following: If you experience worsening of your admission symptoms, develop shortness of breath, life threatening emergency, suicidal or homicidal thoughts you must seek medical attention immediately by  calling 911 or calling your MD immediately  if symptoms less severe.   You must read complete instructions/literature along with all the possible adverse reactions/side effects for all the Medicines you take and that have been prescribed to you. Take any new Medicines after you have completely understood and accpet all the possible adverse reactions/side effects.    Do not drive when taking Pain medications or sleeping medications (Benzodaizepines)   Do not take more than prescribed Pain, Sleep and Anxiety Medications. It is not advisable to combine anxiety,sleep and pain medications without talking with your primary care practitioner   Special Instructions: If you have smoked or chewed Tobacco  in the last 2 yrs please stop smoking, stop any regular Alcohol  and or any Recreational drug use.    Wear Seat belts while driving.   Please note: You were cared for by a hospitalist during your hospital stay. Once you are discharged, your primary care physician will handle any further medical issues. Please note that NO REFILLS for any discharge medications will be authorized once you are discharged, as it is imperative that you return to your primary care physician (or establish a relationship with a primary care physician if you do not have one) for your post hospital discharge needs so that they can reassess your need for medications and monitor your lab values.  Time coordinating discharge: 20 minutes  SIGNED:  Pamella Pert, MD, PhD 07/28/2022, 10:36 AM

## 2022-07-28 NOTE — Progress Notes (Signed)
Civil engineer, contracting [facility]) Hospital Liaison Note  Received request from Transitions of Care Manager, Darl Pikes, for hospice services at home after discharge. Chart and patient information under review by [facility] physician.    Spoke with daughter/Jennifer to initiate education related to hospice philosophy, services, and team approach to care. Victorino Dike verbalized understanding of information given. Per discussion, the plan is for patient to discharge home via POV once cleared to DC.    DME needs discussed. Patient has the following equipment in the home [employer] chair 3 in 1 Patient requests the following equipment for delivery: N/A  Address verified and is correct in the chart. Victorino Dike is the family member to contact to arrange time of equipment delivery.    Please send signed and completed DNR home with patient/family. Please provide prescriptions at discharge as needed to ensure ongoing symptom management.    [facility] information and contact numbers given to family & above information shared with TOC.   Please call with any questions/concerns.    Thank you for the opportunity to participate in this patient's care.   Eugenie Birks, MSW [facility] Liaison  [phone]

## 2022-07-28 NOTE — Progress Notes (Signed)
Patient ID: Shelia Cruz, female   DOB: 1951-01-21, 72 y.o.   MRN: [id number]  I have discussed events and situation with Dr. Alvester Morin and Dr. Delanna Ahmadi.  Patient is known to me.  I would recommend a palliative care consultation before any intervention considering that patient has refused dialysis previously and considering her metastatic disease, percutaneous nephrostomy placement would only likely be palliative to extend survival as patient is beyond curative intervention.  Will address further recommendations after palliative care has seen Ms. Seilhammer.

## 2022-07-29 NOTE — Progress Notes (Signed)
      Consult received. Chart reviewed. Note plan for discharge home today with hospice ([facility]).     Sherlean Foot, NP-C Palliative Medicine    Please call Palliative Medicine team phone with any questions [phone]. For individual providers please see AMION.   No charge

## 2022-08-11 ENCOUNTER — Other Ambulatory Visit: Payer: Self-pay | Admitting: Cardiovascular Disease

## 2022-08-11 DIAGNOSIS — R0602 Shortness of breath: Secondary | ICD-10-CM

## 2022-08-20 DEATH — deceased
